# Patient Record
Sex: Male | Born: 1935 | Race: White | Hispanic: No | Smoking: Never smoker
Health system: Southern US, Community
[De-identification: ages and names within clinical notes are randomized; demographics above are authoritative.]

## PROBLEM LIST (undated history)

## (undated) DIAGNOSIS — J329 Chronic sinusitis, unspecified: Secondary | ICD-10-CM

## (undated) DIAGNOSIS — Z95810 Presence of automatic (implantable) cardiac defibrillator: Secondary | ICD-10-CM

## (undated) DIAGNOSIS — R05 Cough: Secondary | ICD-10-CM

## (undated) DIAGNOSIS — Z7901 Long term (current) use of anticoagulants: Secondary | ICD-10-CM

## (undated) DIAGNOSIS — I4891 Unspecified atrial fibrillation: Secondary | ICD-10-CM

## (undated) DIAGNOSIS — I951 Orthostatic hypotension: Secondary | ICD-10-CM

## (undated) DIAGNOSIS — G4733 Obstructive sleep apnea (adult) (pediatric): Secondary | ICD-10-CM

## (undated) DIAGNOSIS — N183 Chronic kidney disease, stage 3 unspecified: Secondary | ICD-10-CM

## (undated) DIAGNOSIS — M1990 Unspecified osteoarthritis, unspecified site: Secondary | ICD-10-CM

## (undated) DIAGNOSIS — I428 Other cardiomyopathies: Secondary | ICD-10-CM

## (undated) DIAGNOSIS — I10 Essential (primary) hypertension: Secondary | ICD-10-CM

## (undated) HISTORY — DX: Other cardiomyopathies: I42.8

## (undated) HISTORY — DX: Unspecified atrial fibrillation: I48.91

## (undated) HISTORY — DX: Presence of automatic (implantable) cardiac defibrillator: Z95.810

## (undated) HISTORY — DX: Long term (current) use of anticoagulants: Z79.01

## (undated) HISTORY — DX: Orthostatic hypotension: I95.1

## (undated) HISTORY — DX: Chronic sinusitis, unspecified: J32.9

## (undated) HISTORY — PX: CHOLECYSTECTOMY: SHX55

## (undated) HISTORY — DX: Cough: R05

## (undated) HISTORY — DX: Unspecified osteoarthritis, unspecified site: M19.90

## (undated) HISTORY — DX: Obstructive sleep apnea (adult) (pediatric): G47.33

## (undated) HISTORY — PX: TONSILLECTOMY: SUR1361

---

## 1983-05-11 HISTORY — PX: POSTERIOR LAMINECTOMY / DECOMPRESSION LUMBAR SPINE: SUR740

## 2000-05-10 DIAGNOSIS — G4733 Obstructive sleep apnea (adult) (pediatric): Secondary | ICD-10-CM

## 2000-05-10 HISTORY — DX: Obstructive sleep apnea (adult) (pediatric): G47.33

## 2000-06-21 ENCOUNTER — Encounter: Payer: Self-pay | Admitting: Internal Medicine

## 2001-06-26 ENCOUNTER — Ambulatory Visit (HOSPITAL_COMMUNITY): Admission: RE | Admit: 2001-06-26 | Discharge: 2001-06-26 | Payer: Self-pay | Admitting: Cardiology

## 2002-04-12 ENCOUNTER — Other Ambulatory Visit: Admission: RE | Admit: 2002-04-12 | Discharge: 2002-04-12 | Payer: Self-pay | Admitting: Dermatology

## 2003-08-16 ENCOUNTER — Ambulatory Visit (HOSPITAL_COMMUNITY): Admission: RE | Admit: 2003-08-16 | Discharge: 2003-08-16 | Payer: Self-pay | Admitting: Family Medicine

## 2003-09-10 ENCOUNTER — Ambulatory Visit (HOSPITAL_COMMUNITY): Admission: RE | Admit: 2003-09-10 | Discharge: 2003-09-10 | Payer: Self-pay | Admitting: Internal Medicine

## 2005-01-26 ENCOUNTER — Ambulatory Visit: Payer: Self-pay | Admitting: *Deleted

## 2005-01-28 ENCOUNTER — Ambulatory Visit (HOSPITAL_COMMUNITY): Admission: RE | Admit: 2005-01-28 | Discharge: 2005-01-28 | Payer: Self-pay | Admitting: *Deleted

## 2005-01-28 ENCOUNTER — Ambulatory Visit: Payer: Self-pay | Admitting: Cardiology

## 2005-02-11 ENCOUNTER — Ambulatory Visit: Payer: Self-pay | Admitting: *Deleted

## 2005-12-03 ENCOUNTER — Ambulatory Visit (HOSPITAL_COMMUNITY): Admission: RE | Admit: 2005-12-03 | Discharge: 2005-12-03 | Payer: Self-pay | Admitting: Family Medicine

## 2006-02-09 ENCOUNTER — Ambulatory Visit: Payer: Self-pay | Admitting: Cardiology

## 2006-04-13 ENCOUNTER — Ambulatory Visit: Payer: Self-pay | Admitting: Cardiology

## 2007-02-27 ENCOUNTER — Ambulatory Visit (HOSPITAL_COMMUNITY): Admission: RE | Admit: 2007-02-27 | Discharge: 2007-02-27 | Payer: Self-pay | Admitting: Family Medicine

## 2007-04-24 ENCOUNTER — Ambulatory Visit (HOSPITAL_COMMUNITY): Admission: RE | Admit: 2007-04-24 | Discharge: 2007-04-24 | Payer: Self-pay | Admitting: Family Medicine

## 2007-04-28 ENCOUNTER — Ambulatory Visit: Payer: Self-pay | Admitting: Cardiology

## 2007-05-01 ENCOUNTER — Ambulatory Visit: Payer: Self-pay | Admitting: Internal Medicine

## 2007-05-01 ENCOUNTER — Ambulatory Visit (HOSPITAL_COMMUNITY): Admission: RE | Admit: 2007-05-01 | Discharge: 2007-05-01 | Payer: Self-pay | Admitting: Cardiology

## 2007-05-12 ENCOUNTER — Ambulatory Visit (HOSPITAL_COMMUNITY): Admission: RE | Admit: 2007-05-12 | Discharge: 2007-05-12 | Payer: Self-pay | Admitting: Cardiology

## 2007-05-15 ENCOUNTER — Ambulatory Visit: Payer: Self-pay | Admitting: Cardiology

## 2007-06-11 DIAGNOSIS — Z95810 Presence of automatic (implantable) cardiac defibrillator: Secondary | ICD-10-CM

## 2007-06-11 HISTORY — DX: Presence of automatic (implantable) cardiac defibrillator: Z95.810

## 2007-06-11 HISTORY — PX: CARDIAC DEFIBRILLATOR PLACEMENT: SHX171

## 2007-06-15 ENCOUNTER — Ambulatory Visit: Payer: Self-pay | Admitting: Cardiology

## 2007-06-20 ENCOUNTER — Ambulatory Visit: Payer: Self-pay | Admitting: Internal Medicine

## 2007-07-07 ENCOUNTER — Observation Stay (HOSPITAL_COMMUNITY): Admission: RE | Admit: 2007-07-07 | Discharge: 2007-07-08 | Payer: Self-pay | Admitting: Internal Medicine

## 2007-07-07 ENCOUNTER — Ambulatory Visit: Payer: Self-pay | Admitting: Internal Medicine

## 2007-07-18 ENCOUNTER — Ambulatory Visit: Payer: Self-pay | Admitting: Cardiology

## 2007-07-24 ENCOUNTER — Ambulatory Visit: Payer: Self-pay | Admitting: Internal Medicine

## 2007-08-08 ENCOUNTER — Ambulatory Visit: Payer: Self-pay | Admitting: Cardiology

## 2007-09-13 ENCOUNTER — Ambulatory Visit: Payer: Self-pay | Admitting: Cardiology

## 2007-10-10 ENCOUNTER — Ambulatory Visit: Payer: Self-pay | Admitting: Internal Medicine

## 2007-10-18 ENCOUNTER — Ambulatory Visit: Payer: Self-pay | Admitting: Cardiology

## 2007-12-28 ENCOUNTER — Ambulatory Visit: Payer: Self-pay | Admitting: Cardiology

## 2008-01-11 ENCOUNTER — Ambulatory Visit: Payer: Self-pay | Admitting: Cardiology

## 2008-01-30 ENCOUNTER — Ambulatory Visit: Payer: Self-pay | Admitting: Cardiology

## 2008-02-06 ENCOUNTER — Encounter: Payer: Self-pay | Admitting: Orthopedic Surgery

## 2008-02-07 ENCOUNTER — Ambulatory Visit: Payer: Self-pay | Admitting: Internal Medicine

## 2008-02-07 ENCOUNTER — Ambulatory Visit: Payer: Self-pay | Admitting: Orthopedic Surgery

## 2008-02-14 ENCOUNTER — Emergency Department (HOSPITAL_COMMUNITY): Admission: EM | Admit: 2008-02-14 | Discharge: 2008-02-14 | Payer: Self-pay | Admitting: Emergency Medicine

## 2008-02-27 ENCOUNTER — Ambulatory Visit: Payer: Self-pay | Admitting: Cardiology

## 2008-03-15 ENCOUNTER — Encounter (INDEPENDENT_AMBULATORY_CARE_PROVIDER_SITE_OTHER): Payer: Self-pay | Admitting: General Surgery

## 2008-03-15 ENCOUNTER — Inpatient Hospital Stay (HOSPITAL_COMMUNITY): Admission: RE | Admit: 2008-03-15 | Discharge: 2008-03-16 | Payer: Self-pay | Admitting: General Surgery

## 2008-05-06 ENCOUNTER — Ambulatory Visit: Payer: Self-pay | Admitting: Internal Medicine

## 2008-06-11 ENCOUNTER — Ambulatory Visit: Payer: Self-pay | Admitting: Cardiology

## 2008-07-05 ENCOUNTER — Encounter: Payer: Self-pay | Admitting: Internal Medicine

## 2008-07-10 ENCOUNTER — Ambulatory Visit: Payer: Self-pay | Admitting: Internal Medicine

## 2008-08-23 ENCOUNTER — Ambulatory Visit (HOSPITAL_COMMUNITY): Admission: RE | Admit: 2008-08-23 | Discharge: 2008-08-23 | Payer: Self-pay | Admitting: Family Medicine

## 2008-10-07 ENCOUNTER — Ambulatory Visit: Payer: Self-pay | Admitting: Internal Medicine

## 2008-10-17 ENCOUNTER — Encounter: Payer: Self-pay | Admitting: Internal Medicine

## 2008-11-14 ENCOUNTER — Encounter (INDEPENDENT_AMBULATORY_CARE_PROVIDER_SITE_OTHER): Payer: Self-pay | Admitting: *Deleted

## 2008-12-09 ENCOUNTER — Encounter: Payer: Self-pay | Admitting: Cardiology

## 2008-12-11 ENCOUNTER — Ambulatory Visit (HOSPITAL_COMMUNITY): Admission: RE | Admit: 2008-12-11 | Discharge: 2008-12-11 | Payer: Self-pay | Admitting: Cardiology

## 2008-12-11 ENCOUNTER — Ambulatory Visit: Payer: Self-pay | Admitting: Cardiology

## 2008-12-11 ENCOUNTER — Encounter: Payer: Self-pay | Admitting: Cardiology

## 2008-12-11 DIAGNOSIS — I428 Other cardiomyopathies: Secondary | ICD-10-CM | POA: Insufficient documentation

## 2008-12-11 DIAGNOSIS — I10 Essential (primary) hypertension: Secondary | ICD-10-CM | POA: Insufficient documentation

## 2008-12-11 DIAGNOSIS — M1990 Unspecified osteoarthritis, unspecified site: Secondary | ICD-10-CM | POA: Insufficient documentation

## 2008-12-12 ENCOUNTER — Encounter: Payer: Self-pay | Admitting: Cardiology

## 2008-12-16 ENCOUNTER — Encounter (INDEPENDENT_AMBULATORY_CARE_PROVIDER_SITE_OTHER): Payer: Self-pay

## 2008-12-18 ENCOUNTER — Ambulatory Visit: Payer: Self-pay | Admitting: Cardiology

## 2009-01-26 ENCOUNTER — Encounter (INDEPENDENT_AMBULATORY_CARE_PROVIDER_SITE_OTHER): Payer: Self-pay | Admitting: Pediatrics

## 2009-01-27 ENCOUNTER — Ambulatory Visit: Payer: Self-pay | Admitting: Internal Medicine

## 2009-01-31 ENCOUNTER — Ambulatory Visit: Payer: Self-pay | Admitting: Cardiology

## 2009-01-31 ENCOUNTER — Encounter (INDEPENDENT_AMBULATORY_CARE_PROVIDER_SITE_OTHER): Payer: Self-pay | Admitting: *Deleted

## 2009-01-31 ENCOUNTER — Encounter: Payer: Self-pay | Admitting: Internal Medicine

## 2009-01-31 LAB — CONVERTED CEMR LAB
ALT: 12 units/L (ref 0–53)
AST: 19 units/L (ref 0–37)
Albumin: 4.4 g/dL (ref 3.5–5.2)
Alkaline Phosphatase: 68 units/L
BUN: 34 mg/dL
CO2: 24 meq/L
Calcium: 9.6 mg/dL
Creatinine, Ser: 1.46 mg/dL
Creatinine, Ser: 1.46 mg/dL (ref 0.40–1.50)
Glucose, Bld: 90 mg/dL (ref 70–99)
Potassium: 4.9 meq/L
Total Bilirubin: 0.6 mg/dL (ref 0.3–1.2)

## 2009-02-04 ENCOUNTER — Ambulatory Visit: Payer: Self-pay | Admitting: Cardiology

## 2009-02-04 ENCOUNTER — Encounter: Payer: Self-pay | Admitting: Cardiology

## 2009-02-04 ENCOUNTER — Ambulatory Visit (HOSPITAL_COMMUNITY): Admission: RE | Admit: 2009-02-04 | Discharge: 2009-02-04 | Payer: Self-pay | Admitting: Cardiology

## 2009-02-07 ENCOUNTER — Encounter (INDEPENDENT_AMBULATORY_CARE_PROVIDER_SITE_OTHER): Payer: Self-pay | Admitting: *Deleted

## 2009-02-10 ENCOUNTER — Encounter (INDEPENDENT_AMBULATORY_CARE_PROVIDER_SITE_OTHER): Payer: Self-pay | Admitting: *Deleted

## 2009-04-09 DIAGNOSIS — I4891 Unspecified atrial fibrillation: Secondary | ICD-10-CM

## 2009-04-09 HISTORY — DX: Unspecified atrial fibrillation: I48.91

## 2009-04-27 ENCOUNTER — Encounter: Payer: Self-pay | Admitting: Internal Medicine

## 2009-04-28 ENCOUNTER — Ambulatory Visit: Payer: Self-pay | Admitting: Internal Medicine

## 2009-05-05 ENCOUNTER — Encounter: Payer: Self-pay | Admitting: Internal Medicine

## 2009-05-06 ENCOUNTER — Telehealth: Payer: Self-pay | Admitting: Internal Medicine

## 2009-07-16 ENCOUNTER — Ambulatory Visit: Payer: Self-pay | Admitting: Internal Medicine

## 2009-07-24 ENCOUNTER — Encounter (INDEPENDENT_AMBULATORY_CARE_PROVIDER_SITE_OTHER): Payer: Self-pay | Admitting: *Deleted

## 2009-07-29 ENCOUNTER — Ambulatory Visit: Payer: Self-pay | Admitting: Cardiology

## 2009-07-29 ENCOUNTER — Encounter (INDEPENDENT_AMBULATORY_CARE_PROVIDER_SITE_OTHER): Payer: Self-pay | Admitting: *Deleted

## 2009-08-01 ENCOUNTER — Encounter (INDEPENDENT_AMBULATORY_CARE_PROVIDER_SITE_OTHER): Payer: Self-pay | Admitting: *Deleted

## 2009-08-01 ENCOUNTER — Encounter: Payer: Self-pay | Admitting: Cardiology

## 2009-08-01 LAB — CONVERTED CEMR LAB
AST: 21 units/L
Alkaline Phosphatase: 66 units/L
Basophils Absolute: 0 10*3/uL
Calcium: 9.5 mg/dL
Cholesterol: 146 mg/dL
Creatinine, Ser: 1.32 mg/dL
Eosinophils Absolute: 0.2 10*3/uL
Glucose, Bld: 102 mg/dL
HCT: 40.7 %
Lymphocytes Relative: 35 %
Lymphs Abs: 2.5 10*3/uL
Monocytes Relative: 10 %
RDW: 13.1 %
Total Protein: 7.2 g/dL
Triglycerides: 109 mg/dL
WBC: 7.1 10*3/uL

## 2009-08-06 ENCOUNTER — Encounter: Payer: Self-pay | Admitting: Cardiology

## 2009-08-06 LAB — CONVERTED CEMR LAB
ALT: 14 units/L (ref 0–53)
AST: 21 units/L (ref 0–37)
Albumin: 4.5 g/dL (ref 3.5–5.2)
Alkaline Phosphatase: 66 units/L (ref 39–117)
Basophils Relative: 0 % (ref 0–1)
Calcium: 9.5 mg/dL (ref 8.4–10.5)
Chloride: 106 meq/L (ref 96–112)
Eosinophils Absolute: 0.2 10*3/uL (ref 0.0–0.7)
Eosinophils Relative: 2 % (ref 0–5)
HDL: 27 mg/dL — ABNORMAL LOW (ref >39)
Hemoglobin: 13.1 g/dL (ref 13.0–17.0)
LDL Cholesterol: 97 mg/dL (ref 0–99)
MCHC: 32.2 g/dL (ref 30.0–36.0)
MCV: 104.6 fL — ABNORMAL HIGH (ref 78.0–100.0)
Neutro Abs: 3.7 10*3/uL (ref 1.7–7.7)
Neutrophils Relative %: 52 % (ref 43–77)
Platelets: 218 10*3/uL (ref 150–400)
RBC: 3.89 M/uL — ABNORMAL LOW (ref 4.22–5.81)
RDW: 13.1 % (ref 11.5–15.5)
Total Bilirubin: 0.5 mg/dL (ref 0.3–1.2)
Triglycerides: 109 mg/dL (ref <150)
VLDL: 22 mg/dL (ref 0–40)

## 2009-08-11 ENCOUNTER — Encounter: Payer: Self-pay | Admitting: Cardiology

## 2009-08-11 ENCOUNTER — Encounter (INDEPENDENT_AMBULATORY_CARE_PROVIDER_SITE_OTHER): Payer: Self-pay

## 2009-08-12 ENCOUNTER — Telehealth: Payer: Self-pay | Admitting: Cardiology

## 2009-09-10 LAB — CONVERTED CEMR LAB
Calcium: 9.2 mg/dL (ref 8.4–10.5)
Chloride: 107 meq/L (ref 96–112)
Creatinine, Ser: 1.31 mg/dL (ref 0.40–1.50)
Potassium: 4.8 meq/L (ref 3.5–5.3)

## 2009-09-15 ENCOUNTER — Encounter (INDEPENDENT_AMBULATORY_CARE_PROVIDER_SITE_OTHER): Payer: Self-pay | Admitting: *Deleted

## 2009-09-17 ENCOUNTER — Encounter (INDEPENDENT_AMBULATORY_CARE_PROVIDER_SITE_OTHER): Payer: Self-pay | Admitting: *Deleted

## 2009-09-17 LAB — CONVERTED CEMR LAB
ALT: 13 units/L
AST: 20 units/L
AST: 20 units/L
Albumin: 4.4 g/dL
Alkaline Phosphatase: 71 units/L
Alkaline Phosphatase: 71 units/L
BUN: 26 mg/dL
CO2: 27 meq/L
Chloride: 108 meq/L
Cholesterol: 159 mg/dL
Cholesterol: 159 mg/dL
Creatinine, Ser: 1.19 mg/dL
Creatinine, Ser: 1.19 mg/dL
Glucose, Bld: 105 mg/dL
HCT: 40.8 %
HDL: 34 mg/dL
Hemoglobin: 12.6 g/dL
Hemoglobin: 12.6 g/dL
LDL Cholesterol: 101 mg/dL
MCV: 104.1 fL
MCV: 104.1 fL
Platelets: 214 10*3/uL
Platelets: 214 10*3/uL
Sodium: 144 meq/L
Total Protein: 7.2 g/dL
Triglycerides: 120 mg/dL
WBC: 6.3 10*3/uL

## 2009-10-11 IMAGING — CR DG CHEST 2V
2 series · 2 of 2 positions shown · non-contrast
Comparison: 05/12/07.

CLINICAL DATA: Left ventricular dysfunction status post pacer placement. 
 CHEST - 2 VIEW:

[w chest pa]
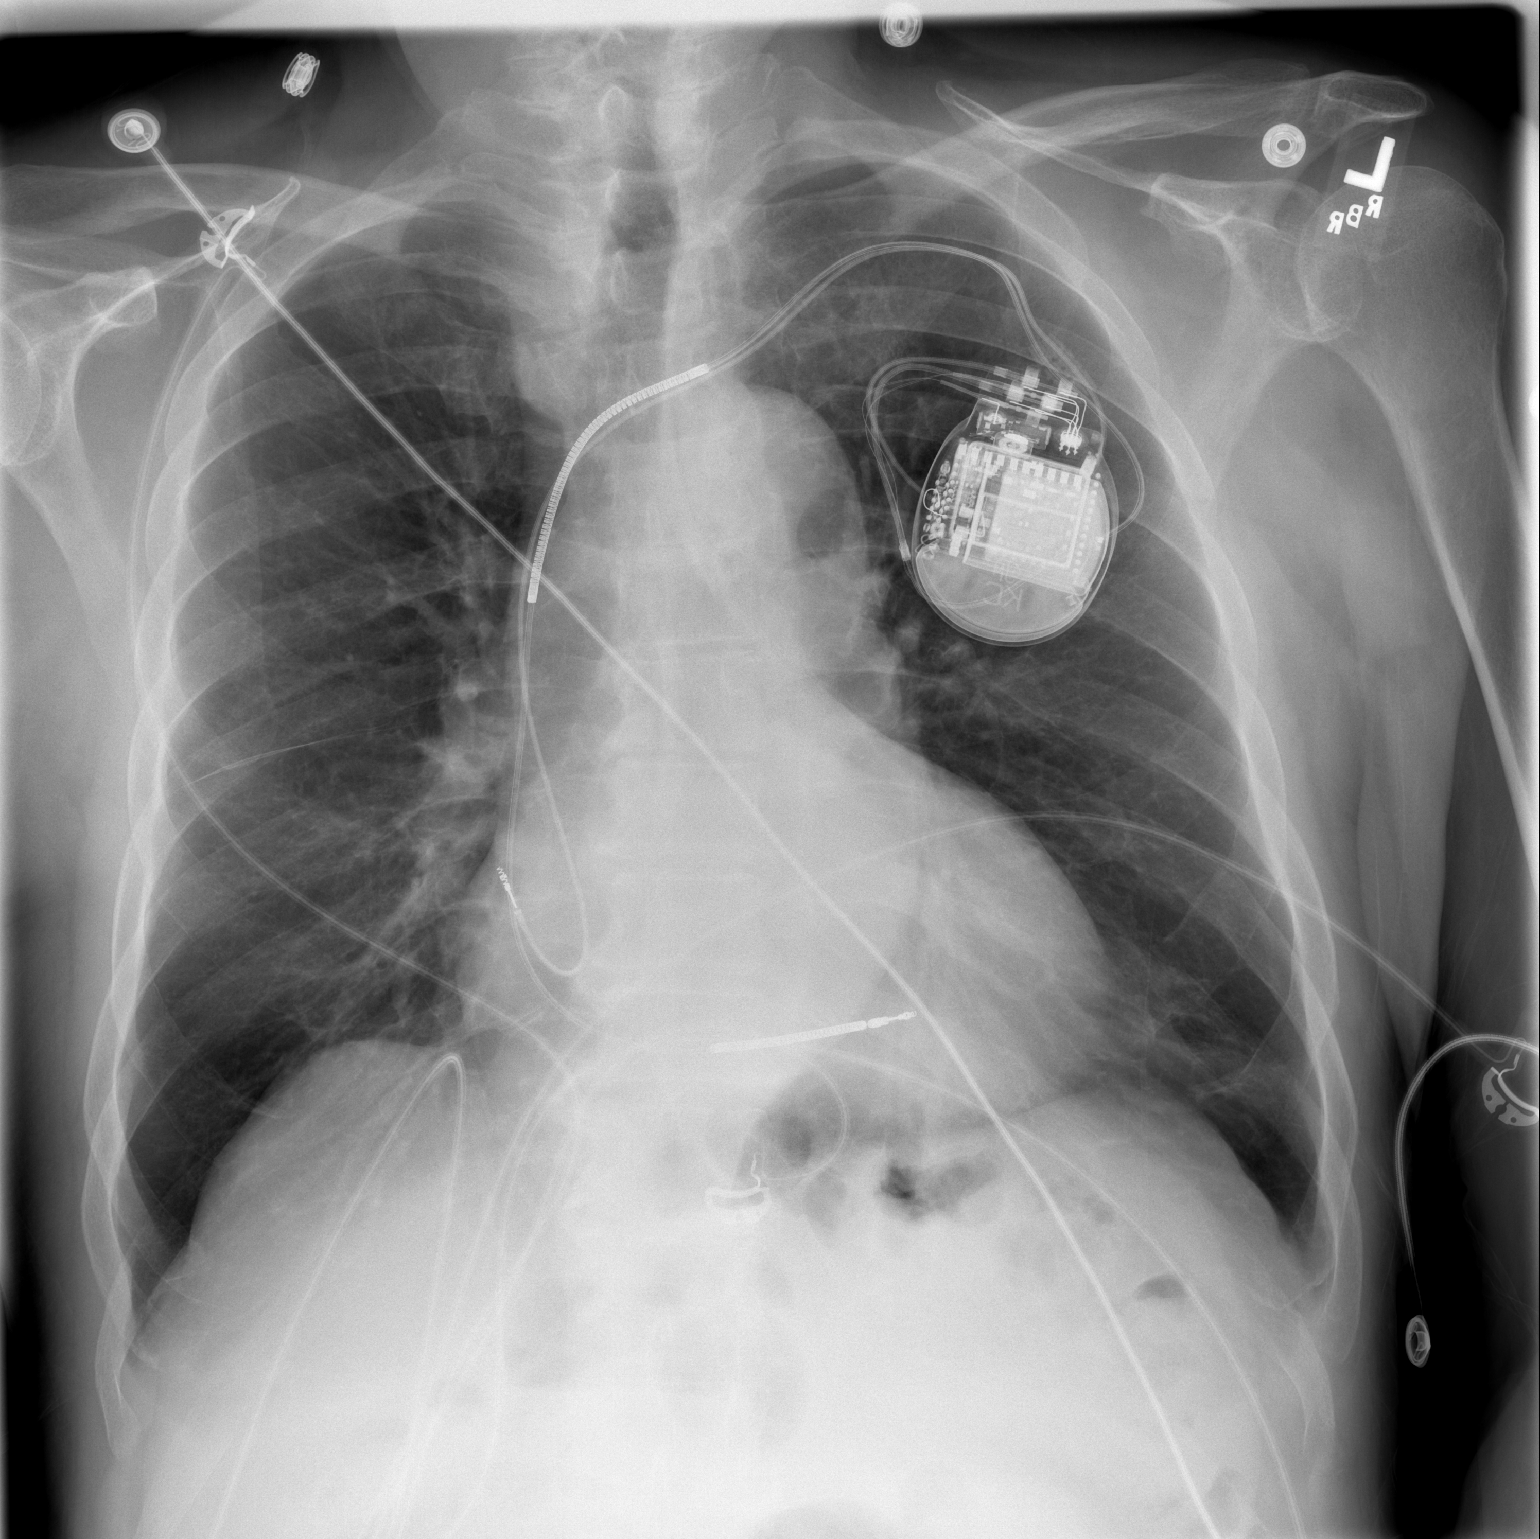

[w chest lat]
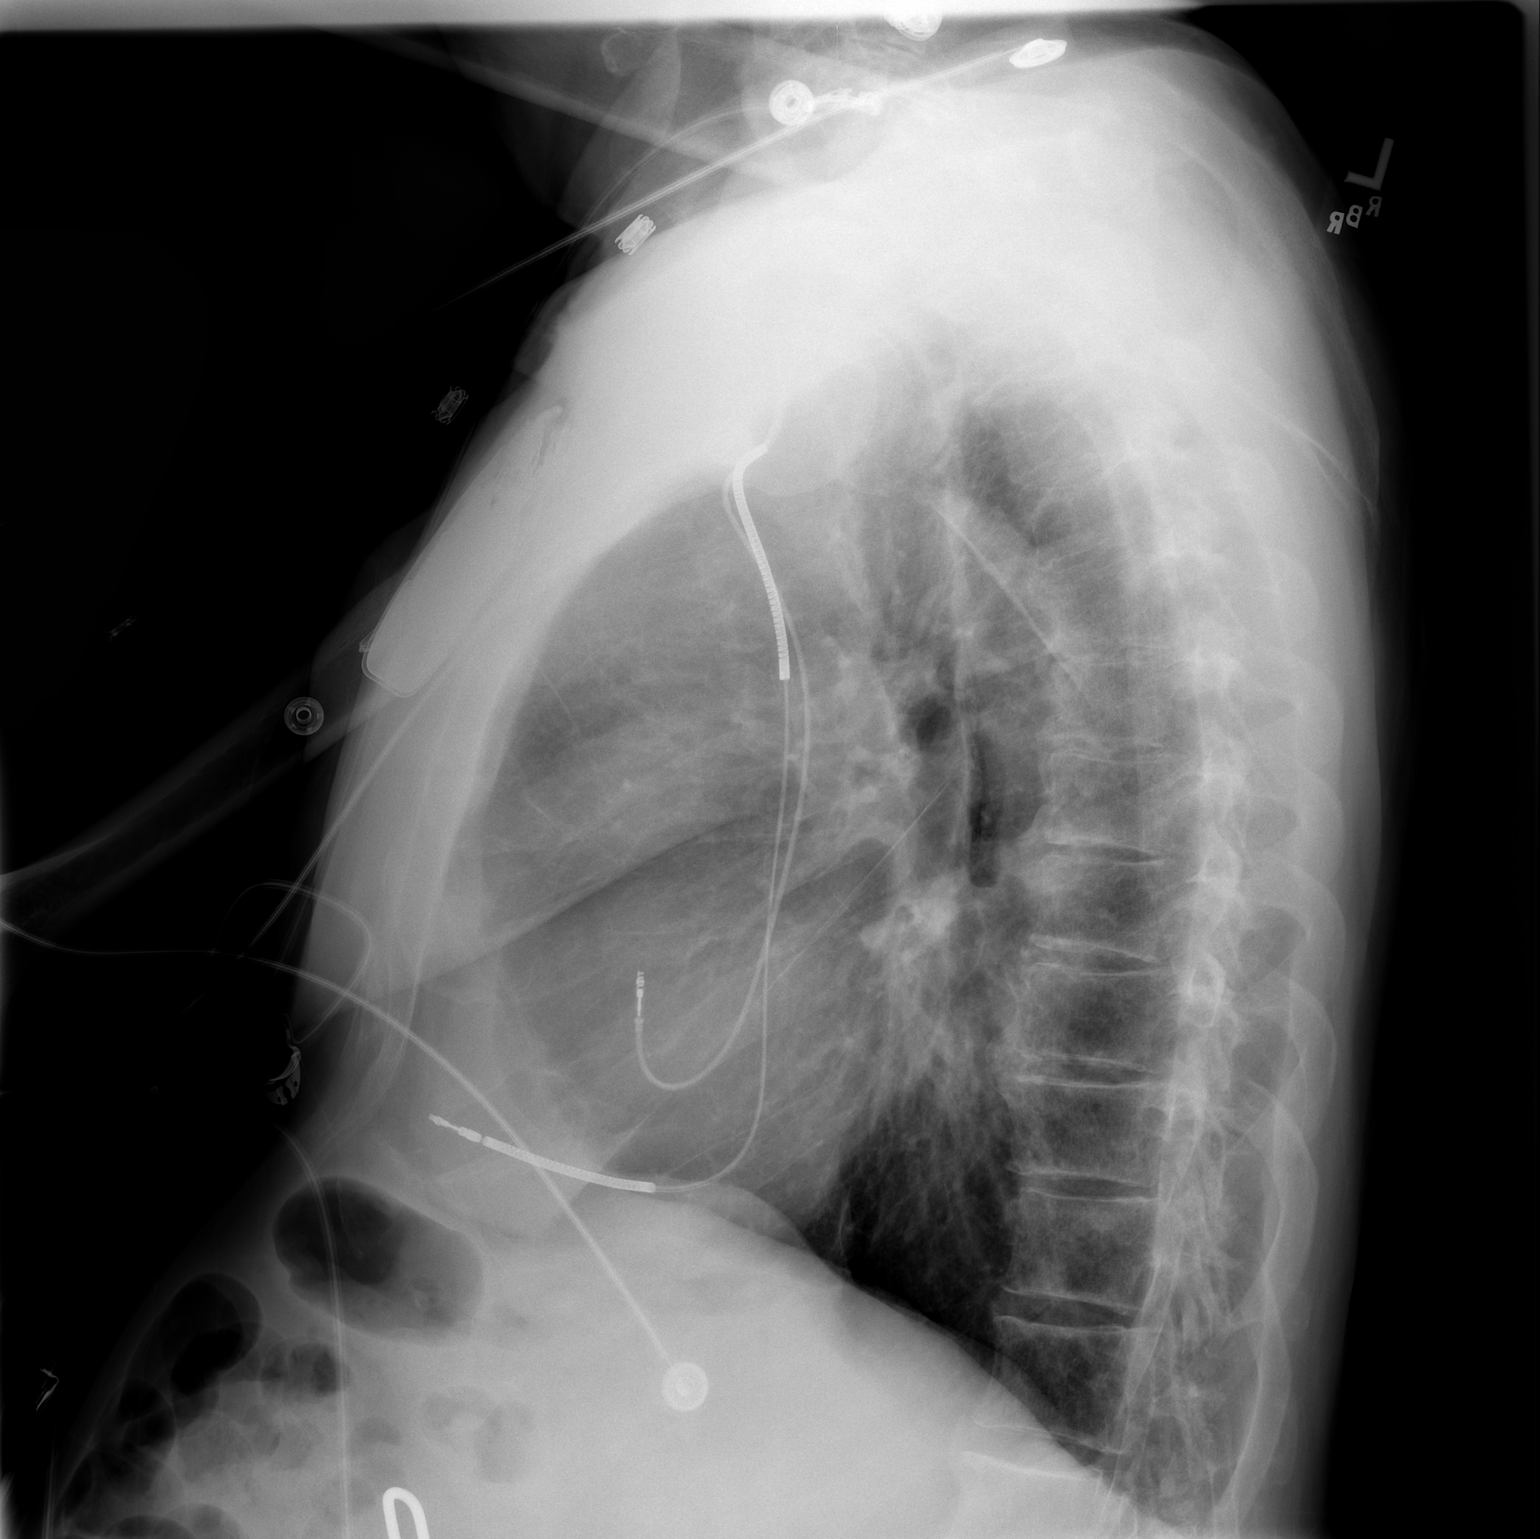

[2 of 2 positions shown; findings below may reference images not displayed]

FINDINGS: Left chest wall pacer device is noted with lead in the right atrial appendage and right ventricle.  No complication after pacer placement is noted. 
 Cardiac enlargement is noted.  There is no pleural fluid or pulmonary edema.  There is interstitial change consistent with COPD/emphysema.
IMPRESSION: 1.  No complication after pacer placement. 
 2.  Cardiac enlargement without heart failure.  
 3.  COPD.

## 2009-10-12 ENCOUNTER — Encounter: Payer: Self-pay | Admitting: Internal Medicine

## 2009-10-13 ENCOUNTER — Ambulatory Visit: Payer: Self-pay | Admitting: Internal Medicine

## 2009-10-28 ENCOUNTER — Encounter: Payer: Self-pay | Admitting: Internal Medicine

## 2009-12-24 ENCOUNTER — Encounter (INDEPENDENT_AMBULATORY_CARE_PROVIDER_SITE_OTHER): Payer: Self-pay | Admitting: *Deleted

## 2009-12-24 LAB — CONVERTED CEMR LAB
BUN: 42 mg/dL
Calcium: 9.3 mg/dL
Glucose, Bld: 85 mg/dL
Potassium: 4.7 meq/L
Sodium: 144 meq/L

## 2010-01-21 ENCOUNTER — Ambulatory Visit: Payer: Self-pay | Admitting: Internal Medicine

## 2010-01-23 ENCOUNTER — Telehealth (INDEPENDENT_AMBULATORY_CARE_PROVIDER_SITE_OTHER): Payer: Self-pay | Admitting: *Deleted

## 2010-03-18 ENCOUNTER — Encounter (INDEPENDENT_AMBULATORY_CARE_PROVIDER_SITE_OTHER): Payer: Self-pay | Admitting: *Deleted

## 2010-03-25 ENCOUNTER — Ambulatory Visit: Payer: Self-pay | Admitting: Cardiology

## 2010-03-26 ENCOUNTER — Encounter: Payer: Self-pay | Admitting: Cardiology

## 2010-03-26 LAB — CONVERTED CEMR LAB
ALT: 11 units/L (ref 0–53)
Albumin: 4.3 g/dL (ref 3.5–5.2)
Alkaline Phosphatase: 67 units/L (ref 39–117)
Calcium: 9.3 mg/dL (ref 8.4–10.5)
Sodium: 143 meq/L (ref 135–145)
Total Bilirubin: 0.8 mg/dL (ref 0.3–1.2)

## 2010-03-30 ENCOUNTER — Encounter (INDEPENDENT_AMBULATORY_CARE_PROVIDER_SITE_OTHER): Payer: Self-pay

## 2010-03-30 LAB — CONVERTED CEMR LAB
BUN: 40 mg/dL
CO2: 26 meq/L
Chloride: 108 meq/L
Creatinine, Ser: 1.37 mg/dL
Potassium: 4.6 meq/L
Sodium: 142 meq/L

## 2010-04-23 ENCOUNTER — Ambulatory Visit: Payer: Self-pay | Admitting: Internal Medicine

## 2010-05-06 ENCOUNTER — Telehealth (INDEPENDENT_AMBULATORY_CARE_PROVIDER_SITE_OTHER): Payer: Self-pay

## 2010-05-07 ENCOUNTER — Ambulatory Visit: Payer: Self-pay | Admitting: Internal Medicine

## 2010-05-07 DIAGNOSIS — G4733 Obstructive sleep apnea (adult) (pediatric): Secondary | ICD-10-CM

## 2010-05-07 DIAGNOSIS — I5022 Chronic systolic (congestive) heart failure: Secondary | ICD-10-CM

## 2010-05-10 ENCOUNTER — Telehealth: Payer: Self-pay | Admitting: Internal Medicine

## 2010-05-13 ENCOUNTER — Ambulatory Visit
Admission: RE | Admit: 2010-05-13 | Discharge: 2010-05-13 | Payer: Self-pay | Source: Home / Self Care | Attending: Internal Medicine | Admitting: Internal Medicine

## 2010-05-13 ENCOUNTER — Encounter: Payer: Self-pay | Admitting: Internal Medicine

## 2010-05-25 ENCOUNTER — Encounter (INDEPENDENT_AMBULATORY_CARE_PROVIDER_SITE_OTHER): Payer: Self-pay | Admitting: *Deleted

## 2010-06-05 ENCOUNTER — Ambulatory Visit
Admission: RE | Admit: 2010-06-05 | Discharge: 2010-06-05 | Payer: Self-pay | Source: Home / Self Care | Attending: Internal Medicine | Admitting: Internal Medicine

## 2010-06-05 DIAGNOSIS — J310 Chronic rhinitis: Secondary | ICD-10-CM | POA: Insufficient documentation

## 2010-06-11 NOTE — Cardiovascular Report (Signed)
Summary: Office Visit Remote   Office Visit Remote   Imported By: Roderic Ovens 05/29/2010 10:35:28  _____________________________________________________________________  External Attachment:    Type:   Image     Comment:   External Document

## 2010-06-11 NOTE — Miscellaneous (Signed)
Summary: cbc,lipids,cmp  Clinical Lists Changes  Observations: Added new observation of CALCIUM: 9.2 mg/dL (54/01/8118 14:78) Added new observation of ALBUMIN: 4.4 g/dL (29/56/2130 86:57) Added new observation of PROTEIN, TOT: 7.2 g/dL (84/69/6295 28:41) Added new observation of SGPT (ALT): 13 units/L (09/17/2009 13:21) Added new observation of SGOT (AST): 20 units/L (09/17/2009 13:21) Added new observation of ALK PHOS: 71 units/L (09/17/2009 13:21) Added new observation of BILI DIRECT: total bili  0.6 mg/dL (32/44/0102 72:53) Added new observation of CREATININE: 1.19 mg/dL (66/44/0347 42:59) Added new observation of BUN: 26 mg/dL (56/38/7564 33:29) Added new observation of BG RANDOM: 105 mg/dL (51/88/4166 06:30) Added new observation of CO2 PLSM/SER: 27 meq/L (09/17/2009 13:21) Added new observation of CL SERUM: 108 meq/L (09/17/2009 13:21) Added new observation of K SERUM: 5.0 meq/L (09/17/2009 13:21) Added new observation of NA: 144 meq/L (09/17/2009 13:21) Added new observation of LDL: 101 mg/dL (16/05/930 35:57) Added new observation of HDL: 34 mg/dL (32/20/2542 70:62) Added new observation of TRIGLYC TOT: 120 mg/dL (37/62/8315 17:61) Added new observation of CHOLESTEROL: 159 mg/dL (60/73/7106 26:94) Added new observation of PLATELETK/UL: 214 K/uL (09/17/2009 13:21) Added new observation of MCV: 104.1 fL (09/17/2009 13:21) Added new observation of HCT: 40.8 % (09/17/2009 13:21) Added new observation of HGB: 12.6 g/dL (85/46/2703 50:09) Added new observation of WBC COUNT: 6.3 10*3/microliter (09/17/2009 13:21)

## 2010-06-11 NOTE — Miscellaneous (Signed)
Summary: BMP  Clinical Lists Changes  Observations: Added new observation of CALCIUM: 9.4 mg/dL (16/02/9603 54:09) Added new observation of CREATININE: 1.37 mg/dL (81/19/1478 29:56) Added new observation of BUN: 40 mg/dL (21/30/8657 84:69) Added new observation of BG RANDOM: 101 mg/dL (62/95/2841 32:44) Added new observation of CO2 PLSM/SER: 26 meq/L (03/30/2010 13:19) Added new observation of CL SERUM: 108 meq/L (03/30/2010 13:19) Added new observation of K SERUM: 4.6 meq/L (03/30/2010 13:19) Added new observation of NA: 142 meq/L (03/30/2010 13:19)

## 2010-06-11 NOTE — Progress Notes (Signed)
Summary: CPAP has been 11 cwp  Phone Note Other Incoming   Summary of Call: CPAP 11 cwp, [facility] Initial call taken by: Waymon Budge MD,  May 10, 2010 7:24 PM    New/Updated Medications: * CPAP 11  [facility]

## 2010-06-11 NOTE — Assessment & Plan Note (Signed)
Summary: sleep apnea///JJ   Primary Provider/Referring Provider:  Dr. Carylon Perches  CC:  Sleep Consult-sleep apnea Dr. Carylon Perches.Marland Kitchen  History of Present Illness: May 07, 2010- 81yoM followed by Dr Ouida Sills and referred now for sleep apnea. Wife is with him. Never smoker, followed by Dr Dietrich Pates for cardiomyopathy caused dyspnea on exertion, now improved with treatment. . Separate beds because of his snoring. Fights sleepiness driving.  NPSG at [facility] 05/21/00 with RDI 51/hr. Oxygenation was normal range. Titrated to CPAP 7. He has used CPAP every night since then, but not reassessed. He says he is comfortable with it, but rolls a lot in sleep and tube gets wrapped around and the air flow noise is bothersome.  Bedtime 10-11PM, latency 5 minutes, waking 1-2 times before finally up 530AM. Weight has varied.  Denies wheeze, cough, chest pain. Has AICD. Denies seasonal rhinitis or edema. Denies airway problems associated with Lisinopril. Legs give out before dyspnea limits him walking.   Preventive Screening-Counseling & Management  Alcohol-Tobacco     Smoking Status: never  Current Medications (verified): 1)  Furosemide 40 Mg Tabs (Furosemide) .... Take One and One Half  Tablets By Mouth Daily. 2)  Aldactone 25 Mg Tabs (Spironolactone) .Marland Kitchen.. 1 Tab Once Daily 3)  Carvedilol 25 Mg Tabs (Carvedilol) .... Take One Half Tablet Two Times A Day 4)  Lisinopril 20 Mg Tabs (Lisinopril) .... Take One Half Tablet Daily 5)  Daily Multiple Vitamins  Tabs (Multiple Vitamin) .... Prn 6)  Aspir-Trin 325 Mg Tbec (Aspirin) .... Take 1 Tab Daily 7)  Cpap  [facility]  Allergies (verified): No Known Drug Allergies  Past History:  Past Surgical History: Last updated: 08-09-2009 Tonsillectomy Pacemaker implantation-06/2007 Lumbosacral discectomy and fusion- Dr. 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  The patient denies productive cough, non-productive cough, coughing up blood, chest pain, acid heartburn, indigestion, loss of appetite, weight change, abdominal pain, difficulty swallowing, sore throat, tooth/dental problems, headaches, nasal congestion/difficulty breathing through nose, sneezing, itching, ear ache, anxiety, depression, joint stiffness or pain, rash, change in color of mucus, and fever.    Vital Signs:  Patient profile:   77 year old male Height:      68 inches Weight:      185.13 pounds BMI:     28.25 O2 Sat:      96 % on Room air Pulse rate:   58 / minute BP sitting:   114 / 66  (right arm) Cuff size:   regular  Vitals Entered By: Florentina Addison  Welchel CMA (May 07, 2010 10:45 AM)  O2 Flow:  Room air CC: Sleep Consult-sleep apnea Dr. Carylon Perches.   Physical Exam  Additional Exam:  General: A/Ox3; pleasant and  cooperative, NAD, mild overweight SKIN: no rash, lesions NODES: no lymphadenopathy HEENT: [place]/AT, EOM- WNL, Conjuctivae- clear, PERRLA, TM-WNL, Nose- clear, Throat- clear and wnl, Mallampati  III NECK: Supple w/ fair ROM, JVD- none, normal carotid impulses w/o bruits Thyroid- normal to palpation CHEST: Clear to P&A, diminsihed, no rales  HEART: RRR- almost regular, no m/g/r heard ABDOMEN: Soft and nl; nml bowel sounds; no organomegaly or masses noted ZOX:WRUE, nl pulses, no edema  NEURO: Grossly intact to observation      Impression & Recommendations:  Problem # 1:  DYSPNEA ON EXERTION (ICD-786.09)  He never smoked and has no awareness of separate lung disease. He tells me this has improved under Dr Marvel Plan care, suggesting it is due to his cardiomyopathy. We will get PFT.  His updated medication list for this problem includes:    Furosemide 40 Mg Tabs (Furosemide) .Marland Kitchen... Take one and one half  tablets by mouth daily.    Aldactone 25 Mg Tabs (Spironolactone) .Marland Kitchen... 1 tab once daily    Carvedilol 25 Mg Tabs (Carvedilol) .Marland Kitchen... Take one half tablet two times a day    Lisinopril 20 Mg Tabs (Lisinopril) .Marland Kitchen... Take one half tablet daily  Problem # 2:  Hx of HYPERSOMNIA WITH SLEEP APNEA UNSPECIFIED (ICD-780.53)  We are trying to find his original diagnostic sleep study. meanwhile we will ask [facility] to do autotitration for pressure evaluation and assess mask fit.   Medications Added to Medication List This Visit: 1)  Cpap [facility]   Other Orders: Consultation Level IV [phone]) Misc. Referral (Misc. Ref) DME Referral (DME)  Patient Instructions: 1)  Please schedule a follow-up appointment in 1 month. 2)  See [facility] to set up CPAP autotitration and PFT 3)  cc Dr Ouida Sills, Dr Dietrich Pates

## 2010-06-11 NOTE — Assessment & Plan Note (Signed)
Summary: 1 month follow up//jwr   Primary Provider/Referring Provider:  Dr. Carylon Perches  CC:  1 mth rov - f/u on pft and cpap titration.  History of Present Illness: History of Present Illness: May 07, 2010- 77yoM followed by Dr Ouida Sills and referred now for sleep apnea. Wife is with him. Never smoker, followed by Dr Dietrich Pates for cardiomyopathy caused dyspnea on exertion, now improved with treatment. . Separate beds because of his snoring. Fights sleepiness driving.  NPSG at [facility] 05/21/00 with RDI 51/hr. Oxygenation was normal range. Titrated to CPAP 7. He has used CPAP every night since then, but not reassessed. He says he is comfortable with it, but rolls a lot in sleep and tube gets wrapped around and the air flow noise is bothersome.  Bedtime 10-11PM, latency 5 minutes, waking 1-2 times before finally up 530AM. Weight has varied.  Denies wheeze, cough, chest pain. Has AICD. Denies seasonal rhinitis or edema. Denies airway problems associated with Lisinopril. Legs give out before dyspnea limits him walking.   June 05, 2010- OSA, AICD/ CM..................Marland Kitchenwife here Nurse-CC: 1 mth rov - f/u on pft and cpap titration PFT-  05/13/10- WNL 05/13/10- 99, 100, 100, 333 meters, no stops CPAP compliance good with AHI 7.1 on titrated pressure of 11 which is what he is using.  Bothersome rhinorhea when he starts eating. Dr Ouida Sills suggested claritin.      Preventive Screening-Counseling & Management  Alcohol-Tobacco     Smoking Status: never  Current Medications (verified): 1)  Furosemide 40 Mg Tabs (Furosemide) .... Take One and One Half  Tablets By Mouth Daily. 2)  Aldactone 25 Mg Tabs (Spironolactone) .Marland Kitchen.. 1 Tab Once Daily 3)  Carvedilol 25 Mg Tabs (Carvedilol) .... Take One Half Tablet Two Times A Day 4)  Lisinopril 20 Mg Tabs (Lisinopril) .... Take One Half Tablet Daily 5)  Daily Multiple Vitamins  Tabs (Multiple Vitamin) .... Prn 6)  Aspir-Trin 325 Mg Tbec (Aspirin) .... Take  1 Tab Daily 7)  Cpap 11  [facility]  Allergies (verified): No Known Drug Allergies  Comments:  Nurse/Medical Assistant: The patient's medications and allergies were reviewed with the patient and were updated in the Medication and Allergy Lists.  Past History:  Past Medical History: Last updated: 05/07/2010 Nonischemic cardiomyopathy; EF-45% in '03, 25% in '08, 25-30% in '11; AICD/dual-chamber pacemaker  (St. Jude)- 2/09;  CHF Atrial fibrillation-11 hour episode detected in 12/10 during AICD interrogation ASCVD, nonobstructive, in 1997 with a normal ejection fraction Hypertension Orthostatic hypotension Obstructive sleep apnea- 06/21/00- RDI 51/hr, CPAP DJD  Past Surgical History: Last updated: Aug 24, 2009 Tonsillectomy Pacemaker implantation-06/2007 Lumbosacral discectomy and fusion- Dr. 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  The patient denies shortness of breath at rest, productive cough, non-productive cough, coughing up blood, chest pain, irregular heartbeats, acid heartburn, indigestion, loss of appetite, weight change, abdominal pain, difficulty swallowing, sore throat, tooth/dental problems, and headaches.    Vital Signs:  Patient profile:  77 year old male Weight:      186.38 pounds O2 Sat:      99 % on Room air Pulse rate:   50 / minute BP  sitting:   100 / 60  (left arm) Cuff size:   regular  Vitals Entered By: Reynaldo Minium CMA (June 05, 2010 9:35 AM)  O2 Flow:  Room air  Physical Exam  Additional Exam:  General: A/Ox3; pleasant and cooperative, NAD, mild overweight SKIN: no rash, lesions NODES: no lymphadenopathy HEENT: [place]/AT, EOM- WNL, Conjuctivae- clear, PERRLA, TM-WNL, Nose- mucus bridging, Throat- clear and wnl, Mallampati  III NECK: Supple w/ fair ROM, JVD- none, normal carotid impulses w/o bruits Thyroid- normal to palpation CHEST: Clear to P&A, diminsihed, no rales  HEART: RRR-slow,  almost regular, no m/g/r heard ABDOMEN: overweight ZOX:WRUE, nl pulses, no edema  NEURO: Grossly intact to observation      Impression & Recommendations:  Problem # 1:  Hx of HYPERSOMNIA WITH SLEEP APNEA UNSPECIFIED (ICD-780.53)  God compliance and adequate control on CPAP. He is comfortable with pressure of 11 so we are going to leave him with that for now and watch over time. Weight loss and good sleep hygiene advised.   Problem # 2:  DYSPNEA ON EXERTION (ICD-786.09)  PFT and 6 MWT look very good, wit no evidence from these of any lung limitation to exertion. I think it is fair to attribute dyspnea to his cardiopmyopathy if other factors like anemia can be excluded. Some regular walking exercise is good.  His updated medication list for this problem includes:    Furosemide 40 Mg Tabs (Furosemide) .Marland Kitchen... Take one and one half  tablets by mouth daily.    Aldactone 25 Mg Tabs (Spironolactone) .Marland Kitchen... 1 tab once daily    Carvedilol 25 Mg Tabs (Carvedilol) .Marland Kitchen... Take one half tablet two times a day    Lisinopril 20 Mg Tabs (Lisinopril) .Marland Kitchen... Take one half tablet daily  Problem # 3:  RHINITIS (ICD-472.0)  Watery rhinorhea specifically associated with meals, now bothering him for at least 2-3 months. Claritin is easy if sufficient and I encouraged him to give it a try. Most likely this is a vasomotor reflex and will respond to  ipratropium used as needed. Discussion and script. he denies glaucoma and prostatism.   Medications Added to Medication List This Visit: 1)  Ipratropium Bromide 0.06 % Soln (Ipratropium bromide) .Marland Kitchen.. 1-2 puffs each nostril, up to 3 times daily before meals as needed  Other Orders: Est. Patient Level IV (45409)  Patient Instructions: 1)  Please schedule a follow-up appointment in 6 months. 2)  Continue  CPAP at 11 cwp. Please call sooner if needed.  3)  You can try Claritin/ loratadine as an antihistamine for your runny nose. 4)  If that doesn't work well enough, try script for ipratropium nasal spray. 5)  cc Dr Ouida Sills Prescriptions: IPRATROPIUM BROMIDE 0.06 % SOLN (IPRATROPIUM BROMIDE) 1-2 puffs each nostril, up to 3 times daily before meals as needed  #1 x prn   Entered and Authorized by:   Waymon Budge MD   Signed by:   Waymon Budge MD on 06/05/2010   Method used:   Print then Give to Patient   RxID:   [id number]    Immunization History:  Influenza Immunization History:    Influenza:  historical (01/21/2010)  Pneumovax Immunization History:    Pneumovax:  historical (02/25/2004)

## 2010-06-11 NOTE — Miscellaneous (Signed)
Summary: labs cmp 01/31/09   Clinical Lists Changes  Observations: Added new observation of CALCIUM: 9.6 mg/dL (16/02/9603 5:40) Added new observation of ALBUMIN: 4.4 g/dL (98/03/9146 8:29) Added new observation of PROTEIN, TOT: 6.9 g/dL (56/21/3086 5:78) Added new observation of SGPT (ALT): 12 units/L (01/31/2009 9:21) Added new observation of SGOT (AST): 19 units/L (01/31/2009 9:21) Added new observation of ALK PHOS: 68 units/L (01/31/2009 9:21) Added new observation of CREATININE: 1.46 mg/dL (46/96/2952 8:41) Added new observation of BUN: 34 mg/dL (32/44/0102 7:25) Added new observation of BG RANDOM: 90 mg/dL (36/64/4034 7:42) Added new observation of CO2 PLSM/SER: 24 meq/L (01/31/2009 9:21) Added new observation of CL SERUM: 108 meq/L (01/31/2009 9:21) Added new observation of K SERUM: 4.9 meq/L (01/31/2009 9:21) Added new observation of NA: 145 meq/L (01/31/2009 9:21)

## 2010-06-11 NOTE — Letter (Signed)
Summary: Handout Printed  Printed Handout:  - Diet - Potassium Content of Foods 

## 2010-06-11 NOTE — Cardiovascular Report (Signed)
Summary: Office Visit Remote   Office Visit Remote   Imported By: Roderic Ovens 11/01/2009 12:00:21  _____________________________________________________________________  External Attachment:    Type:   Image     Comment:   External Document

## 2010-06-11 NOTE — Assessment & Plan Note (Signed)
Summary: 8 mth f/u per checkout in 3/11/tg      Allergies Added: NKDA  Primary Provider:  Dr. Carylon Perches   History of Present Illness: [name] returns to the office as scheduled for continued assessment and treatment of a nonischemic cardiomyopathy.  Since his last visit, he has done extremely well.  He is relatively inactive, but develops no dyspnea nor chest discomfort with light work performed in the home.  He walks regularly, but has developed some knee discomfort and now exercises mostly on a stationary bike.  He has no orthopnea, PND nor peripheral edema.  He has not required urgent medical care nor has he been hospitalized.  His AICD is followed regularly by our electrophysiology service.  Occasional episodes of atrial fibrillation have been identified, one lasting approximately 20 hours.  Full anticoagulation was considered by Dr. Ladona Ridgel for these spells, but the patient remains on aspirin.  EKG  Procedure date:  03/25/2010  Findings:      Rhythm Strip  Sinus bradycardia at a rate of 58 bpm Frequent PVCs in a trigeminal pattern Frequent PACs   Current Medications (verified): 1)  Furosemide 40 Mg Tabs (Furosemide) .... Take One and One Half  Tablets By Mouth Daily. 2)  Aldactone 25 Mg Tabs (Spironolactone) .Marland Kitchen.. 1 Tab Once Daily 3)  Carvedilol 25 Mg Tabs (Carvedilol) .... Take One Half Tablet Two Times A Day 4)  Lisinopril 20 Mg Tabs (Lisinopril) .... Take One Half Tablet Daily 5)  Daily Multiple Vitamins  Tabs (Multiple Vitamin) .... Prn 6)  Aspir-Trin 325 Mg Tbec (Aspirin) .... Take 1 Tab Daily  Allergies (verified): No Known Drug Allergies  Past History:  PMH, FH, and Social History reviewed and updated.  Review of Systems       See history of present illness  Vital Signs:  Patient profile:   77 year old male Weight:      183 pounds Pulse rate:   60 / minute BP sitting:   100 / 58  (left arm)  Vitals Entered ByLarita Fife Via LPN (March 25, 2010 11:24  AM)  Physical Exam  General:  Mildly overweight; well developed; no acute distress:   Neck-No JVD; no carotid bruits Lungs-No tachypnea, no rales; no rhonchi; no wheezes: Well healed ICD incision. Cardiovascular-rhythm irregularity; normal PMI; normal S1 and S2: modest systolic murmur; no third heart sound; intermittent S4 Abdomen-BS normal; soft and non-tender without masses or organomegaly:  Musculoskeletal-No deformities, no cyanosis or clubbing: Neurologic-Normal cranial nerves; symmetric strength and tone:  Skin-Warm, no significant lesions: Extremities-Nl distal pulses; trace edema     ICD Specifications Following MD:  [name], MD     ICD Vendor:  St Jude     ICD Model Number:  [phone]     ICD Serial Number:  [id number] ICD DOI:  07/07/2007      Lead 1:    Location: RA     DOI: 07/07/2007     Model #: [id number]     Serial #: [id number]     Status: active Lead 2:    Location: RV     DOI: 07/07/2007     Model #: [id number]     Serial #: [id number]     Status: active  Indications::  NICM  Explantation Comments: Merlin  ICD Follow Up ICD Dependent:  No      Episodes Coumadin:  No  Brady Parameters Mode DDD     Lower Rate Limit:  60  Upper Rate Limit 110 PAV 250     Sensed AV Delay:  250  Tachy Zones VF:  240     VT:  200     VT1:  181     Impression & Recommendations:  Problem # 1:  CHRONIC SYSTOLIC HEART FAILURE (ICD-428.22) CHF is well compensated at the present time; current medications will be continued.  Laboratory studies will be repeated including a chemistry profile.  Patient encouraged to remain as active as possible while respecting the limitations imposed by his cardiac disease.  Problem # 2:  ATRIAL FIBRILLATION (ICD-427.31) Episodes have been very infrequent, although one was fairly prolonged.  With this pattern, his likelihood of suffering a thromboembolic event is quite low.  I agree that the risk-benefit ratio favors therapy with aspirin rather than full  anticoagulation.  Problem # 3:  CHRONIC KIDNEY DISEASE-STAGE II-III (ICD-585.9) Creatinine has increased over the past year, most recently to a level of nearly 1.8.  A repeat chemistry profile will be obtained.  Other Orders: T-Comprehensive Metabolic Panel [phone])  Patient Instructions: 1)  Your physician recommends that you schedule a follow-up appointment in: 10 months 2)  Your physician recommends that you return for lab work in: Today  Prevention & Chronic Care Immunizations   Influenza vaccine: Fluvax MCR  (01/13/2009)    Tetanus booster: Not documented    Pneumococcal vaccine: Not documented    H. zoster vaccine: Not documented  Colorectal Screening   Hemoccult: Not documented    Colonoscopy: Not documented  Other Screening   PSA: Not documented   Smoking status: never  (02/07/2008)  Lipids   Total Cholesterol: 159  (09/17/2009)   LDL: 101  (09/17/2009)   LDL Direct: Not documented   HDL: 34  (09/17/2009)   Triglycerides: 120  (09/17/2009)  Hypertension   Last Blood Pressure: 100 / 58  (03/25/2010)   Serum creatinine: 1.67  (12/24/2009)   Serum potassium 4.7  (12/24/2009) CMP ordered   Self-Management Support :    Hypertension self-management support: Not documented

## 2010-06-11 NOTE — Progress Notes (Signed)
**Note De-Identified Skarlette Lattner Obfuscation** Summary: refill   Phone Note Call from Patient   Caller: Patient Reason for Call: Refill Medication Summary of Call: states he needs refills on Aldactone and Coreg called to [facility] / tg Initial call taken by: Raechel Ache [facility],  May 06, 2010 4:05 PM    Prescriptions: CARVEDILOL 25 MG TABS (CARVEDILOL) take one half tablet two times a day  #30 x 9   Entered by:   Larita Fife Adileny Delon LPN   Authorized by:   Kathlen Brunswick, MD, [facility]   Signed by:   Larita Fife Tylor Courtwright LPN on 16/02/9603   Method used:   Electronically to        [facility] (retail)       [address]       [place], [place]  [address]       Ph: [phone]       Fax: [phone]   RxID:   [id number] ALDACTONE 25 MG TABS (SPIRONOLACTONE) 1 tab once daily  #30 x 9   Entered by:   Larita Fife Shemaiah Round LPN   Authorized by:   Kathlen Brunswick, MD, [facility]   Signed by:   Larita Fife Ingra Rother LPN on 03/06/2535   Method used:   Electronically to        [facility] (retail)       [address]       [place], [place]  [address]       Ph: [phone]       Fax: [phone]   RxID:   [id number]

## 2010-06-11 NOTE — Letter (Signed)
Summary: [place] [facility] Work Engineer, agricultural at [employer]  [address], [place] [address]   Phone: [phone]  Fax: [phone]     July 29, 2009 MRN: [id number]   Mario Cline [address] [place], [place]  [address]      YOUR LAB WORK IS DUE   ___________Tomorrow______________________________  Please go to [facility], located across the street from [facility] on the second floor.  Hours are Monday - Friday 7am until 7:30pm         Saturday 8am until 12noon    _x_  DO NOT EAT OR DRINK AFTER MIDNIGHT EVENING PRIOR TO LABWORK  __ YOUR LABWORK IS NOT FASTING --YOU MAY EAT PRIOR TO LABWORK

## 2010-06-11 NOTE — Miscellaneous (Signed)
Summary: Orders Update  Clinical Lists Changes  Orders: Added new Test order of T-Basic Metabolic Panel ([id number]) - Signed  

## 2010-06-11 NOTE — Miscellaneous (Signed)
Summary: Orders Update-PFT charges  Clinical Lists Changes  Orders: Added new Service order of Spirometry (Pre & Post) (94060) - Signed Added new Service order of Lung Volumes (94240) - Signed Added new Service order of Carbon Monoxide diffusing w/capacity (94720) - Signed 

## 2010-06-11 NOTE — Miscellaneous (Signed)
Summary: CBC,CMP,LIPIDS per Dr. Ouida Sills  Clinical Lists Changes  Observations: Added new observation of CALCIUM: 9.2 mg/dL (54/01/8118 14:78) Added new observation of ALBUMIN: 4.4 g/dL (29/56/2130 86:57) Added new observation of PROTEIN, TOT: 7.2 g/dL (84/69/6295 28:41) Added new observation of SGPT (ALT): 13 units/L (09/17/2009 12:15) Added new observation of SGOT (AST): 20 units/L (09/17/2009 12:15) Added new observation of ALK PHOS: 71 units/L (09/17/2009 12:15) Added new observation of BILI DIRECT: total bili   0.6 mg/dL (32/44/0102 72:53) Added new observation of CREATININE: 1.19 mg/dL (66/44/0347 42:59) Added new observation of BUN: 26 mg/dL (56/38/7564 33:29) Added new observation of BG RANDOM: 105 mg/dL (51/88/4166 06:30) Added new observation of CO2 PLSM/SER: 27 meq/L (09/17/2009 12:15) Added new observation of CL SERUM: 108 meq/L (09/17/2009 12:15) Added new observation of K SERUM: 5.0 meq/L (09/17/2009 12:15) Added new observation of NA: 144 meq/L (09/17/2009 12:15) Added new observation of LDL: 101 mg/dL (16/05/930 35:57) Added new observation of HDL: 34 mg/dL (32/20/2542 70:62) Added new observation of TRIGLYC TOT: 120 mg/dL (37/62/8315 17:61) Added new observation of CHOLESTEROL: 159 mg/dL (60/73/7106 26:94) Added new observation of PLATELETK/UL: 214 K/uL (09/17/2009 12:15) Added new observation of MCV: 104.1 fL (09/17/2009 12:15) Added new observation of HCT: 40.8 % (09/17/2009 12:15) Added new observation of HGB: 12.6 g/dL (85/46/2703 50:09) Added new observation of WBC COUNT: 6.3 10*3/microliter (09/17/2009 12:15)

## 2010-06-11 NOTE — Progress Notes (Signed)
Summary: Medication    Phone Note Call from Patient   Caller: Spouse Reason for Call: Talk to Nurse Summary of Call: pt's wife stated that she was to call Tammy regarding medication per Dr.Taylor/tg Initial call taken by: Raechel Ache [facility],  January 23, 2010 1:52 PM  Follow-up for Phone Call        Dr. Ladona Ridgel, pt perfers to wait and not take pradaxa , he will  continue his asa.   Follow-up by: Teressa Lower RN,  January 23, 2010 2:01 PM

## 2010-06-11 NOTE — Cardiovascular Report (Signed)
Summary: Office Visit Remote   Office Visit Remote   Imported By: Roderic Ovens 05/21/2009 14:46:02  _____________________________________________________________________  External Attachment:    Type:   Image     Comment:   External Document

## 2010-06-11 NOTE — Miscellaneous (Signed)
Summary: bmp per Dr. Ouida Sills  Clinical Lists Changes  Observations: Added new observation of CALCIUM: 9.3 mg/dL (16/02/9603 54:09) Added new observation of CREATININE: 1.67 mg/dL (81/19/1478 29:56) Added new observation of BUN: 42 mg/dL (21/30/8657 84:69) Added new observation of BG RANDOM: 85 mg/dL (62/95/2841 32:44) Added new observation of CO2 PLSM/SER: 27 meq/L (12/24/2009 11:50) Added new observation of CL SERUM: 107 meq/L (12/24/2009 11:50) Added new observation of K SERUM: 4.7 meq/L (12/24/2009 11:50) Added new observation of NA: 144 meq/L (12/24/2009 11:50)

## 2010-06-11 NOTE — Miscellaneous (Signed)
Summary: labs cbcd,cmp,lipids,08/01/2009  Clinical Lists Changes  Observations: Added new observation of CALCIUM: 9.5 mg/dL (40/02/2724 3:66) Added new observation of ALBUMIN: 4.5 g/dL (44/07/4740 5:95) Added new observation of PROTEIN, TOT: 7.2 g/dL (63/87/5643 3:29) Added new observation of SGPT (ALT): 14 units/L (08/01/2009 8:42) Added new observation of SGOT (AST): 21 units/L (08/01/2009 8:42) Added new observation of ALK PHOS: 66 units/L (08/01/2009 8:42) Added new observation of CREATININE: 1.32 mg/dL (51/88/4166 0:63) Added new observation of BUN: 40 mg/dL (01/60/1093 2:35) Added new observation of BG RANDOM: 102 mg/dL (57/32/2025 4:27) Added new observation of CO2 PLSM/SER: 26 meq/L (08/01/2009 8:42) Added new observation of CL SERUM: 106 meq/L (08/01/2009 8:42) Added new observation of K SERUM: 5.8 meq/L (08/01/2009 8:42) Added new observation of NA: 141 meq/L (08/01/2009 8:42) Added new observation of LDL: 97 mg/dL (10/31/7626 3:15) Added new observation of HDL: 27 mg/dL (17/61/6073 7:10) Added new observation of TRIGLYC TOT: 109 mg/dL (62/69/4854 6:27) Added new observation of CHOLESTEROL: 146 mg/dL (03/50/0938 1:82) Added new observation of ABSOLUTE BAS: 0.0 K/uL (08/01/2009 8:42) Added new observation of BASOPHIL %: 0 % (08/01/2009 8:42) Added new observation of EOS ABSLT: 0.2 K/uL (08/01/2009 8:42) Added new observation of % EOS AUTO: 2 % (08/01/2009 8:42) Added new observation of ABSOLUTE MON: 0.7 K/uL (08/01/2009 8:42) Added new observation of MONOCYTE %: 10 % (08/01/2009 8:42) Added new observation of ABS LYMPHOCY: 2.5 K/uL (08/01/2009 8:42) Added new observation of LYMPHS %: 35 % (08/01/2009 8:42) Added new observation of PLATELETK/UL: 218 K/uL (08/01/2009 8:42) Added new observation of RDW: 13.1 % (08/01/2009 8:42) Added new observation of MCHC RBC: 32.2 g/dL (99/37/1696 7:89) Added new observation of MCV: 104.6 fL (08/01/2009 8:42) Added new observation of HCT:  40.7 % (08/01/2009 8:42) Added new observation of HGB: 13.1 g/dL (38/02/1750 0:25) Added new observation of RBC M/UL: 3.89 M/uL (08/01/2009 8:42) Added new observation of WBC COUNT: 7.1 10*3/microliter (08/01/2009 8:42)

## 2010-06-11 NOTE — Letter (Signed)
Summary: [place] [facility] Work Engineer, agricultural at [employer]  [address] [place] [address]   Phone: [phone]  Fax: [phone]     August 11, 2009 MRN: [id number]   Mario Cline [address] [place], [place]  [address]      YOUR LAB WORK IS DUE  September 03, 2009 _________________________________________  Please go to [facility], located across the street from [facility] on the second floor.  Hours are Monday - Friday 7am until 7:30pm         Saturday 8am until 12noon    __  DO NOT EAT OR DRINK AFTER MIDNIGHT EVENING PRIOR TO LABWORK  _X_ YOUR LABWORK IS NOT FASTING --YOU MAY EAT PRIOR TO LABWORK

## 2010-06-11 NOTE — Assessment & Plan Note (Signed)
Summary: 6 mth f/u per checkout on 01/31/09/tg  Medications Added CARVEDILOL 25 MG TABS (CARVEDILOL) take one half tablet two times a day LISINOPRIL 20 MG TABS (LISINOPRIL) take one half tablet daily DAILY MULTIPLE VITAMINS  TABS (MULTIPLE VITAMIN) prn      Allergies Added: NKDA  Primary Provider:  Dr. Carylon Perches   History of Present Illness: Return visit for this very pleasant gentleman with nonischemic cardiomyopathy.  Mario Cline has done well over recent months with no symptoms but a basically sedentary lifestyle.  He does develop weakness in his legs with ambulation, but no true claudication.  He has difficulty arising from a kneeling position.  He denies orthopnea, PND, pedal edema and chest discomfort.  He has had no lightheadedness, no falls and no loss of consciousness.  He had a prolonged illness characterized by severe cough and sputum for which he was seen in urgent care and by Dr. Rayburn Ma.  Apparently, this was an episode of bronchitis, from which he has completely recovered.  Current Medications (verified): 1)  Furosemide 40 Mg Tabs (Furosemide) .... Take One and One Half  Tablets By Mouth Daily. 2)  Aldactone 25 Mg Tabs (Spironolactone) .Marland Kitchen.. 1 Tab Once Daily 3)  Carvedilol 25 Mg Tabs (Carvedilol) .... Take One Half Tablet Two Times A Day 4)  Lisinopril 20 Mg Tabs (Lisinopril) .... Take One Half Tablet Daily 5)  Daily Multiple Vitamins  Tabs (Multiple Vitamin) .... Prn 6)  Aspir-Trin 325 Mg Tbec (Aspirin) .... Take 1 Tab Daily  Allergies (verified): No Known Drug Allergies  Past History:  PMH, FH, and Social History reviewed and updated.  Past Medical History: Nonischemic cardiomyopathy; EF-45% in '03, 25% in '08, 25-30% in '11; AICD/dual-chamber pacemaker  (St. Jude)- 2/09; CHF Atrial fibrillation-11 hour episode detected in 12/10 during AICD interrogation ASCVD, nonobstructive, in 1997 with a normal ejection fraction Hypertension Orthostatic hypotension CHF Sleep  apnea DJD  Review of Systems       See history of present illness.  Vital Signs:  Patient profile:   77 year old male Weight:      188 pounds Pulse rate:   59 / minute BP sitting:   95 / 62  (right arm)  Vitals Entered By: Dreama Saa, CNA (July 29, 2009 11:18 AM)  Physical Exam  General:    Overweight; well developed; no acute distress:   Neck-No JVD; no carotid bruits Lungs-No tachypnea, no rales; no rhonchi; no wheezes: Cardiovascular-normal PMI; normal S1 and S2: modest systolic murmur; no third heart sound. Abdomen-BS normal; soft and non-tender without masses or organomegaly:  Musculoskeletal-No deformities, no cyanosis or clubbing: Neurologic-Normal cranial nerves; symmetric strength and tone:  Skin-Warm, no significant lesions: Extremities-Nl distal pulses; trace edema; minimal stasis changes over the right ankle      ICD Specifications Following MD:  Mario Bunting, MD     ICD Vendor:  St Jude     ICD Model Number:  [phone]     ICD Serial Number:  [id number] ICD DOI:  07/07/2007      Lead 1:    Location: RA     DOI: 07/07/2007     Model #: [id number]     Serial #: [id number]     Status: active Lead 2:    Location: RV     DOI: 07/07/2007     Model #: [id number]     Serial #: [id number]     Status: active  Indications::  NICM  Explantation Comments: Merlin  ICD Follow  Up ICD Dependent:  No      Episodes Coumadin:  No  Brady Parameters Mode DDD     Lower Rate Limit:  60     Upper Rate Limit 110 PAV 250     Sensed AV Delay:  250  Tachy Zones VF:  240     VT:  200     VT1:  181     Impression & Recommendations:  Problem # 1:  AUTOMATIC IMPLANTABLE CARDIAC DEFIBRILLATOR SITU (ICD-V45.02) Device was checked in March of this year in office, and home transmissions occur every 3 months.  A single 11 hour episode of atrial fibrillation was detected in December.  This is of some concern, but but does not clearly indicate a favorable risk benefit ratio for lifelong  anticoagulation.  Problem # 2:  CHRONIC SYSTOLIC HEART FAILURE (ICD-428.22) Patient appears to be class II with some limitation of activity.  An increase in regular exercise closer to his limit of tolerance is advised.  He would also benefit from weight loss, at least 10 pounds to return him to his weight of 2 years ago.  Problem # 3:  ORTHOSTATIC HYPOTENSION (ICD-458.0) No symptoms at present.  No intervention required.  Return visit-8 months.  Other Orders: Future Orders: T-Comprehensive Metabolic Panel ([id number]) ... 07/30/2009 T-Lipid Profile [phone]) ... 07/30/2009 T-CBC w/Diff ([id number]) ... 07/30/2009  Patient Instructions: 1)  Your physician recommends that you schedule a follow-up appointment in: 8 months 2)  Your physician recommends that you return for lab work XL:KGMW week 3)  Your physician discussed the importance of regular exercise and recommended that you start or continue a regular exercise program for good health. 4)  Your physician encouraged you to lose weight for better health.

## 2010-06-11 NOTE — Letter (Signed)
Summary: Remote Device Check  [facility], Main Office  [address]   [place], [place] [address]   Phone: [phone]  Fax: [phone]     October 28, 2009 MRN: [id number]   JADIAN KARMAN [address] [place], [place]  [address]   Dear Mr. SPITTLER,   Your remote transmission was recieved and reviewed by your physician.  All diagnostics were within normal limits for you.    __X____Your next office visit is scheduled for:   SEPTEMBER 2011 W/DR Ladona Ridgel.  Please call our office to schedule an appointment.    Sincerely,  Vella Kohler

## 2010-06-11 NOTE — Assessment & Plan Note (Signed)
Summary: icd check.[email or web address].amber  Medications Added CARVEDILOL 25 MG TABS (CARVEDILOL) take 1 tab two times a day LISINOPRIL 20 MG TABS (LISINOPRIL) take 1 tab daily ASPIR-TRIN 325 MG TBEC (ASPIRIN) take 1 tab daily      Allergies Added: NKDA  Visit Type:  Follow-up Primary Provider:  Dr. Lilyan Punt  CC:  no complaints today.  History of Present Illness: Mr. Bevill returns today for followup.  He is a very pleasant 77 year old male with a history of nonischemic cardiomyopathy, class I-II congestive heart failure who returns today for followup.  He admits to be in fairly inactive lately, but otherwise, no symptoms of chest pain or peripheral edema.  He has had no intercurrent ICD shocks. He was noted to have atrial fibrillation on Merlin monitoring back in December but has had none since.    Current Medications (verified): 1)  Furosemide 40 Mg Tabs (Furosemide) .... Take One and One Half  Tablets By Mouth Daily. 2)  Aldactone 25 Mg Tabs (Spironolactone) .Marland Kitchen.. 1 Tab Once Daily 3)  Carvedilol 25 Mg Tabs (Carvedilol) .... Take 1 Tab Two Times A Day 4)  Lisinopril 20 Mg Tabs (Lisinopril) .... Take 1 Tab Daily 5)  Daily Multiple Vitamins  Tabs (Multiple Vitamin) .Marland Kitchen.. 1 Tab Once Daily 6)  Aspir-Trin 325 Mg Tbec (Aspirin) .... Take 1 Tab Daily  Allergies (verified): No Known Drug Allergies  Past History:  Past Medical History: Last updated: 12/11/2008 Hypertension CHF sleep apnea DJD Nonischemic cardiomyopathy; ejection fraction of 25% in 12/08; AICD/dual-chamber pacemaker placed in 2/09.  Past Surgical History: Last updated: 12/11/2008 Tonsillectomy Pacemaker implantation-06/2007 Lumbosacral discectomy and fusion- Dr. Hope Pigeon lower back 25 yrs ago.  Review of Systems  The patient denies chest pain, syncope, dyspnea on exertion, and peripheral edema.    Vital Signs:  Patient profile:   77 year old male Weight:      186 pounds Pulse rate:   58 / minute BP sitting:   101 /  64  (right arm)  Vitals Entered By: Dreama Saa, CNA (July 16, 2009 2:54 PM)  Physical Exam  General:   General-Well developed; no acute distress:   Neck-No JVD; no carotid bruits; visible right neck pulsation-appears arterial Lungs-No tachypnea, no rales; no rhonchi; no wheezes: Cardiovascular-normal PMI; normal S1 and S2: modest systolic murmur; no third heart sound. Abdomen-BS normal; soft and non-tender without masses or organomegaly:  Musculoskeletal-No deformities, no cyanosis or clubbing: Neurologic-Normal cranial nerves; symmetric strength and tone:  Skin-Warm, no significant lesions: Extremities-Nl distal pulses; trace edema:      ICD Specifications Following MD:  [name], MD     ICD Vendor:  St Jude     ICD Model Number:  [phone]     ICD Serial Number:  [id number] ICD DOI:  07/07/2007      Lead 1:    Location: RA     DOI: 07/07/2007     Model #: [id number]     Serial #: [id number]     Status: active Lead 2:    Location: RV     DOI: 07/07/2007     Model #: [id number]     Serial #: [id number]     Status: active  Indications::  NICM  Explantation Comments: Merlin  ICD Follow Up Remote Check?  No Battery Voltage:  3.16 V     Charge Time:  11.0 seconds     Battery Est. Longevity:  5.2 years Underlying rhythm:  SR ICD Dependent:  No  ICD Device Measurements Atrium:  Amplitude: 2.8 mV, Impedance: 310 ohms, Threshold: 0.75 V at 0.5 msec Right Ventricle:  Amplitude: 7.2 mV, Impedance: 390 ohms, Threshold: 0.5 V at 0.5 msec Shock Impedance: 46 ohms   Episodes MS Episodes:  1     Percent Mode Switch:  <1%     Coumadin:  No Shock:  0     ATP:  0     Nonsustained:  0     Atrial Pacing:  38%     Ventricular Pacing:  <1%  Brady Parameters Mode DDD     Lower Rate Limit:  60     Upper Rate Limit 110 PAV 250     Sensed AV Delay:  250  Tachy Zones VF:  240     VT:  200     VT1:  181     Next Remote Date:  10/08/2009     Next Cardiology Appt Due:  07/09/2010 Tech Comments:  No  parameter changes.  11 hours A-fib noted on Merlin in December.  The patient was started on ASA 325mg .  Merlin transmissions every 3 months.  ROV 1 year with Dr. Ladona Ridgel in [place]. Altha Harm, LPN  July 16, 452 3:36 PM  MD Comments:  Agree with above.  Impression & Recommendations:  Problem # 1:  ATRIAL FIBRILLATION (ICD-427.31) I have discussed the treatment options with the patient and his wife.  ASA vs Coumadin have been discussed and the risks/benefits or each are outlined.  My bias is to recommend coumadin over ASA but he is not interested in followup and not interested in Pradaxa.  He will continue ASA. His updated medication list for this problem includes:    Carvedilol 25 Mg Tabs (Carvedilol) .Marland Kitchen... Take 1 tab two times a day    Aspir-trin 325 Mg Tbec (Aspirin) .Marland Kitchen... Take 1 tab daily  Problem # 2:  CHRONIC SYSTOLIC HEART FAILURE (ICD-428.22) He remains class 1-2 with regard to his CHF symptoms.  A low sodium diet is recommended. His updated medication list for this problem includes:    Furosemide 40 Mg Tabs (Furosemide) .Marland Kitchen... Take one and one half  tablets by mouth daily.    Aldactone 25 Mg Tabs (Spironolactone) .Marland Kitchen... 1 tab once daily    Carvedilol 25 Mg Tabs (Carvedilol) .Marland Kitchen... Take 1 tab two times a day    Lisinopril 20 Mg Tabs (Lisinopril) .Marland Kitchen... Take 1 tab daily    Aspir-trin 325 Mg Tbec (Aspirin) .Marland Kitchen... Take 1 tab daily  Problem # 3:  AUTOMATIC IMPLANTABLE CARDIAC DEFIBRILLATOR SITU (ICD-V45.02) His device is working normally.  He will be rechecked in several months.

## 2010-06-11 NOTE — Assessment & Plan Note (Signed)
Summary: DYSPNEA/6 MIN WALK/RJC   Nurse Visit   Vital Signs:  Patient profile:   77 year old male Pulse rate:   65 / minute BP sitting:   118 / 74  Allergies: No Known Drug Allergies  Orders Added: 1)  Pulmonary Stress (6 min walk) [[address]]   Six Minute Walk Test Medications taken before test(dose and time): Furosemide 40mg , Aldactone 25mg , Carvedilol 25mg , Lisinopril 20mg . Supplemental oxygen during the test: No  Lap counter(place a tick mark inside a square for each lap completed) lap 1 complete  lap 2 complete   lap 3 complete   lap 4 complete  lap 5 complete  lap 6 complete   Baseline  BP sitting: 118/ 74 Heart rate: 65 Dyspnea ( Borg scale) 3 Fatigue (Borg scale) 1 SPO2 99  End Of Test  BP sitting: 122/ 82 Heart rate: 51 Dyspnea ( Borg scale) 5 Fatigue (Borg scale) 3 SPO2 100  2 Minutes post  BP sitting: 120/ 80 Heart rate: 53 SPO2 100  Stopped or paused before six minutes? No  Interpretation: Number of laps  6 X 48 meters =   288 meters+ final partial lap: 45 meters =    333 meters   Total distance walked in six minutes: 333 meters  Tech ID: Reynaldo Minium CMA (May 13, 2010 10:24 AM) Tech Comments Pt completed test without any complaints or breaks.Reynaldo Minium CMA  May 13, 2010 10:24 AM

## 2010-06-11 NOTE — Progress Notes (Signed)
Summary: phone call   Phone Note Call from Patient   Caller: Spouse Reason for Call: Talk to Nurse Summary of Call: pt's wife wants return phone call regarding conversation last week/tg Initial call taken by: Raechel Ache [facility],  August 12, 2009 8:34 AM  Additional Follow-up for Phone Call Additional follow up Details #1::        Mailed potassium diet and lab orders to pt. yesterday, pt's wife will call us back on Fri. if they have not received them. Additional Follow-up by: Larita Fife Via LPN,  August 12, 2009 4:31 PM

## 2010-06-11 NOTE — Letter (Signed)
Summary: Remote Device Check  [facility], Main Office  [address]   [place], [place] [address]   Phone: [phone]  Fax: [phone]     May 25, 2010 MRN: [id number]   Mario Cline [address] [place], [place]  [address]   Dear Mario Cline,   Your remote transmission was recieved and reviewed by your physician.  All diagnostics were within normal limits for you.  __X___Your next transmission is scheduled for:  07-23-2010.  Please transmit at any time this day.  If you have a wireless device your transmission will be sent automatically.    Sincerely,  Vella Kohler

## 2010-06-11 NOTE — Letter (Signed)
Summary: [place] Results Engineer, agricultural at [facility]  [address], [place] [address]   Phone: [phone]  Fax: [phone]      Sep 15, 2009 MRN: [id number]   Mario Cline [address] [place], [place]  [address]   Dear Mr. GRAM,  Your test ordered by Selena Batten has been reviewed by your physician (or physician assistant) and was found to be normal or stable. Your physician (or physician assistant) felt no changes were needed at this time.  ____ Echocardiogram  ____ Cardiac Stress Test  __x__ Lab Work  ____ Peripheral vascular study of arms, legs or neck  ____ CT scan or X-ray  ____ Lung or Breathing test  ____ Other:  No change in medical treatment at this time, per Dr. Dietrich Pates.  Enclosed is a copy of your labwork for your records.  Thank you, Patric Vanpelt Allyne Gee RN    Emerado Bing, MD, Lenise Arena.C.Gaylord Shih, MD, F.A.C.C [name], MD, F.A.C.C Nona Dell, MD, F.A.C.C Charlton Haws, MD, Lenise Arena.C.C

## 2010-06-11 NOTE — Assessment & Plan Note (Signed)
Summary: Mario Cline  Medications Added PRADAXA 150 MG CAPS (DABIGATRAN ETEXILATE MESYLATE) Take 1 tablet by mouth two times a day      Allergies Added: NKDA  Visit Type:  Follow-up Primary Mario Cline:  Dr. Carylon Perches  CC:  no cardiology complaints.  History of Present Illness: Return visit for this very pleasant gentleman with nonischemic cardiomyopathy s/p prophylactic ICD implant over 2 years ago.  He denies any intercurrent ICD therapies. Mr. Spilde has done well over recent months with no symptoms but a basically sedentary lifestyle.  He does develop weakness in his legs with ambulation, but no true claudication.  He denies orthopnea, PND, pedal edema and chest discomfort.  He has had no lightheadedness, no falls and no loss of consciousness.    Current Medications (verified): 1)  Furosemide 40 Mg Tabs (Furosemide) .... Take One and One Half  Tablets By Mouth Daily. 2)  Aldactone 25 Mg Tabs (Spironolactone) .Marland Kitchen.. 1 Tab Once Daily 3)  Carvedilol 25 Mg Tabs (Carvedilol) .... Take One Half Tablet Two Times A Day 4)  Lisinopril 20 Mg Tabs (Lisinopril) .... Take One Half Tablet Daily 5)  Daily Multiple Vitamins  Tabs (Multiple Vitamin) .... Prn 6)  Aspir-Trin 325 Mg Tbec (Aspirin) .... Take 1 Tab Daily  Allergies (verified): No Known Drug Allergies  Past History:  Past Medical History: Last updated: 07/29/2009 Nonischemic cardiomyopathy; EF-45% in '03, 25% in '08, 25-30% in '11; AICD/dual-chamber pacemaker  (St. Jude)- 2/09; CHF Atrial fibrillation-11 hour episode detected in 12/10 during AICD interrogation ASCVD, nonobstructive, in 1997 with a normal ejection fraction Hypertension Orthostatic hypotension CHF Sleep apnea DJD  Past Surgical History: Last updated: 07/28/2009 Tonsillectomy Pacemaker implantation-06/2007 Lumbosacral discectomy and fusion- Dr. Bebe Shaggy Cholecystectomy-2000 and  Review of Systems  The patient denies chest pain, syncope, dyspnea on exertion, and  peripheral edema.    Vital Signs:  Patient profile:   77 year old male Weight:      184 pounds BMI:     28.08 Pulse rate:   58 / minute BP sitting:   102 / 59  (right arm)  Vitals Entered By: Dreama Saa, CNA (January 21, 2010 4:23 PM)  Physical Exam  General:    Overweight; well developed; no acute distress:   Neck-No JVD; no carotid bruits Lungs-No tachypnea, no rales; no rhonchi; no wheezes: Well healed ICD incision. Cardiovascular-normal PMI; normal S1 and S2: modest systolic murmur; no third heart sound. Abdomen-BS normal; soft and non-tender without masses or organomegaly:  Musculoskeletal-No deformities, no cyanosis or clubbing: Neurologic-Normal cranial nerves; symmetric strength and tone:  Skin-Warm, no significant lesions: Extremities-Nl distal pulses; trace edema; minimal stasis changes over the right ankle      ICD Specifications Following MD:  Mario Bunting, MD     ICD Vendor:  St Jude     ICD Model Number:  [id number]     ICD Serial Number:  [id number] ICD DOI:  07/07/2007      Lead 1:    Location: RA     DOI: 07/07/2007     Model #: [id number]     Serial #: [id number]     Status: active Lead 2:    Location: RV     DOI: 07/07/2007     Model #: [id number]     Serial #: [id number]     Status: active  Indications::  NICM  Explantation Comments: Merlin  ICD Follow Up Remote Check?  No Battery Voltage:  3.07 V     Charge Time:  10.9 seconds     Battery Est. Longevity:  4.7 years Underlying rhythm:  SR ICD Dependent:  No       ICD Device Measurements Atrium:  Amplitude: 2.6 mV, Impedance: 300 ohms, Threshold: 0.75 V at 0.5 msec Right Ventricle:  Amplitude: 5.4 mV, Impedance: 360 ohms, Threshold: 0.75 V at 0.5 msec Shock Impedance: 40 ohms   Episodes MS Episodes:  2     Percent Mode Switch:  <1%     Coumadin:  No Shock:  0     ATP:  0     Nonsustained:  0     Atrial Pacing:  48%     Ventricular Pacing:  <1%  Brady Parameters Mode DDD     Lower Rate Limit:  60     Upper Rate  Limit 110 PAV 250     Sensed AV Delay:  250  Tachy Zones VF:  240     VT:  200     VT1:  181     Next Remote Date:  04/23/2010     Next Cardiology Appt Due:  01/09/2011 Tech Comments:  mode switch episode > 20hours, - coumadin.  Device function normal.  Merlin transmissions every 3 months.  ROV 1 year with Dr. Ladona Ridgel in [place]. Altha Harm, LPN  January 21, 2010 4:44 PM  MD Comments:  Agree with above.  Impression & Recommendations:  Problem # 1:  AUTOMATIC IMPLANTABLE CARDIAC DEFIBRILLATOR SITU (ICD-V45.02) His device is working normally.  Will recheck in several months.  Problem # 2:  CHRONIC SYSTOLIC HEART FAILURE (ICD-428.22) His symptoms remain class 2.  Will continue meds as below and maintain a low sodium diet. His updated medication list for this problem includes:    Furosemide 40 Mg Tabs (Furosemide) .Marland Kitchen... Take one and one half  tablets by mouth daily.    Aldactone 25 Mg Tabs (Spironolactone) .Marland Kitchen... 1 tab once daily    Carvedilol 25 Mg Tabs (Carvedilol) .Marland Kitchen... Take one half tablet two times a day    Lisinopril 20 Mg Tabs (Lisinopril) .Marland Kitchen... Take one half tablet daily    Aspir-trin 325 Mg Tbec (Aspirin) .Marland Kitchen... Take 1 tab daily  Problem # 3:  ATRIAL FIBRILLATION (ICD-427.31) Since I last saw him he has had one episode of atrial fib.  I have recommended he start pradaxa or coumadin and the risks/benefits/goals of therapy have been discussed and he would like to think about starting.  I have given him a prescription for pradaxa and he is considering his options. His updated medication list for this problem includes:    Carvedilol 25 Mg Tabs (Carvedilol) .Marland Kitchen... Take one half tablet two times a day    Aspir-trin 325 Mg Tbec (Aspirin) .Marland Kitchen... Take 1 tab daily  Patient Instructions: 1)  Your physician recommends that you schedule a follow-up appointment in: 1 year 2)  Your physician has recommended you make the following change in your medication:  if you start pradaxa 150mg  two times a  day 3)  then stop aspirin Prescriptions: PRADAXA 150 MG CAPS (DABIGATRAN ETEXILATE MESYLATE) Take 1 tablet by mouth two times a day  #60 x 3   Entered by:   Teressa Lower RN   Authorized by:   Laren Boom, MD, [facility]   Signed by:   Teressa Lower RN on 01/21/2010   Method used:   Print then Give to Patient   RxID:   [id number]

## 2010-07-23 ENCOUNTER — Encounter: Payer: Self-pay | Admitting: Internal Medicine

## 2010-07-23 ENCOUNTER — Encounter (INDEPENDENT_AMBULATORY_CARE_PROVIDER_SITE_OTHER): Payer: MEDICARE

## 2010-07-23 DIAGNOSIS — I428 Other cardiomyopathies: Secondary | ICD-10-CM

## 2010-08-09 ENCOUNTER — Encounter: Payer: Self-pay | Admitting: *Deleted

## 2010-08-11 ENCOUNTER — Telehealth: Payer: Self-pay

## 2010-08-11 ENCOUNTER — Telehealth: Payer: Self-pay | Admitting: Internal Medicine

## 2010-08-11 NOTE — Telephone Encounter (Signed)
Transmission received, patient aware. 

## 2010-08-11 NOTE — Telephone Encounter (Signed)
CALLING FOR REMOTE CHECK RESULTS

## 2010-09-22 NOTE — Discharge Summary (Signed)
NAME:  Mario Cline, Mario Cline NO.:  [id number]   MEDICAL RECORD NO.:  [id number]          PATIENT TYPE:  INP   LOCATION:  6523                         FACILITY:  [facility]   PHYSICIAN:  Bevelyn Buckles. Bensimhon, MDDATE OF BIRTH:  Feb 13, 1929   DATE OF ADMISSION:  07/07/2007  DATE OF DISCHARGE:  07/08/2007                               DISCHARGE SUMMARY   PRIMARY CARDIOLOGIST:  Dr. Donnamarie Rossetti.   ELECTROPHYSIOLOGIST:  Dr. [name]   PRIMARY CARE PHYSICIAN:  [name], MD.   DISCHARGE SUMMARY:  Status post procedure with placement of a dual-  chamber defibrillator with a St. Jude device model Current DR serial  number [id number] implanted on July 07, 2007.   PAST MEDICAL HISTORY:  1. Hypertension due.  2. Nonischemic cardiomyopathy with an EF 25%.  3. Frequent ventricular ectopy.   HISTORY OF PRESENT ILLNESS:  Mario Cline is a 77 year old Caucasian  gentleman with past medical history as stated above.  History of  relative bradycardia with pulses in the 30s, frequent ventricular  ectopy.  He feels severely fatigued.  The patient was evaluated by Dr.  [name] for further consideration of pacemaker ICD, seen in  consultation on June 20, 2007.  The patient admitted for procedure  as stated above, tolerated procedure without complications.  Dr. Nicholes Mango in to examine and assess the patient on day of discharge.  The  patient afebrile, blood pressure 127/81, sat 94% on room air.  Chest x-  ray shows leads in RAA and RV, CM without heart failure, COPD, emphysema  noted.  No complications after pacer placement.  Patient being  discharged home to follow up with a wound check and then pacer clinic  office will arrange for a follow-up appointment and contact the patient  at home with dates and times.   DISCHARGE MEDICATIONS:  1. Carvedilol 3.125 mg b.i.d.  2. Furosemide 80 mg in a.m.  3. Lisinopril 10 mg b.i.d.  4. Aldactone 12.5 mg b.i.d.  5. For pain, the  patient has a prescription for Tylenol No. 3, 10      tablets without any refills.   He has been given the post pacemaker instructions along with the  discharge instructions for congestive heart failure.   DURATION OF DISCHARGE ENCOUNTER:  Less than 30 minutes.      Dorian Pod, ACNP      Bevelyn Buckles. Bensimhon, MD  Electronically Signed    MB/MEDQ  D:  07/08/2007  T:  07/09/2007  Job:  [id number]   cc:   Lorin Picket A. Gerda Diss, MD

## 2010-09-22 NOTE — Assessment & Plan Note (Signed)
[facility]                         ELECTROPHYSIOLOGY OFFICE NOTE   NAME:Mario Cline, Mario Cline                        MRN:          [id number]  DATE:06/20/2007                            DOB:          10-16-1928    Mr. Litke is referred today by Dr. Donnamarie Rossetti for consideration for  pacemaker/defibrillator.   CHIEF COMPLAINT:  Shortness of breath and fatigue.   HISTORY OF PRESENT ILLNESS:  He is a very pleasant 77 year old man who  has a history of nonischemic cardiomyopathy dating back to the 1990s.  IN past times, his LV function has changed between 20 and 45%.  Most  recent echo done back in December demonstrated an EF of 25%.  The  patient was has relative bradycardia and has been found to have pulse  rates in the 30 range.  He has frequent ventricular ectopy and often is  in sinus rhythm with frequent PVCs.  With this, he feels severely  fatigued.  He notes that when he is at rest, he feels okay but when he  gets up and tries to much of anything, he gets short of breath and  tired.  He has had no frank syncope.   ADDITIONAL PAST MEDICAL HISTORY:  Notable for  1. Hypertension.  2. He does not have coronary disease to speak of.  3. He has history of chest pain in the past.   FAMILY HISTORY:  Noncontributory.   SOCIAL HISTORY:  The patient is married.  He denies tobacco or ethanol  abuse.   REVIEW OF SYSTEMS:  Notable for exertional dyspnea which is class II to  III.  He has orthopnea and PND.  He has mild peripheral edema.  He has a  history of chronic cough.  The rest of his review of systems was  reviewed and negative except as noted above.   PHYSICAL EXAMINATION:  GENERAL APPEARANCE:  He is a pleasant, well-  appearing, elderly man in no distress.  VITAL SIGNS:  Blood pressure was 108/54, pulse was 40 and irregular, the  respirations were 18.  Weight was 180 pounds.  HEENT:  Normocephalic and atraumatic.  Pupils equal and round.  Oropharynx was moist.  Sclerae were anicteric.  NECK:  No jugular distention.  No thyromegaly.  Trachea was midline.  Carotid 2+ and symmetric.  LUNGS:  Clear bilaterally to auscultation.  No wheezes, rales or rhonchi  were present.  No increased work of breathing.  CARDIAC:  Exam revealed an irregular rate and rhythm with normal S1 and  S2.  There was a soft systolic murmur at the left lower sternal border.  The PMI was enlarged and laterally displaced.  ABDOMEN:  Soft, nontender, nondistended, there was no organomegaly.  Bowel sounds were present.  There was no rebound or guarding.  EXTREMITIES:  Trace peripheral edema.  There was no cyanosis or  clubbing.  NEUROLOGIC:  Alert and oriented x3.  Cranial nerves intact.  Strength  was 5/5 and symmetric.  SKIN:  Exam was normal.   EKG demonstrates sinus rhythm with PVCs in a bigeminal fashion.   IMPRESSION:  1. Nonischemic cardiomyopathy with severe left ventricular      dysfunction.  2. Congestive heart failure class II to III.  3. Dense ventricular ectopy with frequent PVCs.   DISCUSSION:  I have discussed a few times with the patient.  Dual  chamber ICD implantation would be strongly consider at this point.  Hopefully with this, we can reinitiate his beta blockers as he has been  intolerant of these in the last several months secondary to worsening  chronotropic incompetence.  Of note, on beta blockers the patient had  junctional rhythm and no sinus beats.  Will schedule ICD implantation at  the earliest possible convenient time.     Doylene Canning. Ladona Ridgel, MD  Electronically Signed    GWT/MedQ  DD: 06/20/2007  DT: 06/22/2007  Job #: [id number]   cc:   Lorin Picket A. Gerda Diss, MD

## 2010-09-22 NOTE — Letter (Signed)
Sep 13, 2007    Mario A. Gerda Diss, MD  [address], [address]  [place], [place] [address]   RE:  Mario Cline, Mario Cline  MRN:  [id number]  /  DOB:  1928/11/17   Dear Mario Cline:   Mario Cline returns to the office for continuous assessment and treatment  of a nonischemic cardiomyopathy.  Since his last visit, he has done  generally well.  He does light yard work, typically without symptoms  other than easy fatigability.  He sometimes notes dyspnea on exertion  that resolves quickly with rest.  He has been following a low salt and  low potassium diet.  Weight has been maintained.  He notes no pedal  edema.  He has no orthopnea or PND.   MEDICATIONS:  Unchanged from his last visit except for an increase in  his dose of carvedilol to 12.5 mg b.i.d.   His defibrillator has been checked and is functioning normally.  It has  not discharged.   PHYSICAL EXAMINATION:  GENERAL:  A pleasant gentleman in no acute  distress.  VITAL SIGNS:  The weight is 183, unchanged.  Blood pressure 115/70,  heart rate 74 with multiple prematures, respirations 16.  NECK:  No jugular venous distention; no carotid bruits.  LUNGS:  Clear.  CARDIAC:  Normal first heart sounds; increased pulmonic component of the  second heart sound.  ABDOMEN:  Soft and nontender; no organomegaly.  EXTREMITIES:  No edema.   Chemistry profile from 3 weeks ago was essentially normal.   IMPRESSION:  Mario Cline is doing quite well with medical therapy.  We  will further increase carvedilol to 25 mg b.i.d.  Spironolactone and  lisinopril doses will be consolidated into a single daily dose.  I will  reassess this nice gentleman in 4 months with a repeat chemistry profile  in 2 months.    Sincerely,      [name], MD, [facility]  Electronically Signed    RMR/MedQ  DD: 09/13/2007  DT: 09/13/2007  Job #: [id number]

## 2010-09-22 NOTE — Letter (Signed)
May 15, 2007    [name], MD  [address]  [place], [place] [address]   RE:  Mario Cline, Mario Cline  MRN:  [id number]  /  DOB:  1928-10-24   Dear Lorin Picket:   Mr. Waln returns the office for continued assessment and treatment of  nonischemic cardiomyopathy.  Since the last visit that, he was initially  improved, but subsequently developed a recurrent cough that is modestly  productive of yellow sputum.  He has had no fever nor rigors.  He has  had no orthopnea or PND.  Overall, his dyspnea appears improved although  he has lost no weight.  He brings in the chart that shows very stable  values at home.   CURRENT MEDICATIONS:  1. Lisinopril 20 mg daily.  2. Amlodipine 2.5 mg daily.  3. Furosemide 80 mg daily.  4. Toprol 25 mg daily.   EXAM:  Pleasant gentleman in no acute distress.  Frequent nonproductive  cough noted.  The weight is 189, 2 pounds more than in mid December.  Blood pressure  125/70, heart rate 64 with multiple prematures.  NECK:  Prominent pulsations that appear to be arterial rather than  venous; no carotid bruits.  LUNGS:  Clear.  CARDIAC:  Normal first and second heart sounds; third heart sound  present.  EXTREMITIES:  Trace edema on the right; 1/2+ edema on the left.   RHYTHM STRIP:  Sinus rhythm with frequent PVCs.   Echocardiogram shows mild left ventricular dilatation with severely  impaired systolic function.  Estimated ejection fraction was 0.45.  There was also some degree of right ventricular dilatation and  dysfunction.  Moderate mitral insufficiency was present without  structural valvular disease.  RV systolic pressures were not estimated.   Repeat chemistry profile is normal.  Repeat chest x-ray shows resolution  of previously noted pulmonary edema.   IMPRESSION:  Mr. Mancha current situation is difficult to explain.  He  has redeveloped left ventricular systolic dysfunction, which was  originally noted in 1997, but absent in 2006.   He is improved clinically  with respect to congestive heart failure although he has actually gained  weight.  In the setting of improvement in dyspnea, he has redeveloped a  significant cough.  For now, I do not believe anything terribly  worrisome is going on other than recurrence of his cardiomyopathy.  We  will substitute carvedilol 6.25 mg for Toprol.  Spironolactone 25 mg  will be added to his medical regime, which should help in terms of  cardiomyopathy and perhaps provide additional diuresis.  Amlodipine can  be discontinued at some point if necessary, but will be continued for  now.  I doubt that his cough is related to his ACE inhibitor, which he  has tolerated for many years.  I will continue to follow this gentleman  closely, obtaining chemistry profiles and 1 and 3 weeks and return  office visit thereafter.    Sincerely,      [name], MD, [facility]  Electronically Signed    RMR/MedQ  DD: 05/15/2007  DT: 05/15/2007  Job #: [id number]

## 2010-09-22 NOTE — Assessment & Plan Note (Signed)
[facility]                         ELECTROPHYSIOLOGY OFFICE NOTE   NAME:JONESAndrez, Cline                        MRN:          [id number]  DATE:07/24/2007                            DOB:          February 28, 1929    [name] returns today for followup.  He is a very pleasant male with  history of nonischemic cardiomyopathy and hypertension and very frequent  and dense ventricular ectopy who was hospitalized last month and  underwent implantation of a dual-chamber defibrillator.  This was not a  Bi-V device.  The patient has a narrow QRS baseline.  He returns today  for followup and feels otherwise well.  The patient does note that when  he takes his blood pressure his heart rate still varies between the high  30s at times and the low 60s at times.  He has had no significant  palpitations and he denies chest pain or shortness of breath at present.   MEDICATIONS:  1. Aldactone 12.5 twice a day.  2. Lisinopril 10 twice a day.  3. Lasix 80 a day.  4. Coreg 6.25 twice daily with plans to up titrated up to 12 twice      daily.   PHYSICAL EXAMINATION:  GENERAL:  A pleasant, elderly-appearing man in no  acute distress.  VITAL SIGNS:  Blood pressure 118/78, pulse 60 and regular, respirations  were 18.  Weight 183 pounds.  NECK:  No jugular venous distention.  LUNGS:  Clear bilaterally to auscultation.  No wheezes, rales or rhonchi  are present.  No increased work of breathing.  CARDIAC:  Regular rate and rhythm.  Normal S1, S2.  EXTREMITIES:  Demonstrated no edema.   Interrogation of the defibrillator demonstrates a St. Jude current dual-  chamber ICD with P waves and R waves 2 and 11.  Impedance is at 410 at  409 in the A and the V.  Threshold is 0.5 at 0.5 in the A and the RV.  Battery voltage was greater than 3.2 V.  There are no intercurrent IC  therapies.  He was 18% A-paced and 21% of the time he had ventricular  bigeminy.   IMPRESSION:  1.  Nonischemic cardiomyopathy.  2. Congestive heart failure, now Class II.  3. Very dense ventricular ectopy with 20% of the beats being premature      ventricular contractions.   DISCUSSION:  Today, I have asked Mario Cline to continue up-titration of  his carvedilol going up to 12.5 mg twice daily and hopefully, we can  advance this up to 25 mg twice a day.  I am also in  hopes that his ventricular ectopy will improve as do his symptoms of  congestive heart failure.  I will plan to see the patient back in  several months.     Mario Cline. Mario Ridgel, MD  Electronically Signed    GWT/MedQ  DD: 07/24/2007  DT: 07/25/2007  Job #: [id number]

## 2010-09-22 NOTE — Letter (Signed)
February 27, 2008    [name], MD  [address]  [place], [place] [address]   RE:  [name], Mario Cline  MRN:  [id number]  /  DOB:  [birth year]   Dear Lorin Picket:   Mario Cline was seen at his wife's request prior to planned  cholecystectomy.  As you know, he was seen at the hospital approximately  2 weeks ago for right upper quadrant pain and was found to have  nephrolithiasis.  The laparoscopic cholecystectomy was advised.  His  wife was concerned about the risk in light of his longstanding heart  disease and requested reevaluation.  I recently saw Mario Cline in  followup.  He has class II symptoms and has not had a significant  exacerbation of congestive heart failure for years.  He does have  severely impaired left ventricular systolic function, but good  performance status.  Cardiac catheterization in 1997 demonstrated no  coronary atherosclerosis.  His symptoms have not changed since his last  visit nor has his examination.   Mario Cline' situation was discussed at length with him and his wife.  I  believe that surgery is warranted and can be performed with acceptable  morbidity and a low risk of mortality, particularly if the method is  laparoscopic.  No special precautions are necessary other than the  continue his beta-blocker pre and postoperatively and that his device be  deactivated  intraoperatively if electrocautery is to be used.  These recommendations  were transmitted to Dr. Dwain Sarna via fax.  My partners in [facility] should be consulted if there are any concerns  perioperatively.    Sincerely,      [name], MD, [facility]  Electronically Signed    RMR/MedQ  DD: 02/27/2008  DT: 02/28/2008  Job #: [id number]   CC:    Juanetta Gosling, MD

## 2010-09-22 NOTE — Op Note (Signed)
NAME:  Mario Cline, Mario Cline NO.:  [id number]   MEDICAL RECORD NO.:  [id number]          PATIENT TYPE:  INP   LOCATION:  2550                         FACILITY:  [facility]   PHYSICIAN:  Mario Cline, MDDATE OF BIRTH:  1928/05/11   DATE OF PROCEDURE:  03/15/2008  DATE OF DISCHARGE:                               OPERATIVE REPORT   PREOPERATIVE DIAGNOSIS:  Symptomatic cholelithiasis.   POSTOPERATIVE DIAGNOSIS:  Chronic cholecystitis.   PROCEDURE:  Laparoscopic cholecystectomy.   SURGEON:  Mario Gosling, MD   ASSISTANT:  Louisa Second.   ANESTHESIA:  General.   FINDINGS:  Chronically inflamed gallbladder.   SPECIMEN:  Gallbladder and contents to Pathology.   ESTIMATED BLOOD LOSS:  Minimal.   DRAINS:  None.   COMPLICATIONS:  None.   DISPOSITION:  To PACU in stable condition.   HISTORY:  Mario Cline is a 77 year old male who since early October began  having right upper quadrant pain associated with some nausea and no  emesis.  He underwent a CT scan in [place] to find out gallstones  without evidence of acute cholecystitis.  His lab examination at that  time showed a white blood cell count of 10.6 with no left shift.  LFTs  were normal at that time.  He was referred for evaluation for a  cholecystectomy.   His past medical history includes hypertension, coronary artery disease,  obstructive sleep apnea, and congestive heart failure.   Past surgical history is for tonsillectomy, AICD, and back surgeries.   Medications include Lasix, Coreg, aldactone, and Zestril.   On his exam, he had some mild tenderness in his right upper quadrant.  I  counseled him for a laparoscopic possible open cholecystectomy.  He was  evaluated by his cardiologist, Dr. Dietrich Cline prior to the operation as  well.   PROCEDURE:  After informed consent was obtained, the patient was taken  to the operating room.  He was placed under general anesthesia without  complication.   His AICD had previously been turned off.  1 g of  cefoxitin was administered.  Sequential compression devices were placed  on his lower extremities.  His abdomen was then prepped and draped in  standard sterile surgical fashion, a surgical time-out was then  performed.   A 10-mm infraumbilical curvilinear incision was then made and dissection  carried out down to the level of this fascia, which was incised sharply.  A Vicryl purse-string suture was then placed around the fascia after his  abdomen was entered bluntly.  Hasson trocar was then inserted, and the  abdomen was then insufflated to 15 mmHg pressure without complication.  He tolerated this well.  After infiltration of 0.25% Marcaine under  direct vision, 3 further 5-mm ports were placed in the epigastrium and 2  in the right upper quadrant without complication.  Upon retracting his  gallbladder cephalad, his gallbladder was noted to be contracted, full  of stones with very thick wall.  This was retracted cephalad.  He had  some adhesions to the inferior portion of the gallbladder that were  taken down bluntly.  Then his triangle of Calot was dissected.  There  was a fair amount of scarring present indicative of his disease process.  The triangle of Calot was dissected.  The cystic artery and cystic duct  were clearly identified entering the gallbladder and the critical view  of safety was obtained.  Once this view was obtained, I then clipped the  duct 3 times and cut it leaving 2 clips in place.  I then proceeded to  do the same with the artery.  The gallbladder was then removed from the  liver bed with electrocautery fairly easily.  A 5 mm CAM was then placed  in the epigastrium.  The EndoCatch was placed in the umbilicus and the  gallbladder was then removed from the umbilicus as an umbilical incision  had to be enlarged couple of centimeters just to remove his gallbladder  due to the amount of stones and the thickness of his  gallbladder wall.  Following removal, the purse-string was also removed.  The Hasson trocar  was then reinserted.  The 10-mm camera was reinserted.  The gallbladder  bed was inspected.  Hemostasis was observed.  The clips were all in good  position.  Irrigation was performed.  This was clear.  Following this,  all of the trocars were then removed and the abdomen was desufflated.  A  0-Vicryl suture was then used to run the umbilical incision close  without complication.  4-0 Monocryls were used to close the skin in a  subcuticular fashion.  Dermabond were placed over the wounds.  He  tolerated this procedure well with no cardiac complications.  He was  extubated in the operating room and transferred to PACU in stable  condition where he will have his AICD turned back on.  He will remain  overnight given his comorbidities.      Mario Gosling, MD  Electronically Signed     MCW/MEDQ  D:  03/15/2008  T:  03/15/2008  Job:  [id number]   cc:   Mario Picket A. Gerda Diss, MD  Mario Friends. Mario Pates, MD, [facility]

## 2010-09-22 NOTE — Op Note (Signed)
NAME:  Mario Cline, ENGELBERT NO.:  [id number]   MEDICAL RECORD NO.:  [id number]          PATIENT TYPE:  INP   LOCATION:  6523                         FACILITY:  [facility]   PHYSICIAN:  Doylene Canning. Ladona Ridgel, MD    DATE OF BIRTH:  02-Jun-1928   DATE OF PROCEDURE:  07/07/2007  DATE OF DISCHARGE:                               OPERATIVE REPORT   PROCEDURE PERFORMED:  Implantation of a dual chamber ICD.   INDICATION:  Ischemic cardiomyopathy, congestive heart failure class II  to III with symptomatic bradycardia.  The patient is a 77 year old male  with a history of longstanding LV dysfunction with an EF of 20-25%,  sinus node dysfunction and class II to III congestive heart failure.  He  is now referred for ICD implantation.   PROCEDURE IN DETAIL:  After informed consent was obtained the patient  was taken to the diagnostic EP lab in a fasting state.  After the usual  preparation and draping intravenous fentanyl and midazolam was given for  sedation.  Thirty mL of lidocaine was infiltrated in the left  infraclavicular region.  A 7 cm incision was carried out over this  region.  Electrocautery was utilized to dissect down to the fascial  plane.  The left subclavian vein was then punctured x2 and the St. Jude  model 7121 65 cm active fixation defibrillation lead, serial number  [id number] was advanced into the right ventricle.  The St. Jude model 1699T  52 cm fixation pacing lead, serial number [id number] was advanced into the  right atrium.  Mapping was initially carried out in the right ventricle  with the final site on the RV septum.  The R-waves measured 13 mV and  the pacing impedance was 700 ohms, threshold 0.3 volts at 0.5  milliseconds.  Ten volt pacing did not stimulate the diaphragm.  With  the ventricular lead in satisfactory position attention was then turned  to placement of the atrial lead which was placed in the anterolateral  portion of the right atrium where P-waves were  2.2 mV and a pacing  impedance 573 ohms, threshold 0.7 volts at 0.5 milliseconds.  With both  the atrial and ventricular leads in satisfactory position it was secured  to the subpectoralis fascia with a figure-of-eight silk suture.  The  sewing sleeve was also secured with silk suture.  Electrocautery was  utilized to make a subcutaneous pocket and Kanamycin irrigation was  utilized to irrigate the pocket.  Electrocautery was utilized to assure  hemostasis.  The St. Jude current DRRF dual chamber ICD serial number  [id number] was connected to the atrial and defibrillation leads and placed  back in the subcutaneous pocket.  Generator was secured with silk  suture.  Additional kanamycin was utilized to irrigate the pocket.  The  incision was then closed with a layer of 2-0 Vicryl followed by 3-0  Vicryl.  Defibrillation threshold testing was carried out.  The patient  was deeply sedated with fentanyl and Versed, VF was induced with a T-  wave shock and a 15 joule shock was delivered which  terminated VF and  restored sinus rhythm.  At this point no additional defibrillation  threshold testing was carried out and the incision closed with a layer  of 2-0 Vicryl and the patient's skin was prepped with Betadine with  Benzoin and Steri-Strips and the patient was returned to his room in  satisfactory condition.   COMPLICATIONS:  There were no immediate procedure complications.  Results were successful implantation of a St. Jude dual chamber ICD in a  patient with ischemic cardiomyopathy, congestive heart failure class II  to III, EF of 25% and sinus bradycardia.      Doylene Canning. Ladona Ridgel, MD  Electronically Signed     GWT/MEDQ  D:  07/07/2007  T:  07/08/2007  Job:  [id number]   cc:   [name], MD, [facility]  [name], MD

## 2010-09-22 NOTE — Letter (Signed)
January 30, 2008    [name], MD  [address], [address]  [place], [place] [address]   RE:  Mario Cline, Mario Cline  MRN:  [id number]  /  DOB:  March 19, 1929   Dear Lorin Picket,   Mr. Lamping returns to the office for continued management of nonischemic  cardiomyopathy with a history of clinical congestive heart failure.  Since his last visit, he has done fairly well.  He continues to report  some malaise and episodes of dizziness lasting seconds to perhaps a  minute or so.  He has had no falls nor loss of consciousness.  His  weight has been stable.  He notes minimal ankle edema.  Overall, he  feels that he is doing well.  He has had some minor breast tenderness,  but does not feel that anything needs to be done about this.   Current medications include spironolactone 25 mg daily, furosemide 80 mg  daily, carvedilol 25 mg b.i.d., lisinopril 10 mg daily.   PHYSICAL EXAMINATION:  GENERAL:  On exam, pleasant, well-appearing  gentleman.  VITAL SIGNS:  The weight is 182, stable.  Blood pressure 95/70, heart  rate 88 with multiple prematures, respirations 16.  NECK:  No jugular venous distention; normal carotid upstrokes without  bruits.  LUNGS:  Clear.  CARDIAC:  Normal first and second heart sounds; minimal systolic murmur.  Abdomen:  Distended; soft and nontender; normal bowel sounds.  EXTREMITIES:  Trace edema; distal pulses intact.   EKG:  Normal sinus rhythm with frequent PVCs; delayed R-wave  progression; left axis; minor QT prolongation; ST-T-wave abnormality  consistent with LVH or lateral ischemia.   IMPRESSION:  Mr. Kozlov is doing well overall.  I do not think  spironolactone needs to be discontinued or substituted with eplerenone  based upon his minor breast discomfort.  We will decrease his dose of  carvedilol to 12.5 mg b.i.d. to determine if this gives him more energy  and less dizziness.  He wonders if he should participate in physical  fitness activities.  I suggested light  weights with multiple reps.  Laboratory obtained few weeks ago was excellent with a normal CBC and a  normal chemistry profile.  TSH was normal.  BNP level was 377.  I will  plan to see this nice gentleman again in 4 months.  We will arrange  continuing management of his automatic implantable cardioverter-  defibrillator/pacemaker with our Electrophysiology Service.    Sincerely,      [name], MD, [facility]  Electronically Signed    RMR/MedQ  DD: 01/30/2008  DT: 01/31/2008  Job #: [id number]

## 2010-09-22 NOTE — Letter (Signed)
July 18, 2007    [name], MD  [address]  [place],  [place] [address]   RE:  Mario Cline, Mario Cline  MRN:  [id number]  /  DOB:  Dec 23, 1928   Dear Mario Cline:   Mario Cline returns to the office for continued assessment and treatment  of nonischemic cardiomyopathy and conduction system disease.  Since his  last visit, he underwent implantation of a biventricular pacemaker/AICD.  This procedure was uncomplicated.  He notes decreased dyspnea and  decreased cough since the device was placed.   Current medications include spironolactone 12.5 mg b.i.d., lisinopril 10  mg b.i.d., furosemide 80 mg daily, carvedilol 3.125 mg b.i.d.   On exam, pleasant gentleman in no acute distress.  The weight is 181, 1  pound more than in February.  Blood pressure 100/70, heart rate 60 and  regularly irregular, respirations 14.  NECK:  No jugular venous distention; no carotid bruits.  LUNGS:  Clear.  CARDIAC:  Distant first and second heart sounds.  THORAX:  Well-healing pacemaker insertion site in the left  infraclavicular region.  ABDOMEN:  Soft and nontender; no organomegaly.  EXTREMITIES:  No edema.   EKG:  Probable atrial pacing with ventricular bigeminy; left anterior  fascicular block; delayed R-wave progression; possible LVH with  repolarization abnormality; mildly prolonged QT.   IMPRESSION:  Mario Cline is symptomatically improved, but ventricular  bigeminy persists.  He is scheduled to see Dr. Ladona Ridgel in the near future  at which time his pacemaker can be adjusted in  attempt to suppress his arrhythmia.  We will also increase his dose of  beta blocker, hopefully to achieve a similar goal.  He will be seen  again by the cardiology nurse in 4 weeks to facilitate dose escalation  of carvedilol.  I will see him again in 2 months.  A chemistry profile  will be repeated in 6 weeks.    Sincerely,      [name], MD, [facility]  Electronically Signed    RMR/MedQ  DD: 07/18/2007  DT:  07/19/2007  Job #: [id number]

## 2010-09-22 NOTE — Assessment & Plan Note (Signed)
[facility]                         ELECTROPHYSIOLOGY OFFICE NOTE   NAME:JONESAtzel, Mccambridge                        MRN:          [id number]  DATE:07/10/2008                            DOB:          December 25, 1928    Mr. Bedoy returns today for followup.  He is a very pleasant 77 year old  male with a history of nonischemic cardiomyopathy, class I-II congestive  heart failure who returns today for followup.  He admits to be in fairly  inactive lately, but otherwise, no symptoms of chest pain or peripheral  edema.  He has had no intercurrent ICD shocks.   CURRENT MEDICATIONS:  1. Aldactone 25 a day.  2. Lasix 80 a day.  3. Coreg 12.5 twice daily.  4. Lisinopril 10 a day.  5. Multivitamin.   PHYSICAL EXAMINATION:  GENERAL:  He is a pleasant, well-appearing  elderly man in no distress.  VITAL SIGNS:  Blood pressure 112/66, the pulse 64 and regular,  respirations were 18, and the weight was 183 pounds.  NECK:  No jugular venous distention.  LUNGS:  Clear to bilateral auscultation.  No wheezes, rales, or rhonchi  present.  There is no increased work of breathing.  CARDIOVASCULAR:  Regular rate and rhythm.  Normal S1 and S2.  ABDOMEN:  Soft and nontender.  It should note that the PMI was enlarged  and laterally displaced.  EXTREMITIES:  No edema.   Interrogation of his defibrillator was carried out today.  It  demonstrates a St. Jude current dual chamber device.  The P waves were  3, the R waves were 9, the impendence 330, and AF 14 in the RV.  The  threshold 0.5 at 0.5 in the AF and 0.75 at 0.5 in the RV.  Battery  voltage was greater than 3.2 volts.  There was no intercurrent ICD  therapies and no arrhythmia is noted.   IMPRESSION:  1. Nonischemic cardiomyopathy.  2. Congestive heart failure.  3. Status post dual-chamber implantable cardioverter-defibrillator      insertion.   I discussed in overall Mr. Melman is stable and his defibrillator is  working normally.  His heart rate is well controlled.  I have encouraged  him to continue to remain as active as possible, walking on a regular  basis.  I will see him back for ICD followup in 1 year.     Doylene Canning. Ladona Ridgel, MD  Electronically Signed    GWT/MedQ  DD: 07/10/2008  DT: 07/10/2008  Job #: [id number]

## 2010-09-22 NOTE — Discharge Summary (Signed)
NAME:  Cline, Mario NO.:  [id number]   MEDICAL RECORD NO.:  [id number]          PATIENT TYPE:  INP   LOCATION:  3731                         FACILITY:  [facility]   PHYSICIAN:  Juanetta Gosling, MDDATE OF BIRTH:  [birth year]   DATE OF ADMISSION:  03/15/2008  DATE OF DISCHARGE:  03/16/2008                               DISCHARGE SUMMARY   ADMITTING DIAGNOSES:  1. Chronic cholecystitis.  2. Hypertension.  3. Coronary artery disease.  4. Obstructive sleep apnea.  5. Congestive heart failure.   DISCHARGE DIAGNOSES:  1. Chronic cholecystitis.  2. Hypertension.  3. Coronary artery disease.  4. Obstructive sleep apnea.  5. Congestive heart failure.   PROCEDURES PERFORMED:  On March 15, 2008, laparoscopic  cholecystectomy.   DISCHARGE MEDICATIONS:  1. Lasix 80 mg daily.  2. Coreg 12.5 mg b.i.d.  3. Aldactone 25 mg daily.  4. Zestril 10 mg daily.  5. Percocet p.r.n.   FOLLOWUP:  In two weeks at [facility].   HISTORY:  [name] is a 77 year old male who had a history of right  upper quadrant pain, associated with some nausea.  He underwent a CT  scan at [facility] with the finding of gallstones without any evidence  of acute cholecystitis.  I evaluated him for a laparoscopic possible  open cholecystectomy.  He underwent cardiac evaluation prior to this  with plans to turn off his AICD during his surgery.  He underwent a  laparoscopic cholecystectomy for chronic cholecystitis on March 15, 2008, from which he did well.  He had no cardiac issues postoperatively.  He was tolerating a diet, was ready for discharge home the following day  with followup with me in one week.      Juanetta Gosling, MD  Electronically Signed    MCW/MEDQ  D:  06/26/2008  T:  06/26/2008  Job:  [id number]

## 2010-09-22 NOTE — Assessment & Plan Note (Signed)
[facility]                         ELECTROPHYSIOLOGY OFFICE NOTE   NAME:JONESGurshaan, Matsuoka                        MRN:          [id number]  DATE:02/07/2008                            DOB:          09-03-1928    Mr. Cavallero returns today for followup.  This is a very pleasant male with  a nonischemic cardiomyopathy and congestive heart failure, status post  ICD insertion who returns today for followup.  The patient's EF has been  approximately 25% in the past.  He also notes hypertension.  He is able  to walk, but his heart failure symptom has otherwise been class II.  He  returns today for followup.  He has had no intercurrent IC therapies.  He denies chest pain.  He is able to walk on a regular basis and today,  he walked a mile and a half without particular difficulty.   MEDICATIONS:  1. Aldactone 25 a day.  2. Lasix 80 a day.  3. Coreg 12.5 mg twice daily.  4. Lisinopril 10 mg daily.   PHYSICAL EXAMINATION:  GENERAL:  He is a pleasant middle-aged man, in no  distress.  VITAL SIGNS:  The blood pressure was 120/70, the pulse 64 and irregular,  and respirations were 18.  The weight was 186 pounds.  NECK:  Revealed no jugular venous distention.  LUNGS:  Clear bilaterally to auscultation.  No wheezes, rales, or  rhonchi are present.  CARDIOVASCULAR:  Revealed an irregular rhythm with normal S1 and S2.  EXTREMITIES:  Demonstrated no edema.   EKG demonstrates sinus rhythm with very frequent and consecutive PVCs in  couplets.  The morphology of his PVCs is a right bundle-branch block  pattern with positive concordance in the precordial leads.  Also, the  inferior leads are markedly positive, suggesting that his AVR and AVL  are both quite negative with L being more negative than R.   DISCUSSION:  Mr. Mcquiston is presently stable and he is asymptomatic with  regard to his very dense ventricular ectopy.  Because of this, I have  recommended a period of  watchful waiting.  I have cautioned the family  that if his heart failure symptoms worsen, then aggressive treatment of  his ectopy would be warranted as these could be contributing to his  cardiomyopathy and either antiarrhythmic drug therapy or consideration  for ablation would be warranted if he became symptomatic.     Doylene Canning. Ladona Ridgel, MD  Electronically Signed    GWT/MedQ  DD: 02/07/2008  DT: 02/08/2008  Job #: [id number]

## 2010-09-22 NOTE — Assessment & Plan Note (Signed)
[facility]                         ELECTROPHYSIOLOGY OFFICE NOTE   NAME:Mario Cline, Mario Cline                        MRN:          [id number]  DATE:10/10/2007                            DOB:          18-Dec-1928    Mr. Feighner returns today for followup.  He is a very pleasant, 77-year-  old male with a longstanding nonischemic cardiomyopathy, hypertension  and congestive heart failure, who is status post ICD insertion back in  February of this year.  He returns today for followup.  He admits to a  sedentary lifestyle, but he does have dyspnea with exertion.  He denies  chest pain or any intercurrent IC therapies.   CURRENT MEDICATIONS:  Include Aldactone 25 a day, lisinopril 20 daily,  Lasix 80 a day, Coreg 25 twice daily.   PHYSICAL EXAMINATION:  GENERAL:  He is a pleasant, well-appearing  elderly man in no distress.  VITAL SIGNS:  Blood pressure was 108/78, the pulse was 76 and regular,  respirations were 18.  Weight was 187 pounds.  NECK:  Revealed no jugular venousdistention.  No thyromegaly.  Trachea  is midline.  LUNGS:  Clear bilaterally to auscultation.  No wheezes, rales or rhonchi  are present.  CARDIOVASCULAR:  Regular rate and rhythm.  Normal S1 and S2.  There is a  soft S3 gallop present.  PMI was enlarged and laterally displaced.  EXTREMITIES:  Demonstrated no significant edema.   Interrogation of his defibrillator demonstrates a St. Jude current  device.  The P and R waves were 3 and 10 respectively.  The impedance  330 in the A, 540 in the V.  The threshold 0.5 at 0.5 in the A and 0.75  at 0.5 in the V.  The battery voltage was greater than 3.2 volts.  He  was 30% A-paced, 2% V-paced.  Today we turned his outputs in the atrium  down to 2 at 0.5 and the V down to 2.5 at 0.5.  He will follow up with  [place] in ICD clinic in February.  He will followup in our [employer].     Doylene Canning. Ladona Ridgel, MD  Electronically Signed    GWT/MedQ  DD:  10/10/2007  DT: 10/10/2007  Job #: [id number]

## 2010-09-22 NOTE — Assessment & Plan Note (Signed)
[facility]                       [place] CARDIOLOGY OFFICE NOTE   NAME:Mario Cline                        MRN:          [id number]  DATE:12/28/2007                            DOB:          16-Oct-1928    CARDIOLOGIST:  [name], MD, [facility].   ELECTROPHYSIOLOGIST:  Doylene Canning. Ladona Ridgel, MD.   PRIMARY CARE PHYSICIAN:  [name], MD.   REASON FOR VISIT:  Weakness.   HISTORY OF PRESENT ILLNESS:  Mario Cline is a 77 year old male patient  with a history of nonischemic cardiomyopathy with an EF of 25% who is  status post AICD implantation who underwent an upgrade to pacer/AICD in  February 2009 who presents to the office today for fatigue.  He last saw  Dr. Dietrich Pates in May.  At that time, his Coreg was increased to 25 mg  b.i.d. and his Aldactone and lisinopril were both consolidated into a  daily dose.  Over the last couple of months, he has noted fatigue.  He  also notes some lightheadedness, especially with standing.  He denies  syncope.  He denies any tachy palpitations.  He has had no ICD  therapies.  He denies chest pain.  He notes that since his Lasix was  increased several months ago, his breathing has gotten better.  He  denies significant dyspnea on exertion.  He describes NYHA class IIB  symptoms.  He denies orthopnea and PND.  He denies any significant pedal  edema.  His weights have been stable.   CURRENT MEDICATIONS:  Spirolactone 25 mg daily, Lisinopril 20 mg daily,  Lasix 80 mg daily, Coreg 25 mg b.i.d., Keflex 500 mg b.i.d. for 14 days.   ALLERGIES:  No known drug allergies.   REVIEW OF SYSTEMS:  Please see HPI.  He denies any fevers, chills,  cough, melena, hematochezia, hematuria, and dysuria.  He denies any skin  changes.  He denies any constipation and heat or cold intolerance.  Rest  of the review of systems are negative.   PHYSICAL EXAMINATION:  GENERAL:  He is a well-nourished and well-  developed male, in no  acute distress.  VITAL SIGNS:  Blood pressure is 100/70, pulse 76, and weight 184 pounds.  Orthostatic vital signs today revealed blood pressure of 106/79 with  pulse of 64 lying, 98/66 with pulse of 66 sitting, 90/62 with the pulse  of 66 standing.  HEENT:  Normal.  NECK:  Without JVD.  CARDIAC:  Normal S1 and S2.  Regular rate and rhythm with occasional  skipped beat.  LUNGS:  Clear to auscultation bilaterally.  ABDOMEN:  Nontender.  EXTREMITIES:  Without edema.  NEUROLOGIC:  He is alert and oriented x3.  Cranial II through XII  grossly intact and neck without thyromegaly.   Interrogation of his device today reveals it is functioning  appropriately.  He has had some atrial beats noted.  He has had no mode  switches.  It appears that this likely represents PACs and PVCs.  No ATP  therapies have been delivered.   ASSESSMENT AND PLAN:  1. Fatigue.  The patient is  experiencing fatigue and dizziness that      seems to be related to mild orthostatic hypotension.  His device is      functioning appropriately and no significant abnormalities have      been noted.  He probably has felt this way since his medications      were adjusted several months ago.  He would likely benefit from      staying on 25 mg of Coreg twice a day.  He has backup pacing      ability now with his pacemaker.  I have elected to decrease his      lisinopril at 10 mg a day.  I have also asked him to change his      Lasix to 80 mg daily alternating with 40 mg daily.  We will check      lab work, to include CMET, CBC, and TSH as well as a BNP to further      assess his fluid status, which seems to be optimal on the current      exam today.  He will be brought back in follow up with the nurse in      a couple of weeks to recheck his blood pressure.  2. Nonischemic cardiomyopathy with an ejection fraction of 25% and      chronic systolic congestive heart failure with [place] Heart      Association class IIB symptoms.   Overall, this seems to be stable.      As noted above, we will check a BNP to further assess his fluid      status.  The patient knows to monitor his weights and to go up on      his Lasix should his weight go up or if he is having increasing      shortness of breath or edema.   DISPOSITION:  The patient will follow up with Dr. Dietrich Pates in next 3 to  4 weeks for followup on the above.  Should he develop a change in his  symptoms or worsening symptoms before that visit, he knows to call us  and return sooner for followup.      Tereso Newcomer, PA-C  Electronically Signed      [name], MD, [facility]  Electronically Signed   SW/MedQ  DD: 12/28/2007  DT: 12/29/2007  Job #: [id number]   cc:   Lorin Picket A. Gerda Diss, MD

## 2010-09-22 NOTE — Letter (Signed)
June 11, 2008    Mario Cline Diss, MD  [address], [address]  [place], [place] [address]   RE:  Mario, Cline  MRN:  [id number]  /  DOB:  [birth year]   Dear Mario Cline:   Mario Cline returns to the office for continued assessment and treatment  of nonischemic cardiomyopathy.  Since his last visit, he underwent an  uncomplicated laparoscopic cholecystectomy.  He has had no further  episodes of right upper quadrant discomfort.  In fact, he has done quite  well in recent months with no significant health issues.  He has had  some mild diarrhea following surgery, which Dr. Dwain Sarna told him was  to be expected.  He has slacked off on his exercise program.   CURRENT MEDICATIONS:  1. Spironolactone 25 mg daily.  2. Furosemide 80 mg daily.  3. Carvedilol 12.5 mg b.i.d.  4. Lisinopril 10 mg daily.  5. Multivitamin.   PHYSICAL EXAMINATION:  GENERAL:  Pleasant gentleman, in no acute  distress.  VITAL SIGNS:  The weight is 187, unchanged.  Blood pressure 110/60,  heart rate 60 and regular, respirations 14 and unlabored.  NECK:  No  jugular venous distention; normal carotid upstrokes without bruits.  LUNGS:  Clear.  CARDIAC:  Normal first and second heart sounds.  Minimal basilar  systolic murmur.  THORAX:  AICD generator present in the left infraclavicular region.  ABDOMEN:  Soft and nontender; no masses.  EXTREMITIES:  Trace edema.   IMPRESSION:  Mario Cline continues to do astoundingly well.  He has not  been hospitalized for his heart disease other than for elective point  placement of a defibrillator, since onset in 1997.  He did have an  interval when his heart function returned to normal, but much of this  time, he has had an ejection fraction of less than or equal to 25%.  Since he is doing so well, we will continue his current medication as  is, check a chemistry profile, and plan to see this nice gentleman again  in 9 months.  He is advised to maintain his activity level, but to  try  and avoid exhaustion or significant dyspnea.    Sincerely,      [name], MD, [facility]  Electronically Signed    RMR/MedQ  DD: 06/11/2008  DT: 06/12/2008  Job #: [id number]

## 2010-09-22 NOTE — Letter (Signed)
June 15, 2007    Mario Cline, Mario Cline  [address]  [place], [place] [address]   RE:  Mario Cline, Mario Cline  MRN:  [id number]  /  DOB:  1929/03/10   Dear Mario Cline:   Mario Cline returns to the office for continued assessment and treatment  of nonischemic dilated cardiomyopathy.  You recall that this nice  gentleman had an unusual pattern of events with the development of  severe left ventricular systolic function in 1997, subsequent  echocardiograms in 2006 revealing normal left ventricular systolic  function and now with recurrent impairment and pulmonary edema.  He has  improved with medical therapy, but continues to be symptomatic.  He  describes atypical episodes of dyspnea that involve resting in certain  positions.  These are relieved with deep breathing.  He has been  relatively inactive without much in the way of exertional symptoms.  He  has had no edema and has had stable weight.  His wife reports that their  blood pressure cuff has been indicating a heart rate in the 30s of late.  The patient reports no dizziness and certainly no syncope.   CURRENT MEDICATIONS:  1. Lisinopril 20 mg daily.  2. Amlodipine 2.5 mg daily.  3. Furosemide 80 mg daily.  4. Carvedilol 6.25 mg b.i.d.  5. Spironolactone 25 mg daily.   On exam, a pleasant gentleman in no acute distress.  The weight is 181,  eight pounds less than 1 month ago.  Blood pressure 120/65, heart rate  72 by rhythm strip, which shows a junctional rhythm and ventricular  bigeminy.  Neck:  No jugular venous distention.  Lungs:  Clear.  Cardiac:  Normal first and second heart sounds; no third heart sound  present.  Extremities:  No edema.   Most recent chemistry profile shows a potassium of 5.2, BUN of 39 and  creatinine of 1.3.   IMPRESSION:  Mario Cline has diuresed significantly with the addition of  spironolactone.  This has caused borderline hyperkalemia.  We will  prescribe a low-potassium diet for now.  A BMET will be  rechecked in 3  weeks.  Amlodipine will be discontinued.  Carvedilol will be  discontinued due to the presence of a junctional rhythm.  He has shown a  progressive sensitivity to beta blockers, requiring his dose of Toprol  to be reduced from 100 mg to 50 mg and then to 25 mg.  Now he is  tolerating no carvedilol at all.  I will ask Dr. Ladona Ridgel to evaluate him  for possible pacemaker placement.  If this is to be done, a  biventricular device will probably be required, even though he does not  meet strict criteria for biventricular pacing.  Moreover, he is a  candidate for an AICD - this latter issue has not been discussed with  him.  His congestive heart failure appears to be fairly well  compensated.  I will plan to see this nice gentleman again in 1 month.    Sincerely,      Mario Cline, Mario Cline, Mario Cline  Electronically Signed    RMR/MedQ  DD: 06/15/2007  DT: 06/16/2007  Job #: [id number]

## 2010-09-22 NOTE — Procedures (Signed)
NAME:  Mario Cline, Mario Cline NO.:  [id number]   MEDICAL RECORD NO.:  [id number]          PATIENT TYPE:  OUT   LOCATION:  RAD                           [facility]AC[facility]:  [facility]   PHYSICIAN:  Pricilla Riffle, MD, FACCDATE OF BIRTH:  1929-04-07   DATE OF PROCEDURE:  05/01/2007  DATE OF DISCHARGE:                                ECHOCARDIOGRAM   REFERRING PHYSICIAN:  Dr. Gerda Diss   TEST INDICATION:  A 77 year old with CHF, test to evaluate.  Last  echocardiogram September 2006.   A 2-D echocardiogram with echo Doppler.  Left ventricle is mildly  dilated at 59 mm end-diastolic dimension.  The interventricular septum  and posterior wall are mildly thickened at 12 mm each.  Left atrium is  mildly dilated at 42 mm.  Right atrium is mildly dilated.  Right  ventricle appears to be mildly dilated.   Aortic root is minimally dilated at 40 mm.   The aortic valve is mildly thickened.  There is no stenosis.  There is  mild insufficiency (1/4).  Mitral valve is mildly thickened.  There is  at least moderate insufficiency (2/4 to 3/4).  Pulmonic valve is normal  with trace insufficiency.  Tricuspid valve is normal with mild  insufficiency.   Overall LV systolic function is depressed to severely depressed at  approximately 20-25% with diffuse hypokinesis, worse actually in the  inferior and posterior walls.   RV systolic function appears to be moderately depressed.   IVC is normal.  No pericardial effusion is seen.   From report of 2006 there is a significant change in RLV RV systolic  function, chamber sizes are now enlarged.  MR appears to be more  significant.      Pricilla Riffle, MD, [facility]  Electronically Signed     PVR/MEDQ  D:  05/01/2007  T:  05/02/2007  Job:  [id number]   cc:   [name], MD, [facility]  [address]  [place], [place] [address]   [name], MD  Fax: [phone]

## 2010-09-22 NOTE — Letter (Signed)
April 28, 2007    [name], MD.  [address], [address]  [place], [place], [address].   RE:  Mario Cline  MRN:  [id number]  /  DOB:  11/27/1928   Dear Mario Cline:   [name] returns to the office with increasing dyspnea.  As you know,  he has recently symptoms and other findings that have possibly reflected  a pulmonary infection and possibly congestive heart failure.  He reports  increasing exertional dyspnea, but also has had orthopnea and PND for a  matter of weeks if not months.  He had a fever one night.  He has had a  cough and wheezing, but no sputum production.  He was seen by you and  has had Furosemide increased from 40 mg daily to 60 mg daily.  His cough  has improved, but his dyspnea is no better.  He was also started on an  antibiotic four days ago.  His chest x-ray shows perihilar edema, which  was read as a possible infection, but more likely represents pulmonary  edema.  His BNP was moderately elevated 395.  Chemistry profile was  normal.  CBC was normal two months ago.   PHYSICAL EXAMINATION:  GENERAL:  A pleasant gentleman in no acute  distress.  VITAL SIGNS:  The weight is 187, one pound less than in December of last  year.  Blood pressure 115/75, heart rate is 60 and irregular,  respirations 18.  NECK:  Minimal jugulovenous distention; HJR is present.  Normal carotid  upstrokes without bruits.  LUNGS:  Clear.  CARDIAC:  Frequent ectopy; in this setting, it is difficult to determine  whether an S3 is present for sure.  ABDOMEN:  Substantial adipose tissue; otherwise benign.  EXTREMITIES:  0.5 to 1+ edema on the left; 1+ on the right.   EKG:  Junctional rhythm with frequent PVCs in a trigeminal pattern.   IMPRESSION:  Mr. Sortor appears to have recurrent congestive heart  failure.  This is unexpected in that left ventricular systolic function  had previously recovered to normal.  We will repeat his echocardiogram  to reassess.  He does have an arrhythmia,  but I doubt that that is  substantially contributing to congestive heart failure.  We will reduce  Toprol to 25 mg daily.  His dose of Lasix will be increased to 80 mg  daily.  We will recheck a chest x-ray and a metabolic profile in two  weeks at the time of his office reassessment.  He will follow weights at  home.    Sincerely,      [name], MD, [facility]  Electronically Signed    RMR/MedQ  DD: 04/28/2007  DT: 04/29/2007  Job #: [id number]

## 2010-09-25 NOTE — Procedures (Signed)
NAME:  Mario Cline, Mario Cline NO.:  [id number]   MEDICAL RECORD NO.:  [id number]          PATIENT TYPE:  OUT   LOCATION:  RAD                           FACILITY:  [facility]   PHYSICIAN:  Idylwood Bing, M.D. [facility] OF BIRTH:  04-12-1929   DATE OF PROCEDURE:  01/28/2005  DATE OF DISCHARGE:                                  ECHOCARDIOGRAM   REFERRING PHYSICIAN:  [name], M.D./Jeffrey Dorethea Clan, M.D.   CLINICAL DATA:  A 77 year old gentleman with hypertension.   M-MODE TRACINGS:  Aorta 3.8, left atrium 4.5, septum 1.6, posterior wall  1.3, LV diastole 3.9, LV systole 2.6.   RESULTS:  1.  Technically adequate echocardiographic study.  2.  Mild left atrial enlargement; normal right ventricular size and      function.  3.  Minimal aortic valvular sclerosis; aortic root at the upper limit of      normal in diameter; mild calcification of the proximal a ascending      aortic wall.  4.  Delicate mitral valve; mild prolapse; mild regurgitation.  5.  Normal pulmonic valve and proximal pulmonary artery.  6.  Normal tricuspid valve.  7.  Normal left ventricular size; mild hypertrophy; normal regional and      global function.  8.  IVC not well seen, probably normal.      [place] Bing, M.D. [facility]  Electronically Signed     RR/MEDQ  D:  01/28/2005  T:  01/29/2005  Job:  [id number]

## 2010-09-25 NOTE — Letter (Signed)
February 09, 2006     [name], MD  [address]  [place], [place] [address]   RE:  RAYMIR, FROMMELT  MRN:  [id number]  /  DOB:  16-Feb-1929   Dear Lorin Picket:   Mr. Garverick returns to the office after one year hiatus.  As you know, he was  seen by Dr. Dorethea Clan last year for follow-up of a history of nonischemic  cardiomyopathy with subsequent recovery of LV function.  Amlodipine and  furosemide were discontinued, but subsequently restarted, apparently for  recurrence of dyspnea.  Mr. Kimmons has done fairly well since then, but  claims continuing dyspnea.  This typically occurs at rest, especially when  he is retiring for the evening and in certain positions in bed.  There is no  clear orthopnea nor PND.  He has had mild intermittent pedal edema.   Of note, is a BNP level of 112 in March of this year but a subsequent BNP  level of 543 in July.  Chest x-ray at that time showed changes of COPD but  no evidence for congestive heart failure.  Mr. Eoff has no history of  cigarette smoking or lung disease.   CURRENT MEDICATIONS:  1. Lisinopril 20 mg daily.  2. Toprol 50 mg daily.  3. Amlodipine 2.5 mg daily.  4. Furosemide 20 mg daily.   PHYSICAL EXAMINATION:  GENERAL APPEARANCE:  A pleasant gentleman in no acute  distress.  VITAL SIGNS:  Weight is 184, 5 pounds less than last year.  Blood pressure  125/75, heart rate 65 and regular, respirations 16.  Oxygen saturation on  room air is 97%.  NECK:  No jugular venous distension.  LUNGS:  Clear.  CARDIOVASCULAR:  Normal first and second heart sounds; fourth heart sound  and modest systolic ejection murmur present.  ABDOMEN:  Soft and nontender; no masses; no organomegaly.  EXTREMITIES:  There was 0.5+ ankle edema.   IMPRESSION:  Mr. Kaminsky is doing generally well.  His history of dyspnea is  atypical, but his BNP level has increased substantially.  I have asked him  to increase his dose of furosemide to 40 mg daily.  We will follow  electrolytes and plan a return office visit in 2 months.    Sincerely,       [name], MD, [facility]     RMR/MedQ  DD:  02/09/2006  DT:  02/10/2006  Job #:  [id number]

## 2010-09-25 NOTE — Op Note (Signed)
NAME:  Mario Cline, Mario Cline NO.:  [id number]   MEDICAL RECORD NO.:  [id number]                   PATIENT TYPE:  AMB   LOCATION:  DAY                                  FACILITY:  [facility]   PHYSICIAN:  Lionel December, M.D.                 DATE OF BIRTH:  [birth year]   DATE OF PROCEDURE:  09/10/2003  DATE OF DISCHARGE:                                 OPERATIVE REPORT   PROCEDURE:  Total colonoscopy with terminal ileoscopy.   INDICATIONS FOR PROCEDURE:  [name] is a 77 year old Caucasian male with  recurrent right lower quadrant abdominal pain which is intermittent.  A few  weeks ago, he was treated for diverticulitis.  His sharp pain has resolved,  but this nagging pain keeps coming back.  He does not report any change in  his bowel habits.  He has sigmoid colon diverticulosis.  His last  colonoscopy was in March of 2000.  He is due for another one given family  history of colon carcinoma.  One of his brothers died of colon carcinoma in  his 47s.   The procedure and risks were reviewed with the patient, and informed consent  was obtained.   PREOPERATIVE MEDICATIONS:  Demerol 25 mg IV, Versed 3 mg IV in divided dose.   FINDINGS:  The procedure was performed in the endoscopy suite.  The  patient's vital signs and O2 saturations were monitored during the procedure  and remained stable.  The patient was placed in the left lateral recumbent  position and rectal examination performed.  No abnormality noted on external  or digital exam.  The Olympus videoscope was placed into the rectum and  advanced into the region of the sigmoid colon and beyond.  The preparation  was excellent.  The scope was passed into the cecum which was identified by  the appendiceal orifice and ileocecal valve.  A short segment of TI was also  examined and ws nor.  Pictures were taken for the record.  As the scope was  withdrawn, the colonic mucosa was carefully examined.  There were scattered  diverticula at the sigmoid colon.  None of these had any stigmata of  diverticulitis.  The rectal mucosa was normal.  The scope was retroflexed to  examine the anorectal junction which was unremarkable.  The endoscope was  straightened and withdrawn.  The patient tolerated the procedure well.   FINAL DIAGNOSES:  1. Sigmoid colon diverticulosis.  Otherwise normal colonoscopy.  Normal     terminal ileoscopy.  2. I suspect he may have an irritable bowel syndrome.   RECOMMENDATIONS:  1. High fiber diet.  2. Citrucel one tablespoon full daily.  3. Bentyl 10 mg before breakfast and 10 mg before evening meal.  4. If feels he is getting constipated, he can take Colace two tablets p.o.     q.h.s.  5. If his pain does not  respond to these measures, he will return for     reevaluation or office visit.      ___________________________________________                                            Lionel December, M.D.   NR/MEDQ  D:  09/10/2003  T:  09/10/2003  Job:  [id number]   cc:   Lorin Picket A. Gerda Diss, M.D.  [address]  [place]  [place] [address]  Fax: [phone]

## 2010-09-25 NOTE — Letter (Signed)
April 13, 2006    [name], MD  [address]  [place], [place] [address]   RE:  Mario Cline, Mario Cline  MRN:  [id number]  /  DOB:  1928/06/25   Dear Mario Cline:   Mario Cline returns to the office for continued assessment and treatment  of intermittent dyspnea with a history of mild nonischemic  cardiomyopathy. Since his last visit, he has done better. He notes  occasional mild and brief shortness of breath when he leans back, but  sleeps on only 1 pillow. Exercise tolerance is generally good. He  typically walks a mile without difficulty, but sometimes notes mild  shortness of breath. He has fatigue in his legs towards the end of his  walks.   CURRENT MEDICATIONS:  1. Lisinopril 20 mg daily.  2. Toprol 50 mg daily.  3. Amlodipine 2.5 mg daily.  4. Furosemide 40 mg daily.   PHYSICAL EXAMINATION:  GENERAL:  Pleasant gentleman in no acute  distress.  VITAL SIGNS:  The weight is 188, 4 pounds more than in October. Blood  pressure 125/85, heart rate 66 and regular, respirations 16.  NECK:  No jugular venous distention; no HJR. Normal carotid upstrokes  without bruits.  LUNGS:  Clear.  CARDIAC:  Normal first and second heart sounds; fourth heart sound  present; normal PMI.  ABDOMEN:  Soft and nontender; no organomegaly; no masses.  EXTREMITIES:  Trace edema; normal distal pulses.  NEUROMUSCULAR:  Normal strength and tone in the lower extremities;  normal reflexes; normal sensation.   IMPRESSION:  Mario Cline is doing generally well. Symptoms are adequately  controlled with his current medication. He does not have any significant  hypertension. Amlodipine will be discontinued. We will monitor repeat  chemistry profile, BNP level and TSH level in 3 months. The symptoms he  describes in his legs are nonspecific. He does not appear to have a  neuropathy or any vascular disorder. I doubt lumbosacral spine disease.  His symptoms could be caused by degenerative joint disease of the  hips  or knees. As long as there is no significant disability related to  these, further workup will probably not be productive. Vaccinations are  up-to-date. I will plan to see this nice gentleman again in 6 months.    Sincerely,      [name], MD, [facility]  Electronically Signed    RMR/MedQ  DD: 04/13/2006  DT: 04/13/2006  Job #: [id number]

## 2010-10-22 ENCOUNTER — Other Ambulatory Visit: Payer: Self-pay | Admitting: Internal Medicine

## 2010-10-22 ENCOUNTER — Ambulatory Visit (INDEPENDENT_AMBULATORY_CARE_PROVIDER_SITE_OTHER): Payer: Medicare Other | Admitting: *Deleted

## 2010-10-22 DIAGNOSIS — I4891 Unspecified atrial fibrillation: Secondary | ICD-10-CM

## 2010-10-22 DIAGNOSIS — I428 Other cardiomyopathies: Secondary | ICD-10-CM

## 2010-10-26 NOTE — Progress Notes (Signed)
icd remote check  

## 2010-12-04 ENCOUNTER — Encounter: Payer: Self-pay | Admitting: Internal Medicine

## 2010-12-08 ENCOUNTER — Encounter: Payer: Self-pay | Admitting: Internal Medicine

## 2010-12-08 ENCOUNTER — Ambulatory Visit (INDEPENDENT_AMBULATORY_CARE_PROVIDER_SITE_OTHER): Payer: Medicare Other | Admitting: Internal Medicine

## 2010-12-08 VITALS — BP 112/60 | HR 67 | Ht 68.0 in | Wt 188.6 lb

## 2010-12-08 DIAGNOSIS — R0609 Other forms of dyspnea: Secondary | ICD-10-CM

## 2010-12-08 DIAGNOSIS — G4710 Hypersomnia, unspecified: Secondary | ICD-10-CM

## 2010-12-08 DIAGNOSIS — R0989 Other specified symptoms and signs involving the circulatory and respiratory systems: Secondary | ICD-10-CM

## 2010-12-08 NOTE — Assessment & Plan Note (Signed)
Good compliance and control. No changes are indicated for now. He is encouraged to lose weight.

## 2010-12-08 NOTE — Assessment & Plan Note (Signed)
Related to cardiac status and deconditioning. PFT and 6 MWT were normal

## 2010-12-08 NOTE — Progress Notes (Signed)
Subjective:    Patient ID: Mario Cline, male    DOB: 04/24/1929, 77 y.o.   MRN: [id number]  HPI 12/08/10- 77 yoM followed for OSA, complicated by rhinitis, CM, AFib, chronic CHF.     Wife here Last here June 05, 2010- for review of normal PFT and CPAP 11 Advanced- used all night every night and wife confirms he doesn't snore through it.  Bothersome watery rhinorhea resolved on its own so he never tried ipratropium   Review of Systems Constitutional:   No-   weight loss, night sweats, fevers, chills, fatigue, lassitude. HEENT:   No-   headaches, difficulty swallowing, tooth/dental problems, sore throat,                  No-   sneezing, itching, ear ache, nasal congestion, post nasal drip,   CV:  No-   chest pain, orthopnea, PND, swelling in lower extremities, anasarca, dizziness, palpitations  GI:  No-   heartburn, indigestion, abdominal pain, nausea, vomiting, diarrhea,                 change in bowel habits, loss of appetite  Resp: No-   shortness of breath with exertion or at rest.  No-  excess mucus,             No-   productive cough,  No non-productive cough,  No-  coughing up of blood.              No-   change in color of mucus.  No- wheezing.    Skin: No-   rash or lesions.  GU: No-   dysuria, change in color of urine, no urgency or frequency.  No- flank pain.  MS:  No-   joint pain or swelling.  No- decreased range of motion.  No- back pain.  Psych:  No- change in mood or affect. No depression or anxiety.  No memory loss.      Objective:   Physical Exam  General- Alert, Oriented, Affect-appropriate, Distress- none acute   obese Skin- rash-none, lesions- none, excoriation- none Lymphadenopathy- none Head- atraumatic            Eyes- Gross vision intact, PERRLA, conjunctivae clear secretions            Ears- Hearing, canals            Nose- Clear, No-Septal dev, mucus, polyps, erosion, perforation             Throat- Mallampati IV , mucosa clear , drainage-  none, tonsils- atrophic Neck- flexible , trachea midline, no stridor , thyroid nl, carotid no bruit Chest - symmetrical excursion , unlabored           Heart/CV- RRR , no murmur , no gallop  , no rub, nl s1 s2                           - JVD- none , edema- none, stasis changes- none, varices- none           Lung- clear to P&A, wheeze- none, cough- none , dullness-none, rub- none           Chest wall-  Abd- tender-no, distended-no, bowel sounds-present, HSM- no Br/ Gen/ Rectal- Not done, not indicated Extrem- cyanosis- none, clubbing, none, atrophy- none, strength- nl Neuro- grossly intact to observation        Assessment & Plan:   No problem-specific assessment &  plan notes found for this encounter.

## 2010-12-08 NOTE — Patient Instructions (Signed)
Continue CPAP at 11.     Please call as needed

## 2010-12-14 ENCOUNTER — Ambulatory Visit: Payer: Self-pay | Admitting: Internal Medicine

## 2010-12-19 ENCOUNTER — Other Ambulatory Visit: Payer: Self-pay | Admitting: Cardiology

## 2011-01-18 ENCOUNTER — Encounter: Payer: Self-pay | Admitting: Cardiology

## 2011-01-20 ENCOUNTER — Ambulatory Visit: Payer: Medicare Other | Admitting: Cardiology

## 2011-01-20 ENCOUNTER — Other Ambulatory Visit: Payer: Self-pay | Admitting: Cardiology

## 2011-01-20 ENCOUNTER — Encounter: Payer: Self-pay | Admitting: Physician Assistant

## 2011-01-20 ENCOUNTER — Ambulatory Visit (INDEPENDENT_AMBULATORY_CARE_PROVIDER_SITE_OTHER): Payer: Medicare Other | Admitting: Physician Assistant

## 2011-01-20 DIAGNOSIS — R42 Dizziness and giddiness: Secondary | ICD-10-CM | POA: Insufficient documentation

## 2011-01-20 DIAGNOSIS — I4891 Unspecified atrial fibrillation: Secondary | ICD-10-CM

## 2011-01-20 DIAGNOSIS — I429 Cardiomyopathy, unspecified: Secondary | ICD-10-CM

## 2011-01-20 MED ORDER — FUROSEMIDE 40 MG PO TABS: 40.0000 mg | ORAL_TABLET | Freq: Every day | ORAL | Status: DC

## 2011-01-20 NOTE — Assessment & Plan Note (Signed)
Patient has a history of atrial fibrillation that happens approximately twice a year. Because of some infrequent he has not been anticoagulated with anything but aspirin. His last ICD check was in June and showed one mode switch that lasted 10-1/2 hours.  The patient's episode of dizziness and inability to communicate with his son could be related to hypotension but also could be a TIA. We are checking an MRI and he is scheduled to see Dr. Ladona Ridgel back October 3. He can decide whether or not to add long-term anticoagulation based on the results.

## 2011-01-20 NOTE — Assessment & Plan Note (Signed)
Patient has dizziness if he stoops over and stands up quickly. He has not had syncope. He is not orthostatic in the office. I will decrease his Lasix to 40 mg daily because his blood pressure is on the low side.  Patient also had an episode of dizziness and inability to respond his son that lasted 10-15 seconds. I discussed this with Dr. Dietrich Pates. He recommends an MRI of his brain to make sure he hasn't had a silent stroke.

## 2011-01-20 NOTE — Progress Notes (Signed)
Addended by: Demetrios Loll on: 01/20/2011 03:09 PM   Modules accepted: Orders

## 2011-01-20 NOTE — Patient Instructions (Signed)
**Note De-Identified Ayvah Caroll Obfuscation** Your physician has recommended you make the following change in your medication: decrease Lasix to 40 mg daily  Your physician has recommended you have a MRI of the head  Your physician recommends that you return for lab work in: today  Your physician recommends that you schedule a follow-up appointment in: 6 months

## 2011-01-20 NOTE — Assessment & Plan Note (Signed)
Patient has a nonischemic cardiomyopathy ejection fraction 30-35%. He is well compensated and has no evidence of fluid overload.

## 2011-01-20 NOTE — Progress Notes (Signed)
Addended by: Demetrios Loll on: 01/20/2011 03:38 PM   Modules accepted: Orders

## 2011-01-20 NOTE — Progress Notes (Signed)
HPI: This is an 77 year old male patient who has a history of nonischemic cardiomyopathy, ICD, congestive heart failure, paroxysmal atrial fibrillation, and stage II to 3 chronic kidney disease. Last 2-D echo in 2010 ejection fraction was 30-35%.  The patient had an episode approximately one week ago where he stood up from working on a lawnmower and was talking to his son and he was looking at him but could not respond. The whole episode lasted 10-15 seconds and then he cleared up. He says this is never happened before.He says he may of been a little bit dizzy. He could hear his son talking, but couldn't respond.  The patient also complains of dizziness if he stoops over. He denies any dizziness when he goes from a lying to a sitting position or from sitting to standing. He denies syncope.  The patient denies chest pain, palpitations, dyspnea, dyspnea on exertion. He is not as active as he used to be. He felt his knee started to go out when he tried to walk recently so he has stopped. He occasionally uses his stationary exercise bike.  No Known Allergies  Current Outpatient Prescriptions on File Prior to Visit  Medication Sig Dispense Refill  . aspirin 325 MG tablet Take 325 mg by mouth daily.        . carvedilol (COREG) 25 MG tablet Take 12.5 mg by mouth 2 (two) times daily with a meal.        . ipratropium (ATROVENT) 0.06 % nasal spray Place 2 sprays into the nose 4 (four) times daily.        Marland Kitchen LASIX 40 MG tablet TAKE 1 AND 1/2 TABLETS   DAILY.  45 each  3  . lisinopril (PRINIVIL,ZESTRIL) 20 MG tablet Take 10 mg by mouth daily.        . Multiple Vitamin (MULTIVITAMIN) tablet Take 1 tablet by mouth daily.        Marland Kitchen spironolactone (ALDACTONE) 25 MG tablet Take 25 mg by mouth daily.          Past Medical History  Diagnosis Date  . Nonischemic cardiomyopathy   . CHF (congestive heart failure)   . ASCVD (arteriosclerotic cardiovascular disease)   . Hypertension   . Orthostatic hypotension     . Obstructive sleep apnea   . DJD (degenerative joint disease)     Past Surgical History  Procedure Date  . Tonsillectomy   . Pacemaker insertion   . Cholecystectomy   . Lumbosacral discectomy and fusion 1985    Dr. Hope Pigeon    Family History  Problem Relation Age of Onset  . Heart failure Mother   . Heart attack Father     History   Social History  . Marital Status: Married    Spouse Name: N/A    Number of Children: N/A  . Years of Education: N/A   Occupational History  . Retired     Garment/textile technologist   Social History Main Topics  . Smoking status: Never Smoker   . Smokeless tobacco: Not on file  . Alcohol Use: Not on file  . Drug Use: Not on file  . Sexually Active: Not on file   Other Topics Concern  . Not on file   Social History Narrative  . No narrative on file    ROS: See HPI Eyes: glasses Ears:Negative for hearing loss, tinnitus Cardiovascular: Negative for chest pain, palpitations,irregular heartbeat, dyspnea, dyspnea on exertion, near-syncope, orthopnea, paroxysmal nocturnal dyspnia and syncope,edema, claudication, cyanosis,.  Respiratory:   Negative for cough, hemoptysis, shortness of breath, sleep disturbances due to breathing, sputum production and wheezing.   Endocrine: Negative for cold intolerance and heat intolerance.  Hematologic/Lymphatic: Negative for adenopathy and bleeding problem. Does not bruise/bleed easily.  Musculoskeletal: right knee problems.   Gastrointestinal: Negative for nausea, vomiting, reflux, abdominal pain, diarrhea, constipation.   Neurological: see above dictation  Allergic/Immunologic: Negative for environmental allergies.   PHYSICAL EXAM: Well-nournished, in no acute distress. Neck: No JVD, HJR, Bruit, or thyroid enlargement Lungs: No tachypnea, clear without wheezing, rales, or rhonchi Cardiovascular: RRR, PMI not displaced, 2/6 systolic murmur at the left sternal border, no gallops, bruit,  thrill, or heave. Abdomen: BS normal. Soft without organomegaly, masses, lesions or tenderness. Extremities: without cyanosis, clubbing or edema. Good distal pulses bilateral SKin: Warm, no lesions or rashes  Musculoskeletal: No deformities Neuro: no focal signs  BP 109/66  Pulse 58  Ht 5\' 8"  (1.727 m)  Wt 183 lb (83.008 kg)  BMI 27.82 kg/m2  SpO2 95% Echo Sept 28 2010: Study Conclusions    1. Left ventricle: The cavity size was mildly dilated. Wall      thickness was increased in a pattern of mild to moderate LVH.      Systolic function was moderately to severely reduced. The      estimated ejection fraction was in the range of 30% to 35%.      Hypokinesis of the anteroseptal and lateral myocardium. Virtual      akinesis of the inferior wall.   2. Aortic valve: Trivial regurgitation.   3. Mitral valve: Mild regurgitation.   4. Left atrium: The atrium was mildly dilated.  WUJ:WJXBJY sinus rhythm with first degree AV block, nonspecific ST-T wave changes

## 2011-01-21 ENCOUNTER — Encounter: Payer: Self-pay | Admitting: Physician Assistant

## 2011-01-22 LAB — BASIC METABOLIC PANEL
BUN: 39 mg/dL — ABNORMAL HIGH (ref 6–23)
Calcium: 9.4 mg/dL (ref 8.4–10.5)
Potassium: 4.7 mEq/L (ref 3.5–5.3)

## 2011-01-25 ENCOUNTER — Ambulatory Visit (HOSPITAL_COMMUNITY)
Admission: RE | Admit: 2011-01-25 | Discharge: 2011-01-25 | Disposition: A | Discharge status: Home / Self Care | Payer: Medicare Other | Source: Ambulatory Visit | Attending: Cardiology | Admitting: Cardiology

## 2011-01-25 ENCOUNTER — Inpatient Hospital Stay (HOSPITAL_COMMUNITY): Admission: RE | Admit: 2011-01-25 | Payer: Medicare Other | Source: Ambulatory Visit

## 2011-01-25 ENCOUNTER — Ambulatory Visit (HOSPITAL_COMMUNITY)
Admission: RE | Admit: 2011-01-25 | Discharge: 2011-01-25 | Disposition: A | Discharge status: Home / Self Care | Payer: Medicare Other | Source: Ambulatory Visit | Attending: Physician Assistant | Admitting: Physician Assistant

## 2011-01-25 DIAGNOSIS — R55 Syncope and collapse: Secondary | ICD-10-CM | POA: Insufficient documentation

## 2011-01-25 DIAGNOSIS — R42 Dizziness and giddiness: Secondary | ICD-10-CM

## 2011-01-25 DIAGNOSIS — I2510 Atherosclerotic heart disease of native coronary artery without angina pectoris: Secondary | ICD-10-CM | POA: Insufficient documentation

## 2011-01-25 DIAGNOSIS — I10 Essential (primary) hypertension: Secondary | ICD-10-CM | POA: Insufficient documentation

## 2011-01-25 DIAGNOSIS — R4789 Other speech disturbances: Secondary | ICD-10-CM | POA: Insufficient documentation

## 2011-01-27 ENCOUNTER — Other Ambulatory Visit: Payer: Self-pay

## 2011-01-27 ENCOUNTER — Encounter: Payer: Self-pay | Admitting: *Deleted

## 2011-01-27 DIAGNOSIS — I10 Essential (primary) hypertension: Secondary | ICD-10-CM

## 2011-01-29 LAB — BASIC METABOLIC PANEL
BUN: 19
Chloride: 106
GFR calc non Af Amer: >60
Glucose, Bld: 119 — ABNORMAL HIGH
Potassium: 4.7
Sodium: 142

## 2011-01-29 LAB — CBC
Hemoglobin: 14.8
RBC: 4.38
WBC: 7

## 2011-02-01 ENCOUNTER — Telehealth: Payer: Self-pay | Admitting: Cardiology

## 2011-02-01 LAB — BASIC METABOLIC PANEL
CO2: 27 mEq/L (ref 19–32)
Calcium: 9.2 mg/dL (ref 8.4–10.5)
Chloride: 107 mEq/L (ref 96–112)
Creat: 1.27 mg/dL (ref 0.50–1.35)
Sodium: 143 mEq/L (ref 135–145)

## 2011-02-01 NOTE — Telephone Encounter (Signed)
Results of CT Scan / tg

## 2011-02-09 LAB — COMPREHENSIVE METABOLIC PANEL
ALT: 16
ALT: 16
AST: 23
BUN: 22
CO2: 25
CO2: 28
Calcium: 9.4
Calcium: 9.7
Chloride: 107
Creatinine, Ser: 1.2
Creatinine, Ser: 1.13
GFR calc Af Amer: >60
GFR calc non Af Amer: 59 — ABNORMAL LOW
GFR calc non Af Amer: >60
Glucose, Bld: 156 — ABNORMAL HIGH
Glucose, Bld: 110 — ABNORMAL HIGH
Total Bilirubin: 0.7
Total Protein: 7.1

## 2011-02-09 LAB — CBC
HCT: 41.2
Hemoglobin: 14.5
Hemoglobin: 14.2
MCHC: 34.1
MCHC: 34.5
MCV: 102.3 — ABNORMAL HIGH
MCV: 101 — ABNORMAL HIGH
RBC: 4.15 — ABNORMAL LOW
RBC: 4.07 — ABNORMAL LOW
RDW: 12
WBC: 10.6 — ABNORMAL HIGH

## 2011-02-09 LAB — DIFFERENTIAL
Basophils Absolute: 0
Eosinophils Absolute: 0.1
Eosinophils Absolute: 0.1
Eosinophils Relative: 1
Lymphocytes Relative: 19
Lymphocytes Relative: 23
Lymphs Abs: 2
Lymphs Abs: 2.3
Neutro Abs: 6.9
Neutrophils Relative %: 73
Neutrophils Relative %: 69

## 2011-02-09 LAB — URINALYSIS, ROUTINE W REFLEX MICROSCOPIC
Bilirubin Urine: NEGATIVE
Glucose, UA: NEGATIVE
Ketones, ur: NEGATIVE
Protein, ur: NEGATIVE
pH: 5

## 2011-02-09 LAB — CK TOTAL AND CKMB (NOT AT ARMC)
Relative Index: INVALID
Total CK: 89

## 2011-02-10 ENCOUNTER — Encounter: Payer: Self-pay | Admitting: Internal Medicine

## 2011-02-10 ENCOUNTER — Ambulatory Visit (INDEPENDENT_AMBULATORY_CARE_PROVIDER_SITE_OTHER): Payer: Medicare Other | Admitting: Internal Medicine

## 2011-02-10 DIAGNOSIS — I4891 Unspecified atrial fibrillation: Secondary | ICD-10-CM

## 2011-02-10 DIAGNOSIS — I5022 Chronic systolic (congestive) heart failure: Secondary | ICD-10-CM

## 2011-02-10 DIAGNOSIS — I428 Other cardiomyopathies: Secondary | ICD-10-CM

## 2011-02-10 DIAGNOSIS — Z95810 Presence of automatic (implantable) cardiac defibrillator: Secondary | ICD-10-CM

## 2011-02-10 LAB — ICD DEVICE OBSERVATION
AL THRESHOLD: 0.75 V
ATRIAL PACING ICD: 50 pct
BATTERY VOLTAGE: 2.7373 V
DEVICE MODEL ICD: 499949
FVT: 0
HV IMPEDENCE: 46 Ohm
MODE SWITCH EPISODES: 4
PACEART VT: 0
RV LEAD AMPLITUDE: 6.9 mv
TOT-0009: 1
TOT-0010: 17
TZAT-0001FASTVT: 1
TZAT-0001SLOWVT: 1
TZAT-0012SLOWVT: 200 ms
TZAT-0019SLOWVT: 7.5 V
TZAT-0020FASTVT: 1 ms
TZAT-0020SLOWVT: 1 ms
TZON-0004SLOWVT: 35
TZON-0005FASTVT: 6
TZON-0005SLOWVT: 6
TZON-0010SLOWVT: 80 ms
TZST-0001FASTVT: 2
TZST-0001FASTVT: 5
TZST-0001SLOWVT: 4
TZST-0003FASTVT: 15 J
TZST-0003FASTVT: 36 J
TZST-0003SLOWVT: 15 J
TZST-0003SLOWVT: 25 J

## 2011-02-10 NOTE — Assessment & Plan Note (Signed)
The patient had one episode of atrial fibrillation several months ago for over 24 hour period. He had several additional short episodes. These are all asymptomatic. I discussed the treatment plans with the patient and his wife in detail. I have recommended systemic anticoagulation for thromboembolic prevention. After discussing the advantages and disadvantages of this approach as well as the alternatives, the patient would like not to take any systemic anticoagulation. He prefers to stay on aspirin alone. I have carefully explained to him that aspirin does not reduce his risk of stroke in any significant way compared to systemic anticoagulation. He understands and is willing to take this risk.

## 2011-02-10 NOTE — Patient Instructions (Signed)
Your physician recommends that you continue on your current medications as directed. Please refer to the Current Medication list given to you today.  Your physician recommends that you schedule a follow-up appointment in: 1 year  

## 2011-02-10 NOTE — Assessment & Plan Note (Signed)
His current symptoms are class II. He'll continue his current medical therapy and maintain a low-sodium diet.

## 2011-02-10 NOTE — Assessment & Plan Note (Signed)
His device is working normally. We'll plan a recheck in several months. 

## 2011-02-10 NOTE — Progress Notes (Signed)
HPI Mr. Appleyard returns today for followup. He is an 77 year old man with a long-standing nonischemic cardiomyopathy, chronic systolic heart failure, ejection fraction 20%, hypertension, and COPD. The patient denies chest pain, shortness of breath, or palpitations. He denies peripheral edema. His wife who is with him today states that his sodium intake has been reduced. He denies any recent ICD shocks. No Known Allergies   Current Outpatient Prescriptions  Medication Sig Dispense Refill  . aspirin 325 MG tablet Take 325 mg by mouth daily.        . carvedilol (COREG) 25 MG tablet Take 12.5 mg by mouth 2 (two) times daily with a meal.        . furosemide (LASIX) 40 MG tablet Take 1 tablet (40 mg total) by mouth daily. Dose decrease  30 tablet  3  . ipratropium (ATROVENT) 0.06 % nasal spray Place 2 sprays into the nose 4 (four) times daily.        Marland Kitchen lisinopril (PRINIVIL,ZESTRIL) 20 MG tablet Take 10 mg by mouth daily.        . Multiple Vitamin (MULTIVITAMIN) tablet Take 1 tablet by mouth daily.        Marland Kitchen spironolactone (ALDACTONE) 25 MG tablet Take 25 mg by mouth daily.           Past Medical History  Diagnosis Date  . Nonischemic cardiomyopathy   . CHF (congestive heart failure)   . ASCVD (arteriosclerotic cardiovascular disease)   . Hypertension   . Orthostatic hypotension   . Obstructive sleep apnea   . DJD (degenerative joint disease)     ROS:   All systems reviewed and negative except as noted in the HPI.   Past Surgical History  Procedure Date  . Tonsillectomy   . Pacemaker insertion   . Cholecystectomy   . Lumbosacral discectomy and fusion 1985    Dr. Hope Pigeon     Family History  Problem Relation Age of Onset  . Heart failure Mother   . Heart attack Father      History   Social History  . Marital Status: Married    Spouse Name: N/A    Number of Children: N/A  . Years of Education: N/A   Occupational History  . Retired     Corporate treasurer   Social History Main Topics  . Smoking status: Never Smoker   . Smokeless tobacco: Not on file  . Alcohol Use: Not on file  . Drug Use: Not on file  . Sexually Active: Not on file   Other Topics Concern  . Not on file   Social History Narrative  . No narrative on file     BP 104/58  Pulse 57  Ht 5\' 8"  (1.727 m)  Wt 188 lb (85.276 kg)  BMI 28.59 kg/m2  Physical Exam:  Well appearing NAD HEENT: Unremarkable Neck:  No JVD, no thyromegally Lymphatics:  No adenopathy Back:  No CVA tenderness Lungs:  Clear with no wheezes, rales, or rhonchi. Well-healed ICD incision. HEART:  Regular rate rhythm, no murmurs, no rubs, no clicks Abd:  soft, positive bowel sounds, no organomegally, no rebound, no guarding Ext:  2 plus pulses, no edema, no cyanosis, no clubbing Skin:  No rashes no nodules Neuro:  CN II through XII intact, motor grossly intact  DEVICE  Normal device function.  See PaceArt for details.   Assess/Plan:

## 2011-02-11 ENCOUNTER — Telehealth: Payer: Self-pay | Admitting: Internal Medicine

## 2011-02-11 NOTE — Telephone Encounter (Signed)
Patient was seen yesterday by Dr Ladona Ridgel and needs to know if it would be okay to have cataract surgery [facility] in [place]. [phone] office number.

## 2011-02-11 NOTE — Telephone Encounter (Signed)
Okay to have cataract surgery per Dr Ladona Ridgel

## 2011-02-11 NOTE — Telephone Encounter (Signed)
Sending to [place] so she can talk to Dr Ladona Ridgel about this matter

## 2011-02-16 ENCOUNTER — Encounter: Payer: Self-pay | Admitting: Internal Medicine

## 2011-02-17 ENCOUNTER — Telehealth: Payer: Self-pay | Admitting: Internal Medicine

## 2011-02-17 NOTE — Telephone Encounter (Signed)
Patient wife is calling to get an okay to stop asprin on 02/19/11 for cataracts surgery on 03/01/11 with Dr [facility] in [place]. Dr Dione Booze number is [phone].

## 2011-02-18 NOTE — Telephone Encounter (Signed)
Should be reasonable to temporally hold aspirin at relatively low risk from a cardiac perspective.  Please relay message to Dr. Laruth Bouchard office.

## 2011-03-08 ENCOUNTER — Other Ambulatory Visit: Payer: Self-pay | Admitting: Cardiology

## 2011-04-12 ENCOUNTER — Other Ambulatory Visit: Payer: Self-pay

## 2011-04-12 ENCOUNTER — Emergency Department (HOSPITAL_COMMUNITY)
Admission: EM | Admit: 2011-04-12 | Discharge: 2011-04-12 | Disposition: A | Discharge status: Home / Self Care | Payer: Medicare Other | Attending: Emergency Medicine | Admitting: Emergency Medicine

## 2011-04-12 ENCOUNTER — Encounter (HOSPITAL_COMMUNITY): Payer: Self-pay

## 2011-04-12 ENCOUNTER — Emergency Department (HOSPITAL_COMMUNITY): Payer: Medicare Other

## 2011-04-12 DIAGNOSIS — I4891 Unspecified atrial fibrillation: Secondary | ICD-10-CM | POA: Insufficient documentation

## 2011-04-12 DIAGNOSIS — Z7982 Long term (current) use of aspirin: Secondary | ICD-10-CM | POA: Insufficient documentation

## 2011-04-12 DIAGNOSIS — Z950 Presence of cardiac pacemaker: Secondary | ICD-10-CM | POA: Insufficient documentation

## 2011-04-12 DIAGNOSIS — M1990 Unspecified osteoarthritis, unspecified site: Secondary | ICD-10-CM | POA: Insufficient documentation

## 2011-04-12 DIAGNOSIS — G4733 Obstructive sleep apnea (adult) (pediatric): Secondary | ICD-10-CM | POA: Insufficient documentation

## 2011-04-12 DIAGNOSIS — I509 Heart failure, unspecified: Secondary | ICD-10-CM | POA: Insufficient documentation

## 2011-04-12 DIAGNOSIS — I2510 Atherosclerotic heart disease of native coronary artery without angina pectoris: Secondary | ICD-10-CM | POA: Insufficient documentation

## 2011-04-12 DIAGNOSIS — I428 Other cardiomyopathies: Secondary | ICD-10-CM | POA: Insufficient documentation

## 2011-04-12 DIAGNOSIS — I4949 Other premature depolarization: Secondary | ICD-10-CM | POA: Insufficient documentation

## 2011-04-12 LAB — DIFFERENTIAL
Lymphocytes Relative: 27 % (ref 12–46)
Lymphs Abs: 1.9 10*3/uL (ref 0.7–4.0)
Monocytes Relative: 10 % (ref 3–12)
Neutro Abs: 4.2 10*3/uL (ref 1.7–7.7)
Neutrophils Relative %: 61 % (ref 43–77)

## 2011-04-12 LAB — BASIC METABOLIC PANEL
BUN: 40 mg/dL — ABNORMAL HIGH (ref 6–23)
Chloride: 110 mEq/L (ref 96–112)
Glucose, Bld: 125 mg/dL — ABNORMAL HIGH (ref 70–99)
Potassium: 4.2 mEq/L (ref 3.5–5.1)
Sodium: 142 mEq/L (ref 135–145)

## 2011-04-12 LAB — CBC
Hemoglobin: 13.3 g/dL (ref 13.0–17.0)
RBC: 3.87 MIL/uL — ABNORMAL LOW (ref 4.22–5.81)
WBC: 6.9 10*3/uL (ref 4.0–10.5)

## 2011-04-12 LAB — POCT I-STAT TROPONIN I

## 2011-04-12 MED ORDER — DILTIAZEM HCL 25 MG/5ML IV SOLN
20.0000 mg | Freq: Once | INTRAVENOUS | Status: AC
  Administered 2011-04-12: 20 mg via INTRAVENOUS
  Filled 2011-04-12: qty 5

## 2011-04-12 MED ORDER — SODIUM CHLORIDE 0.9 % IV BOLUS (SEPSIS): 500.0000 mL | Freq: Once | INTRAVENOUS | Status: DC

## 2011-04-12 MED ORDER — SODIUM CHLORIDE 0.9 % IV SOLN: Freq: Once | INTRAVENOUS | Status: DC

## 2011-04-12 NOTE — ED Notes (Signed)
edp back in to reeval. Pt denies pain or sob. Repeat ekg completed

## 2011-04-12 NOTE — ED Provider Notes (Signed)
History    This chart was scribed for [employer]. Colon Branch, MD, MD by Smitty Pluck. The patient was seen in room APA18 and the patient's care was started at 7:12AM.   CSN: [id number] Arrival date & time: 04/12/2011  6:31 AM   First MD Initiated Contact with Patient 04/12/11 [phone]      Chief Complaint  Patient presents with  . Palpitations    (Consider location/radiation/quality/duration/timing/severity/associated sxs/prior treatment) Patient is a 77 y.o. male presenting with palpitations. The history is provided by the patient and the spouse.  Palpitations    Mario Cline is a 77 y.o. male who presents to the Emergency Department complaining of palpitations onset today. Pt's wife states he has been feeling tired lately. Pt states he takes his blood pressure and heart rate every night and this morning he noticed that his heartrate was 110 but normally 60. Pt denies fever, chills, cough, cold, SOB and chest pain. Pt has had episodes of atrial fibulation's before once in December and July of last year. Pt has pacemaker for 3 years. Pt denies being shocked since placement of fibulator.  PCP is Dr. Ouida Sills  Past Medical History  Diagnosis Date  . Nonischemic cardiomyopathy   . CHF (congestive heart failure)   . ASCVD (arteriosclerotic cardiovascular disease)   . Hypertension   . Orthostatic hypotension   . Obstructive sleep apnea   . DJD (degenerative joint disease)     Past Surgical History  Procedure Date  . Tonsillectomy   . Pacemaker insertion   . Cholecystectomy   . Lumbosacral discectomy and fusion 1985    Dr. Hope Pigeon    Family History  Problem Relation Age of Onset  . Heart failure Mother   . Heart attack Father     History  Substance Use Topics  . Smoking status: Never Smoker   . Smokeless tobacco: Not on file  . Alcohol Use: No      Review of Systems  Cardiovascular: Positive for palpitations.  All other systems reviewed and are negative.   10 Systems  reviewed and are negative for acute change except as noted in the HPI.  Allergies  Review of patient's allergies indicates no known allergies.  Home Medications   Current Outpatient Rx  Name Route Sig Dispense Refill  . ASPIRIN 325 MG PO TABS Oral Take 325 mg by mouth daily.      Marland Kitchen COREG 25 MG PO TABS  TAKE (1/2) TABLET BY MOUTH TWICE DAILY. 30 each 5  . FUROSEMIDE 40 MG PO TABS Oral Take 1 tablet (40 mg total) by mouth daily. Dose decrease 30 tablet 3  . LISINOPRIL 20 MG PO TABS Oral Take 10 mg by mouth daily.      Marland Kitchen ONE-DAILY MULTI VITAMINS PO TABS Oral Take 1 tablet by mouth daily.      Marland Kitchen SPIRONOLACTONE 25 MG PO TABS Oral Take 25 mg by mouth daily.        BP 129/77  Pulse 82  Temp(Src) 97.5 F (36.4 C) (Oral)  Resp 20  Ht 5\' 10"  (1.778 m)  Wt 185 lb (83.915 kg)  BMI 26.54 kg/m2  SpO2 96%  Physical Exam  Nursing note and vitals reviewed. Constitutional: He is oriented to person, place, and time. He appears well-developed and well-nourished. No distress.  HENT:  Head: Normocephalic and atraumatic.  Right Ear: External ear normal.  Left Ear: External ear normal.  Mouth/Throat: Oropharynx is clear and moist.  Eyes: Conjunctivae and EOM are normal.  Pupils are equal, round, and reactive to light.  Neck: Normal range of motion. Neck supple. No tracheal deviation present.  Cardiovascular: An irregularly irregular rhythm present.  No murmur heard.      No bruits   Pulmonary/Chest: Effort normal and breath sounds normal. No respiratory distress. He has no wheezes.       Perfectly clear  Abdominal: Soft. Bowel sounds are normal. He exhibits no distension. There is no tenderness.  Musculoskeletal: He exhibits no edema.  Neurological: He is alert and oriented to person, place, and time.  Skin: Skin is warm and dry.  Psychiatric: He has a normal mood and affect. His behavior is normal.    ED Course  Procedures (including critical care time)  DIAGNOSTIC STUDIES: Oxygen  Saturation is 96% on room air, normal by my interpretation.    COORDINATION OF CARE:  10:15 Consult: EDP speaks with Dr. Diona Browner concerning patient. Dr. Diona Browner recommends no change in mediation and will have office call patient for follow up visit  10 :20 Recheck: Pt is back in intrinisic rhythm pace in 50s. Pt has no atrial fibulation.    10:29 Appointment set up with Dr. Ladona Ridgel for Monday, 04/19/11 at 11:15 AM.   Labs Reviewed  CBC - Abnormal; Notable for the following:    RBC 3.87 (*)    MCV 101.3 (*)    MCH 34.4 (*)    All other components within normal limits  BASIC METABOLIC PANEL - Abnormal; Notable for the following:    Glucose, Bld 125 (*)    BUN 40 (*)    GFR calc non Af Amer 52 (*)    GFR calc Af Amer 60 (*)    All other components within normal limits  DIFFERENTIAL  POCT I-STAT TROPONIN I  I-STAT TROPONIN I   Dg Chest Portable 1 View  04/12/2011  *RADIOLOGY REPORT*  Clinical Data: Atrial fibrillation.  CHF.  Hypertension.  PORTABLE CHEST - 1 VIEW  Comparison: 12/11/2008  Findings: Pacer / AICD device unchanged in position with leads right atrium right ventricle.  No lead discontinuity.  Patient rotated minimally right. Midline trachea.  Moderate cardiomegaly. No pleural effusion or pneumothorax.  No congestive failure.  Right base scarring.  IMPRESSION: Cardiomegaly and low lung volumes. No acute findings.  Original Report Authenticated By: Consuello Bossier, M.D.    Date: 04/12/2011  0454  Rate:89  Rhythm: atrial fibrillation and premature ventricular contractions (PVC)  QRS Axis: left  Intervals: atrial fibrillation  ST/T Wave abnormalities: nonspecific ST/T changes  Conduction Disutrbances:none  Narrative Interpretation: intrinsic pacemaker, atrial fibrillation  Old EKG Reviewed: changes noted   EKG #2 after cardizem  Date: 04/12/2011 1016  Rate: 102   Rhythm: paced rhythm with pvcs  QRS Axis: left  Intervals: pvcs  ST/T Wave abnormalities: nonspecific ST/T  changes  Conduction Disutrbances:none  Narrative Interpretation:   Old EKG Reviewed: atrial fibrillation no longer present.   No diagnosis found.    MDM  Patient with AICD, nonischemic cardiomyopathy with EF ,30% here with atrial fibrillation. Given 20 mg cardizem with conversion to intrinsic rhythm, paced rhythm with PVCs. Labs unremarkable, chest xray normal. Spoke with Dr. Diona Browner re any further intervention in the ER. Pt stable in ED with no significant deterioration in condition.The patient appears reasonably screened and/or stabilized for discharge and I doubt any other medical condition or other [facility] requiring further screening, evaluation, or treatment in the ED at this time prior to discharge.  I personally performed the services  described in this documentation, which was scribed in my presence. The recorded information has been reviewed and considered.  MDM Reviewed: previous chart, nursing note and vitals Reviewed previous: labs, ECG and x-ray Interpretation: labs, ECG and x-ray Total time providing critical care: 40 minutes. Consults: cardiology         Nicoletta Dress. Colon Branch, MD 04/12/11 1035

## 2011-04-12 NOTE — ED Notes (Signed)
Called Dr. Gracy Racer at this time through [place]. Dx: A Fib

## 2011-04-12 NOTE — ED Notes (Signed)
Pt waiting to be reval and disposition

## 2011-04-12 NOTE — ED Notes (Signed)
appt made for Monday 04/19/11 @11 :15 with DR. Ladona Ridgel . DR. Strand aware. Daielle Melcher

## 2011-04-12 NOTE — ED Notes (Signed)
Reports that heartrate is normally 60-- is 110 now

## 2011-04-12 NOTE — ED Notes (Signed)
Pt states he has been checking his pulse frequently and since last night it has been "running high"   Pt denies cp, sob, or other complaints, just concerned about it running high.   Pt states he was told a couple of times in the past year that he had Afib but is not currently on meds for same although his cardiologist is aware of this.

## 2011-04-19 ENCOUNTER — Ambulatory Visit (INDEPENDENT_AMBULATORY_CARE_PROVIDER_SITE_OTHER): Payer: Medicare Other | Admitting: Internal Medicine

## 2011-04-19 ENCOUNTER — Encounter: Payer: Self-pay | Admitting: Internal Medicine

## 2011-04-19 DIAGNOSIS — Z95810 Presence of automatic (implantable) cardiac defibrillator: Secondary | ICD-10-CM

## 2011-04-19 DIAGNOSIS — I4891 Unspecified atrial fibrillation: Secondary | ICD-10-CM

## 2011-04-19 DIAGNOSIS — I5022 Chronic systolic (congestive) heart failure: Secondary | ICD-10-CM

## 2011-04-19 DIAGNOSIS — I428 Other cardiomyopathies: Secondary | ICD-10-CM

## 2011-04-19 LAB — ICD DEVICE OBSERVATION
AL AMPLITUDE: 0.5 mv
AL IMPEDENCE ICD: 300 Ohm
BATTERY VOLTAGE: 2.6771 V
MODE SWITCH EPISODES: 1
TOT-0007: 1
TOT-0008: 0
TOT-0010: 19
TZAT-0013SLOWVT: 3
TZAT-0019SLOWVT: 7.5 V
TZAT-0020SLOWVT: 1 ms
TZON-0004SLOWVT: 35
TZON-0005SLOWVT: 6
TZON-0010FASTVT: 80 ms
TZST-0001FASTVT: 2
TZST-0001FASTVT: 5
TZST-0001SLOWVT: 2
TZST-0001SLOWVT: 4
TZST-0003FASTVT: 36 J
TZST-0003SLOWVT: 25 J
VF: 0

## 2011-04-19 MED ORDER — DABIGATRAN ETEXILATE MESYLATE 75 MG PO CAPS: 75.0000 mg | ORAL_CAPSULE | Freq: Two times a day (BID) | ORAL | Status: DC

## 2011-04-19 NOTE — Assessment & Plan Note (Signed)
His ventricular rate is well controlled. I have recommended he start Pradaxa 75 mg twice daily as creatinine clearance just over 40 and at times lower. He will continue his other meds.

## 2011-04-19 NOTE — Assessment & Plan Note (Signed)
His symptoms remain class 2 and have not worsened with his atrial fib.

## 2011-04-19 NOTE — Patient Instructions (Signed)
Your physician has recommended you make the following change in your medication: Start taking Pradaxa 75 mg twice daily  Your physician recommends that you schedule a follow-up appointment in: 6 months

## 2011-04-19 NOTE — Progress Notes (Signed)
HPI [name] returns today for followup. He is a pleasant 77 yo man with a h/o PAF and HTN. When I last saw him I recommended starting anti-coagulation but he refused. He has gone into atrial fib several weeks ago. He feels well. He noted that his heart rate was increased some. No chest pain, sob, or syncope. He is now willing to consider starting his anti-coagulation.  No Known Allergies   Current Outpatient Prescriptions  Medication Sig Dispense Refill  . acetaminophen (TYLENOL) 500 MG tablet Take 500 mg by mouth every 6 (six) hours as needed. For headache       . aspirin 325 MG tablet Take 325 mg by mouth daily.        Marland Kitchen COREG 25 MG tablet TAKE (1/2) TABLET BY MOUTH TWICE DAILY.  30 each  5  . furosemide (LASIX) 40 MG tablet Take 1 tablet (40 mg total) by mouth daily. Dose decrease  30 tablet  3  . lisinopril (PRINIVIL,ZESTRIL) 20 MG tablet Take 10 mg by mouth daily.       . Multiple Vitamins-Minerals (MULTIVITAMINS THER. W/MINERALS) TABS Take 1 tablet by mouth daily.        Marland Kitchen spironolactone (ALDACTONE) 25 MG tablet Take 25 mg by mouth daily.        . dabigatran (PRADAXA) 75 MG CAPS Take 1 capsule (75 mg total) by mouth every 12 (twelve) hours.  60 capsule  6     Past Medical History  Diagnosis Date  . Nonischemic cardiomyopathy   . CHF (congestive heart failure)   . ASCVD (arteriosclerotic cardiovascular disease)   . Hypertension   . Orthostatic hypotension   . Obstructive sleep apnea   . DJD (degenerative joint disease)     ROS:   All systems reviewed and negative except as noted in the HPI.   Past Surgical History  Procedure Date  . Tonsillectomy   . Pacemaker insertion   . Cholecystectomy   . Lumbosacral discectomy and fusion 1985    Dr. Hope Pigeon     Family History  Problem Relation Age of Onset  . Heart failure Mother   . Heart attack Father      History   Social History  . Marital Status: Married    Spouse Name: N/A    Number of Children: N/A  .  Years of Education: N/A   Occupational History  . Retired     Garment/textile technologist   Social History Main Topics  . Smoking status: Never Smoker   . Smokeless tobacco: Not on file  . Alcohol Use: No  . Drug Use: No  . Sexually Active: No   Other Topics Concern  . Not on file   Social History Narrative  . No narrative on file     BP 104/68  Pulse 65  Resp 16  Ht 5\' 10"  (1.778 m)  Wt 84.823 kg (187 lb)  BMI 26.83 kg/m2  Physical Exam:  Well appearing elderly man, NAD HEENT: Unremarkable Neck:  No JVD, no thyromegally Lymphatics:  No adenopathy Back:  No CVA tenderness Lungs:  Clear with no wheezes, rales, or rhonchi HEART:  Regular rate rhythm, no murmurs, no rubs, no clicks Abd:  soft, positive bowel sounds, no organomegally, no rebound, no guarding Ext:  2 plus pulses, no edema, no cyanosis, no clubbing Skin:  No rashes no nodules Neuro:  CN II through XII intact, motor grossly intact  DEVICE  Normal device function.  See PaceArt for  details.   Assess/Plan:

## 2011-04-19 NOTE — Assessment & Plan Note (Signed)
Normal device function. Will follow.

## 2011-05-01 ENCOUNTER — Other Ambulatory Visit: Payer: Self-pay | Admitting: Cardiology

## 2011-05-03 ENCOUNTER — Other Ambulatory Visit: Payer: Self-pay | Admitting: Cardiology

## 2011-05-10 ENCOUNTER — Telehealth: Payer: Self-pay | Admitting: Physician Assistant

## 2011-05-10 ENCOUNTER — Telehealth: Payer: Self-pay | Admitting: Cardiology

## 2011-05-10 NOTE — Telephone Encounter (Signed)
Returning outpatient phone call from patient's wife. He started Pradaxa after 04/19/11 and has had intermittent indigestion over the last few days. No hematemesis, melena, BRBPR or any other changes. I discussed this with Dr. Jens Som who is on call, who recommended for the patient to continue the medicine so long as he continues to feel with without problem otherwise. This was conveyed to his wife who expressed understanding and gratitude.  They have an appt with Dr. Dietrich Pates in 2 days and will discuss it with him then.  Dayna Dunn PA-C

## 2011-05-10 NOTE — Telephone Encounter (Signed)
PT THINKS THAT PRADAXA IS CAUSING HIM TO HAVE HEART BURN. HE WANT TO KNOW IF HE SHOULD CONTINUE TAKING IT.

## 2011-05-12 ENCOUNTER — Ambulatory Visit (INDEPENDENT_AMBULATORY_CARE_PROVIDER_SITE_OTHER): Payer: BLUE CROSS/BLUE SHIELD | Admitting: Cardiology

## 2011-05-12 ENCOUNTER — Encounter: Payer: Self-pay | Admitting: Cardiology

## 2011-05-12 DIAGNOSIS — I4891 Unspecified atrial fibrillation: Secondary | ICD-10-CM

## 2011-05-12 DIAGNOSIS — I951 Orthostatic hypotension: Secondary | ICD-10-CM | POA: Insufficient documentation

## 2011-05-12 DIAGNOSIS — I428 Other cardiomyopathies: Secondary | ICD-10-CM

## 2011-05-12 NOTE — Assessment & Plan Note (Addendum)
Patient started on Pradaxa a  month ago and complains of indigestion with every meal. I've ask him to take it after eating something. We offered him Prilosec 20 mg once daily. He wants to hold off on this and see if it improves with food.

## 2011-05-12 NOTE — Patient Instructions (Signed)
Follow-up with Dr. Dietrich Pates in 6 months

## 2011-05-12 NOTE — Progress Notes (Signed)
HPI:  This is a pleasant [name] who is here today for 6 month followup. He recently saw Dr. Sharrell Ku on 04/19/11 and was started on Pradaxa for atrial fibrillation. He has been reluctant to take anything in the past but has agreed to start this. He now complains of significant indigestion with every meal. He has never had reflux in the past. He takes his back the with a little bit of food b.i.d.  Patient has a history of a nonischemic cardiomyopathy Campath induced atrial fibrillation and status post ICD implant.   No Known Allergies  Current Outpatient Prescriptions on File Prior to Visit  Medication Sig Dispense Refill  . acetaminophen (TYLENOL) 500 MG tablet Take 500 mg by mouth every 6 (six) hours as needed. For headache       . ALDACTONE 25 MG tablet TAKE ONE TABLET DAILY.  90 each  0  . COREG 25 MG tablet TAKE (1/2) TABLET BY MOUTH TWICE DAILY.  30 each  5  . dabigatran (PRADAXA) 75 MG CAPS Take 1 capsule (75 mg total) by mouth every 12 (twelve) hours.  60 capsule  6  . furosemide (LASIX) 40 MG tablet Take 1 tablet (40 mg total) by mouth daily. Dose decrease  30 tablet  3  . lisinopril (PRINIVIL,ZESTRIL) 20 MG tablet Take 10 mg by mouth daily.       . Multiple Vitamins-Minerals (MULTIVITAMINS THER. W/MINERALS) TABS Take 1 tablet by mouth daily.          Past Medical History  Diagnosis Date  . Nonischemic cardiomyopathy     Cath in 1997-nonobstructive disease with normal EF; h/o CHF; EF-45% in '03, 25% in '08, 25-30% in '11;   . Hypertension   . Orthostatic hypotension   . Obstructive sleep apnea 2002    CPAP prescribed  . DJD (degenerative joint disease)   . AICD (automatic cardioverter/defibrillator) present 06/2007    AICD/dual-chamber pacemaker - (St. Jude)- 2/09;   . 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  Tonsillectomy   . Pacemaker insertion   . Cholecystectomy   . Posterior laminectomy / decompression lumbar spine 1985    +fusion; Dr. Hope Pigeon  . Cardiac defibrillator placement 06/2007    Family History  Problem Relation Age of Onset  . Heart failure Mother   . Heart attack Father   . Cancer Brother     x2    History   Social History  . Marital Status: Married    Spouse Name: N/A    Number of Children: 2  . Years of Education: N/A   Occupational History  . Retired     Garment/textile technologist   Social History Main Topics  . Smoking status: Never Smoker   . Smokeless tobacco: Never Used  . Alcohol Use: 0.5 oz/week    1 drink(s) per week  . Drug Use: No  . Sexually Active: No   Other Topics Concern  . Not on file   Social History Narrative  . No narrative on file    ROS: See HPI Eyes: glasses Ears:Negative for hearing loss, tinnitus Cardiovascular: Negative for chest pain, palpitations,irregular heartbeat,  near-syncope, orthopnea, paroxysmal nocturnal dyspnia and syncope,edema, claudication, cyanosis,.  Respiratory:   Negative for cough, hemoptysis, sleep disturbances due to breathing, sputum production and wheezing.   Endocrine: Negative  for cold intolerance and heat intolerance.  Hematologic/Lymphatic: Negative for adenopathy and bleeding problem. Does not bruise/bleed easily.  Musculoskeletal: Negative.   Gastrointestinal: complains of indigestion and reflux,Negative for nausea, vomiting, abdominal pain, diarrhea, constipation.   Neurological: Negative.  Allergic/Immunologic: Negative for environmental allergies.   PHYSICAL EXAM: Well-nournished, in no acute distress. Neck: No JVD, HJR, Bruit, or thyroid enlargement Lungs: No tachypnea, clear without wheezing, rales, or rhonchi Cardiovascular: irregular, PMI not displaced, heart sounds normal, no murmurs, gallops, bruit, thrill, or heave. Abdomen: BS normal. Soft without organomegaly, masses,  lesions or tenderness. Extremities: without cyanosis, clubbing or edema. Good distal pulses bilateral SKin: Warm, no lesions or rashes  Musculoskeletal: No deformities Neuro: no focal signs  BP 115/59  Pulse 77  Ht 5\' 9"  (1.753 m)  Wt 189 lb 1.9 oz (85.784 kg)  BMI 27.93 kg/m2

## 2011-05-12 NOTE — Assessment & Plan Note (Signed)
Patient is well compensated today with no evidence of heart failure.

## 2011-05-12 NOTE — Telephone Encounter (Signed)
Will address at f/u appt today

## 2011-05-13 ENCOUNTER — Encounter: Payer: Medicare Other | Admitting: *Deleted

## 2011-05-13 NOTE — Telephone Encounter (Signed)
GI discomfort is a known side effect of dabigatran. Medication with breakfast and dinner. Add Prilosec 20 mg per day if symptoms persist.  If they continue after adding Prilosec, increase the dose to twice a day.

## 2011-05-22 ENCOUNTER — Other Ambulatory Visit: Payer: Self-pay | Admitting: Cardiology

## 2011-06-09 ENCOUNTER — Telehealth: Payer: Self-pay | Admitting: *Deleted

## 2011-06-09 NOTE — Telephone Encounter (Signed)
Patient notified that we received samples of Pradaxa 75 mg #52 and are ready for pick up.

## 2011-06-11 ENCOUNTER — Encounter: Payer: Self-pay | Admitting: Cardiology

## 2011-06-11 ENCOUNTER — Telehealth: Payer: Self-pay | Admitting: *Deleted

## 2011-06-11 ENCOUNTER — Ambulatory Visit (HOSPITAL_COMMUNITY)
Admission: RE | Admit: 2011-06-11 | Discharge: 2011-06-11 | Disposition: A | Discharge status: Home / Self Care | Payer: BC Managed Care – PPO | Source: Ambulatory Visit | Attending: Cardiology | Admitting: Cardiology

## 2011-06-11 ENCOUNTER — Ambulatory Visit (INDEPENDENT_AMBULATORY_CARE_PROVIDER_SITE_OTHER): Payer: BC Managed Care – PPO | Admitting: Cardiology

## 2011-06-11 VITALS — BP 126/76 | HR 76 | Resp 16 | Ht 70.0 in | Wt 185.0 lb

## 2011-06-11 DIAGNOSIS — I4891 Unspecified atrial fibrillation: Secondary | ICD-10-CM

## 2011-06-11 DIAGNOSIS — I428 Other cardiomyopathies: Secondary | ICD-10-CM

## 2011-06-11 DIAGNOSIS — M1990 Unspecified osteoarthritis, unspecified site: Secondary | ICD-10-CM

## 2011-06-11 DIAGNOSIS — Z7901 Long term (current) use of anticoagulants: Secondary | ICD-10-CM

## 2011-06-11 DIAGNOSIS — I10 Essential (primary) hypertension: Secondary | ICD-10-CM

## 2011-06-11 DIAGNOSIS — I509 Heart failure, unspecified: Secondary | ICD-10-CM | POA: Insufficient documentation

## 2011-06-11 MED ORDER — DILTIAZEM HCL ER 180 MG PO CP24: 180.0000 mg | ORAL_CAPSULE | Freq: Every day | ORAL | Status: DC

## 2011-06-11 MED ORDER — WARFARIN SODIUM 5 MG PO TABS: ORAL_TABLET | ORAL | Status: DC

## 2011-06-11 MED ORDER — LOSARTAN POTASSIUM 100 MG PO TABS: 100.0000 mg | ORAL_TABLET | Freq: Every day | ORAL | Status: DC

## 2011-06-11 NOTE — Patient Instructions (Addendum)
Your physician recommends that you schedule a follow-up appointment in:  1 - 2 months with Dr Dietrich Pates 2 - Pacemaker appointment within the next 2 weeks 3 - Have INR done at [facility] (Accross the street from our office) as early as possible and Jule will call you with further instructions.  Your physician has recommended you make the following change in your medication:  1 - START Warfarin 5 mg M,W,F,Sat and 2.5 mg (1/2 tablet) Tue,Thurs,Sun +++++ Start 3 days before Pradaxa is stopped 2 - STOP Pradaxa after you have been on coumadin for 3 days 3 - STOP Lisinopril 4 - START Losartin (Cozaar) 100mg  daily 5 - START Diltiazem 180 mg daily  Your physician recommends that you return for lab work in: TODAY (CMET, CBC, BNP)  Stool Cards x 3 and return to office as soon as possible  A chest x-ray takes a picture of the organs and structures inside the chest, including the heart, lungs, and blood vessels. This test can show several things, including, whether the heart is enlarges; whether fluid is building up in the lungs; and whether pacemaker / defibrillator leads are still in place.

## 2011-06-11 NOTE — Assessment & Plan Note (Addendum)
Patient has atrial fibrillation with a rapid ventricular response at this visit, but is appparently asymptomatic.  Diltiazem 180 mg per day will be added to his medical regime with reassessment of atrial fibrillation by pacemaker interrogation in 2 weeks.

## 2011-06-11 NOTE — Progress Notes (Signed)
Patient ID: Mario Cline, male   DOB: [birth year], 78 y.o.   MRN: [id number] HPI: Scheduled return visit for this very pleasant octogenarian with cardiomyopathy and sick sinus syndrome.  At his most recent visit with Dr. Ladona Ridgel increased atrial fibrillation was detected by interrogation of patient's AICD, and dabigatran was added to his medical regime.  Unfortunately, the cost of that drug is proving prohibitive for him.  He also reports a recent annoying cough without any preceding upper respiratory infection.  He has chronic class III dyspnea on exertion, but is generally asymptomatic with his usual daily activities.  He describes symptoms that could represent orthopnea, but are not classic for this diagnosis.  Prior to Admission medications   Medication Sig Start Date End Date Taking? Authorizing Provider  acetaminophen (TYLENOL) 500 MG tablet Take 500 mg by mouth every 6 (six) hours as needed. For headache    Yes Historical Provider, MD  ALDACTONE 25 MG tablet TAKE ONE TABLET DAILY. 05/03/11  Yes Gerrit Friends. Flannery Cavallero, MD  COREG 25 MG tablet TAKE (1/2) TABLET BY MOUTH TWICE DAILY. 03/08/11  Yes Jacolyn Reedy, PA  LASIX 40 MG tablet TAKE ONE TABLET DAILY. 05/22/11  Yes [name], MD  Multiple Vitamins-Minerals (MULTIVITAMINS THER. W/MINERALS) TABS Take 1 tablet by mouth daily.     Yes Historical Provider, MD  diltiazem (DILACOR XR) 180 MG 24 hr capsule Take 1 capsule (180 mg total) by mouth daily. 06/11/11 06/10/12  Gerrit Friends. Ramia Sidney, MD  losartan (COZAAR) 100 MG tablet Take 1 tablet (100 mg total) by mouth daily. 06/11/11 06/10/12  Gerrit Friends. Phat Dalton, MD  warfarin (COUMADIN) 5 MG tablet Take as directed 06/11/11   [name], MD  No Known Allergies    Past medical history, social history, and family history reviewed and updated.  ROS: See history of present illness  PHYSICAL EXAM: BP 126/76  Pulse 76  Resp 16  Ht 5\' 10"  (1.778 m)  Wt 83.915 kg (185 lb)  BMI 26.54 kg/m2; weight decreased  4 pounds since his last visit. General-Well developed; no acute distress Body habitus-proportionate weight and height Neck-minor JVD; no carotid bruits Lungs-clear lung fields; resonant to percussion Cardiovascular-normal PMI; normal S1 and S2; irregular rapid rhythm Abdomen-normal bowel sounds; soft and non-tender without masses or organomegaly Musculoskeletal-No deformities, no cyanosis or clubbing Neurologic-Normal cranial nerves; symmetric strength and tone Skin-Warm, no significant lesions Extremities-distal pulses intact; no edema  ASSESSMENT AND PLAN:  [place] Bing, MD 06/11/2011 6:48 PM

## 2011-06-11 NOTE — Telephone Encounter (Signed)
Patients wife advised to stay on lasix 80 mg bid as ordered until return appt on 2/25 and script was sent in as requested on 1/22 to account for change in dose.

## 2011-06-11 NOTE — Assessment & Plan Note (Signed)
Patient appears fairly well compensated both by exam and history, but does have exertional and some resting dyspnea.  A BNP level and chest x-ray will be obtained to further evaluate CHF.  Cough may be ACE-induced.  Losartan will be substituted for lisinopril.

## 2011-06-11 NOTE — Assessment & Plan Note (Signed)
CBC and stool for Hemoccult testing will continue to be monitored to exclude occult GI blood loss

## 2011-06-12 LAB — COMPREHENSIVE METABOLIC PANEL
ALT: 11 U/L (ref 0–53)
AST: 20 U/L (ref 0–37)
Albumin: 4.8 g/dL (ref 3.5–5.2)
Alkaline Phosphatase: 72 U/L (ref 39–117)
BUN: 33 mg/dL — ABNORMAL HIGH (ref 6–23)
Chloride: 106 mEq/L (ref 96–112)
Potassium: 4.7 mEq/L (ref 3.5–5.3)
Sodium: 142 mEq/L (ref 135–145)
Total Protein: 7.1 g/dL (ref 6.0–8.3)

## 2011-06-12 LAB — CBC
MCHC: 34.1 g/dL (ref 30.0–36.0)
Platelets: 204 10*3/uL (ref 150–400)
RDW: 12.9 % (ref 11.5–15.5)
WBC: 8.5 10*3/uL (ref 4.0–10.5)

## 2011-06-14 ENCOUNTER — Encounter: Payer: Self-pay | Admitting: *Deleted

## 2011-06-16 ENCOUNTER — Other Ambulatory Visit: Payer: Self-pay

## 2011-06-16 ENCOUNTER — Encounter (INDEPENDENT_AMBULATORY_CARE_PROVIDER_SITE_OTHER): Payer: BC Managed Care – PPO | Admitting: *Deleted

## 2011-06-16 DIAGNOSIS — Z7901 Long term (current) use of anticoagulants: Secondary | ICD-10-CM

## 2011-06-25 ENCOUNTER — Encounter: Payer: Self-pay | Admitting: Cardiology

## 2011-06-25 ENCOUNTER — Encounter: Payer: BC Managed Care – PPO | Admitting: *Deleted

## 2011-07-22 ENCOUNTER — Ambulatory Visit (INDEPENDENT_AMBULATORY_CARE_PROVIDER_SITE_OTHER): Payer: Medicare Other | Admitting: *Deleted

## 2011-07-22 ENCOUNTER — Encounter: Payer: Self-pay | Admitting: Internal Medicine

## 2011-07-22 DIAGNOSIS — I428 Other cardiomyopathies: Secondary | ICD-10-CM

## 2011-07-22 DIAGNOSIS — I4891 Unspecified atrial fibrillation: Secondary | ICD-10-CM

## 2011-07-22 LAB — REMOTE ICD DEVICE
AL IMPEDENCE ICD: 330 Ohm
BRDY-0002RV: 60 {beats}/min
BRDY-0003RV: 100 {beats}/min
BRDY-0004RV: 110 {beats}/min
HV IMPEDENCE: 45 Ohm
RV LEAD IMPEDENCE ICD: 410 Ohm
TZAT-0001FASTVT: 1
TZAT-0004SLOWVT: 8
TZAT-0012FASTVT: 200 ms
TZAT-0012SLOWVT: 200 ms
TZAT-0013SLOWVT: 3
TZAT-0018FASTVT: NEGATIVE
TZAT-0018SLOWVT: NEGATIVE
TZAT-0019FASTVT: 7.5 V
TZAT-0019SLOWVT: 7.5 V
TZAT-0020FASTVT: 1 ms
TZON-0003FASTVT: 300 ms
TZON-0003SLOWVT: 330 ms
TZON-0004FASTVT: 24
TZON-0004SLOWVT: 35
TZON-0005FASTVT: 6
TZON-0010FASTVT: 80 ms
TZON-0010SLOWVT: 80 ms
TZST-0001FASTVT: 4
TZST-0001SLOWVT: 3
TZST-0001SLOWVT: 5
TZST-0003FASTVT: 15 J
TZST-0003FASTVT: 36 J
TZST-0003SLOWVT: 25 J
TZST-0003SLOWVT: 36 J

## 2011-07-26 ENCOUNTER — Ambulatory Visit: Payer: Medicare Other | Admitting: Cardiology

## 2011-08-04 ENCOUNTER — Encounter: Payer: Self-pay | Admitting: *Deleted

## 2011-08-05 NOTE — Progress Notes (Signed)
Remote icd check  

## 2011-08-09 ENCOUNTER — Ambulatory Visit (INDEPENDENT_AMBULATORY_CARE_PROVIDER_SITE_OTHER): Payer: Medicare Other | Admitting: Cardiology

## 2011-08-09 ENCOUNTER — Encounter: Payer: Self-pay | Admitting: Cardiology

## 2011-08-09 ENCOUNTER — Encounter: Payer: Self-pay | Admitting: *Deleted

## 2011-08-09 DIAGNOSIS — I10 Essential (primary) hypertension: Secondary | ICD-10-CM

## 2011-08-09 DIAGNOSIS — Z7901 Long term (current) use of anticoagulants: Secondary | ICD-10-CM

## 2011-08-09 DIAGNOSIS — I951 Orthostatic hypotension: Secondary | ICD-10-CM

## 2011-08-09 DIAGNOSIS — I4891 Unspecified atrial fibrillation: Secondary | ICD-10-CM

## 2011-08-09 DIAGNOSIS — I428 Other cardiomyopathies: Secondary | ICD-10-CM

## 2011-08-09 NOTE — Assessment & Plan Note (Signed)
No recent symptoms suggesting orthostatic hypotension.  No significant change in blood pressure assessed in the office today based upon body orientation.

## 2011-08-09 NOTE — Progress Notes (Signed)
Patient ID: Mario Cline, male   DOB: [birth year], 78 y.o.   MRN: [id number]  HPI: Scheduled return visit for this very large gentleman with cardiomyopathy and sick sinus syndrome.  Since his last visit, he has done exceedingly well.  Cough, whose onset he now reports was in the setting of a upper respiratory infection, has virtually resolved.  He maintains good exercise tolerance with class II dyspnea on exertion.  He has had no chest discomfort, palpitations nor syncope.  He sleeps on 2 pillows and sometimes feels mildly dyspneic when he is lying on the couch, but does not have definite orthopnea nor PND.  Prior to Admission medications   Medication Sig Start Date End Date Taking? Authorizing Provider  acetaminophen (TYLENOL) 500 MG tablet Take 500 mg by mouth every 6 (six) hours as needed. For headache    Yes Historical Provider, MD  ALDACTONE 25 MG tablet TAKE ONE TABLET DAILY. 05/03/11  Yes Kathlen Brunswick, MD  COREG 25 MG tablet TAKE (1/2) TABLET BY MOUTH TWICE DAILY. 03/08/11  Yes Dyann Kief, PA  diltiazem (DILACOR XR) 180 MG 24 hr capsule Take 1 capsule (180 mg total) by mouth daily. 06/11/11 06/10/12 Yes Kathlen Brunswick, MD  LASIX 40 MG tablet TAKE ONE TABLET DAILY. 05/22/11  Yes Kathlen Brunswick, MD  losartan (COZAAR) 100 MG tablet Take 1 tablet (100 mg total) by mouth daily. 06/11/11 06/10/12 Yes Kathlen Brunswick, MD  Multiple Vitamins-Minerals (MULTIVITAMINS THER. W/MINERALS) TABS Take 1 tablet by mouth daily.     Yes Historical Provider, MD  warfarin (COUMADIN) 5 MG tablet Take as directed 06/11/11  Yes Kathlen Brunswick, MD  No Known Allergies    Past medical history, social history, and family history reviewed and updated.  ROS: See history of present illness.  All other systems reviewed and are negative.  PHYSICAL EXAM: BP 98/66  Pulse 72  Resp 16  Ht 5\' 10"  (1.778 m)  Wt 85.276 kg (188 lb)  BMI 26.98 kg/m2   General-Well developed; no acute distress Body habitus-Mildly  overweight Neck-No JVD; no carotid bruits Lungs-clear lung fields; resonant to percussion Cardiovascular-normal PMI; normal S1 and S2; rhythm irregularity; no 3rd nor 4th heart sound appreciated. Abdomen-Mildly distended; normal bowel sounds; soft and non-tender without masses or organomegaly Musculoskeletal-No deformities, no cyanosis or clubbing Neurologic-Normal cranial nerves; symmetric strength and tone Skin-Warm, no significant lesions Extremities-distal pulses intact; Trace edema; Surface varicose veins, particularly on the right  ASSESSMENT AND PLAN:  Andrew Bing, MD 08/09/2011 1:29 PM

## 2011-08-09 NOTE — Assessment & Plan Note (Signed)
No symptoms referable to atrial fibrillation.  Efforts to maintain anticoagulation continue.

## 2011-08-09 NOTE — Patient Instructions (Addendum)
Your physician recommends that you schedule a follow-up appointment in: 7 months  Your physician recommends that you return for lab work in: 4 months

## 2011-08-09 NOTE — Assessment & Plan Note (Signed)
Blood pressure control is good; current medication will be continued. 

## 2011-08-09 NOTE — Assessment & Plan Note (Signed)
Dr. Ouida Sills is managing anticoagulation with warfarin with some difficulty in achieving stable and therapeutic anticoagulation.

## 2011-08-09 NOTE — Assessment & Plan Note (Signed)
Symptomatically, patient has done well in recent months.  He walks the woods and fishes without significant symptoms.  Current medications will be continued.

## 2011-08-27 ENCOUNTER — Telehealth: Payer: Self-pay | Admitting: Cardiology

## 2011-08-27 NOTE — Telephone Encounter (Signed)
Returned a call from [name] regarding her husband. She reports that he has had a dry cough over the last week or so and noticed him wheezing some today. The wheezing isn't really bothering him, but she wanted to see if anything needed to be done. He denies shortness of breath, edema, worsening orthopnea (usually sleeps with 2 pillows), chest pain, palpitations, fevers, chills or sputum production. He feels well overall. I instructed them to see if taking his daily lasix would help and if he feels worse or develops any of the symptoms above then he could go to their urgent care for evaluation and if that is not open to go to the ER. She said she tried to call his PCP but was unable to reach him. She said they may just try to get an appointment on Monday if it continues or worsens.   Mario Word PA-C 08/27/2011 7:47 PM

## 2011-08-28 ENCOUNTER — Other Ambulatory Visit: Payer: Self-pay | Admitting: Physician Assistant

## 2011-08-30 ENCOUNTER — Telehealth: Payer: Self-pay | Admitting: *Deleted

## 2011-08-30 NOTE — Telephone Encounter (Signed)
Message copied by Gaynelle Adu on Mon Aug 30, 2011  9:42 AM ------      Message from: Kathlen Brunswick      Created: Sun Aug 29, 2011  2:58 PM       Please give Mr. Rehm a call on Monday and determine how he is doing.  Have him check his weight if possible.  Asked him about orthopnea, increased dyspnea on exertion and edema.            Thanks,       Rob

## 2011-08-30 NOTE — Telephone Encounter (Signed)
Spoke with wife, who states he has had increased cough and fever over the weekend.  Has an appointment with Dr Ouida Sills this am and will follow up with our office if Dr Ouida Sills deems it to be necessary.

## 2011-09-24 ENCOUNTER — Other Ambulatory Visit: Payer: Self-pay | Admitting: Cardiology

## 2011-09-24 ENCOUNTER — Encounter: Payer: Self-pay | Admitting: Cardiology

## 2011-09-24 ENCOUNTER — Ambulatory Visit (INDEPENDENT_AMBULATORY_CARE_PROVIDER_SITE_OTHER): Payer: Medicare Other | Admitting: Cardiology

## 2011-09-24 VITALS — BP 90/52 | HR 69 | Resp 16 | Ht 70.0 in | Wt 193.0 lb

## 2011-09-24 DIAGNOSIS — I428 Other cardiomyopathies: Secondary | ICD-10-CM

## 2011-09-24 DIAGNOSIS — I4891 Unspecified atrial fibrillation: Secondary | ICD-10-CM

## 2011-09-24 DIAGNOSIS — I951 Orthostatic hypotension: Secondary | ICD-10-CM

## 2011-09-24 DIAGNOSIS — I10 Essential (primary) hypertension: Secondary | ICD-10-CM

## 2011-09-24 MED ORDER — LOSARTAN POTASSIUM 100 MG PO TABS: 100.0000 mg | ORAL_TABLET | Freq: Every day | ORAL | Status: AC

## 2011-09-24 NOTE — Progress Notes (Signed)
Patient ID: Mario Cline, male   DOB: 10/08/1928, 78 y.o.   MRN: [id number]  HPI: Scheduled return visit for this nice octogenarian with cardiomyopathy and sick sinus syndrome.  Patient was doing well when last seen 6 weeks ago, but subsequently has developed orthostatic dizziness.  He has a remote history of hypertension, but has also been troubled by orthostatic hypotension.  There was no frank loss of consciousness, but his knees buckled on one occasion.  Treatment with warfarin continues with stable and therapeutic INRs.  He denies signs of GI blood loss.  Blood pressure log for home measurements reviewed.  On multiple occasions, blood pressure was less than 90 systolic with some values in the 60s.  Patient's family is also concerned about increasing abdominal girth.  Prior to Admission medications   Medication Sig Start Date End Date Taking? Authorizing Provider  acetaminophen (TYLENOL) 500 MG tablet Take 500 mg by mouth every 6 (six) hours as needed. For headache    Yes Historical Provider, MD  ALDACTONE 25 MG tablet TAKE ONE TABLET DAILY. 05/03/11  Yes Kathlen Brunswick, MD  COREG 25 MG tablet TAKE (1/2) TABLET BY MOUTH TWICE DAILY. 08/28/11  Yes Kathlen Brunswick, MD  LASIX 40 MG tablet TAKE ONE TABLET DAILY. 05/22/11  Yes Kathlen Brunswick, MD  losartan (COZAAR) 100 MG tablet Take 1 tablet (100 mg total) by mouth daily. 09/24/11 09/23/12 Yes Kathlen Brunswick, MD  Multiple Vitamins-Minerals (MULTIVITAMINS THER. W/MINERALS) TABS Take 1 tablet by mouth daily.     Yes Historical Provider, MD  warfarin (COUMADIN) 5 MG tablet Take as directed 06/11/11  Yes Kathlen Brunswick, MD   No Known Allergies    Past medical history, social history, and family history reviewed and updated.  ROS: Denies orthopnea, PND, chest discomfort, or palpitations.  No melena, nausea, emesis, hematemesis or hematochezia.  All other systems reviewed and are negative.  PHYSICAL EXAM: BP 90/52  Pulse 69  Resp 16  Ht 5\' 10"   (1.778 m)  Wt 87.544 kg (193 lb)  BMI 27.69 kg/m2  Significant orthostatic change in blood pressure. General-Well developed; no acute distress Body habitus-overweight Neck-No JVD; no carotid bruits Lungs-clear lung fields; resonant to percussion Cardiovascular-normal PMI; normal S1 and S2; regular rhythm Abdomen-normal bowel sounds; soft and non-tender without masses or organomegaly Musculoskeletal-No deformities, no cyanosis or clubbing Neurologic-Normal cranial nerves; symmetric strength and tone Skin-Warm, no significant lesions Extremities-distal pulses intact; 1/2-1+ pedal and ankle edema  EKG: Ventricular demand pacing; underlying atrial fibrillation.  ASSESSMENT AND PLAN:  [place] Bing, MD 09/24/2011 5:04 PM

## 2011-09-24 NOTE — Assessment & Plan Note (Signed)
Atrial fibrillation persists, but there is no native conduction with current therapy.  Since diltiazem is provided the least benefit from the standpoint of his cardiomyopathy and likely causing the most lowering of blood pressure, and may be impairing LV systolic function is well, we will attempt to discontinue the drug entirely.  If heart rate increases, we will either increase his dose of carvedilol or add digoxin.

## 2011-09-24 NOTE — Assessment & Plan Note (Addendum)
Orthostatic and supine hypotension have recurred with symptoms, but fortunately without loss of consciousness.  Initially, diltiazem will be discontinued.  If tachycardia occurs or hypotension persists, further adjustments in his medical regime will be instituted.  He will return for a blood pressure check in a week and see me again in 6 months as previously planned.

## 2011-09-24 NOTE — Assessment & Plan Note (Addendum)
Assessment of LV systolic function have been variable.  An additional determination at this point would not provide any particular benefit.  We will continue to use the highest tolerated doses of appropriate medications.  Increase in abdominal girth could represent ascites, possibly as a result of congestive heart failure, but I doubt that it does in the absence of neck vein distention.  CT Scan in 2009 showed no ascites and no apparent hepatic disease.  Cholelithiasis was noted.

## 2011-09-24 NOTE — Progress Notes (Deleted)
Name: Mario Cline    DOB: 09-Jan-1929  Age: 78 y.o.  MR#: [id number]       PCP:  Carylon Perches, MD, MD      Insurance: @PAYORNAME @   CC:    Chief Complaint  Patient presents with  . Appointment    hypotension, dizziness +meds    VS BP 90/52  Pulse 69  Resp 16  Ht 5\' 10"  (1.778 m)  Wt 193 lb (87.544 kg)  BMI 27.69 kg/m2  Weights Current Weight  09/24/11 193 lb (87.544 kg)  08/09/11 188 lb (85.276 kg)  06/11/11 185 lb (83.915 kg)    Blood Pressure  BP Readings from Last 3 Encounters:  09/24/11 90/52  08/09/11 98/66  06/11/11 126/76     Admit date:  (Not on file) Last encounter with RMR:  08/09/2011   Allergy No Known Allergies  Current Outpatient Prescriptions  Medication Sig Dispense Refill  . acetaminophen (TYLENOL) 500 MG tablet Take 500 mg by mouth every 6 (six) hours as needed. For headache       . ALDACTONE 25 MG tablet TAKE ONE TABLET DAILY.  90 each  0  . COREG 25 MG tablet TAKE (1/2) TABLET BY MOUTH TWICE DAILY.  30 each  6  . diltiazem (DILACOR XR) 180 MG 24 hr capsule Take 1 capsule (180 mg total) by mouth daily.  90 capsule  3  . LASIX 40 MG tablet TAKE ONE TABLET DAILY.  30 each  6  . losartan (COZAAR) 100 MG tablet Take 1 tablet (100 mg total) by mouth daily.  90 tablet  3  . Multiple Vitamins-Minerals (MULTIVITAMINS THER. W/MINERALS) TABS Take 1 tablet by mouth daily.        Marland Kitchen warfarin (COUMADIN) 5 MG tablet Take as directed  30 tablet  3    Discontinued Meds:   There are no discontinued medications.  Patient Active Problem List  Diagnoses  . HYPERTENSION, MILD  . CARDIOMYOPATHY, DILATED  . DEGENERATIVE JOINT DISEASE  . Sleep apnea  . DYSPNEA ON EXERTION  . ICD (implantable cardiac defibrillator) in place  . Orthostatic hypotension  . Atrial fibrillation  . Chronic anticoagulation    LABS Clinical Support on 07/22/2011  Component Date Value  . DEVICE MODEL ICD 07/22/2011 784696   . DEV-0014ICD 07/22/2011 [name]   M.D.   . Nira Conn  07/22/2011 N   . [id number] 07/22/2011 [name]   M.D.   . Sherlon Handing 07/22/2011 [name]   M.D.   . EVAL-0005E8 07/22/2011 [employer] Monitoring System   . EVAL-0022E8 07/22/2011                     Value:ICD remote received. Sensing, impedances, auto capture thresholds consistent with previous device measurements. Histograms appropriate for patient and level of activity. A-fib, + pradaxa.   No ventricular arrhythmias recorded. All other diagnostic data                          reviewed and is appropriate and stable for patient. Real time EGM demonstrates appropriate sensing and capture. Plan to check remotely in 3 months, see in office annually.  ROV in June with Dr. Ladona Ridgel in [place].  Marland Kitchen AL IMPEDENCE ICD 07/22/2011 330   . RV LEAD IMPEDENCE ICD 07/22/2011 410   . HV IMPEDENCE 07/22/2011 45   . BATTERY VOLTAGE 07/22/2011 2.65   . VENTRICULAR PACING ICD 07/22/2011 1   . ATRIAL PACING  ICD 07/22/2011 50   . RV LEAD AMPLITUDE 07/22/2011 11.7   . BRDY-0001RV 07/22/2011 DDD   . BRDY-0002RV 07/22/2011 60   . BRDY-0003RV 07/22/2011 100   . BRDY-0004RV 07/22/2011 110   . BRDY-0005RV 07/22/2011 Off   . BRDY-0007RV 07/22/2011 Atrial Pace   . BRDY-0008RV 07/22/2011 Off   . BRDY-0009RV 07/22/2011 No   . BRDY-0010RV 07/22/2011 V.Safety=On   . BAMS-0001 07/22/2011 150   . BAMS-0003 07/22/2011 70   . TZON-0002FASTVT 07/22/2011 ATP + 4 Shocks   . TZON-0003FASTVT 07/22/2011 300   . TZON-0004FASTVT 07/22/2011 24   . TZON-0005FASTVT 07/22/2011 6   . TZON-0008FASTVT 07/22/2011 Off   . TZON-0010FASTVT 07/22/2011 80   . TZAT-0001FASTVT 07/22/2011 1   . TZAT-0002FASTVT 07/22/2011 Y   . TZAT-0003FASTVT 07/22/2011 Burst   . TZAT-0004FASTVT 07/22/2011 8   . TZAT-0012FASTVT 07/22/2011 200   . TZAT-0013FASTVT 07/22/2011 1   . TZAT-0018FASTVT 07/22/2011 N   . TZAT-0019FASTVT 07/22/2011 7.5   . TZAT-0020FASTVT 07/22/2011 1.0   . TZST-0001FASTVT 07/22/2011 2   . TZST-0002FASTVT 07/22/2011 Y    . TZST-0003FASTVT 07/22/2011 15.0   . TZST-0004FASTVT 07/22/2011 Biphasic   . TZST-0007FASTVT 07/22/2011 B > AX   . TZST-0001FASTVT 07/22/2011 3   . TZST-0002FASTVT 07/22/2011 Y   . TZST-0003FASTVT 07/22/2011 25.0   . TZST-0007FASTVT 07/22/2011 B > AX   . TZST-0001FASTVT 07/22/2011 4   . TZST-0002FASTVT 07/22/2011 Y   . TZST-0003FASTVT 07/22/2011 36.0   . TZST-0004FASTVT 07/22/2011 Biphasic   . TZST-0007FASTVT 07/22/2011 B > AX   . TZST-0001FASTVT 07/22/2011 5   . TZST-0002FASTVT 07/22/2011 Y   . TZST-0003FASTVT 07/22/2011 36.0   . TZST-0004FASTVT 07/22/2011 Biphasic   . TZST-0007FASTVT 07/22/2011 B > AX   . TZON-0002SLOWVT 07/22/2011 ATP + 4 Shocks   . TZON-0003SLOWVT 07/22/2011 330   . TZON-0004SLOWVT 07/22/2011 35   . TZON-0005SLOWVT 07/22/2011 6   . TZON-0008SLOWVT 07/22/2011 Off   . TZON-0010SLOWVT 07/22/2011 80   . TZAT-0001SLOWVT 07/22/2011 1   . TZAT-0002SLOWVT 07/22/2011 Y   . TZAT-0003SLOWVT 07/22/2011 Burst   . TZAT-0004SLOWVT 07/22/2011 8   . TZAT-0012SLOWVT 07/22/2011 200   . TZAT-0013SLOWVT 07/22/2011 3   . TZAT-0018SLOWVT 07/22/2011 N   . TZAT-0019SLOWVT 07/22/2011 7.5   . TZAT-0020SLOWVT 07/22/2011 1.0   . TZST-0001SLOWVT 07/22/2011 2   . TZST-0002SLOWVT 07/22/2011 Y   . TZST-0003SLOWVT 07/22/2011 15.0   . TZST-0004SLOWVT 07/22/2011 Biphasic   . TZST-0007SLOWVT 07/22/2011 B > AX   . TZST-0001SLOWVT 07/22/2011 3   . TZST-0002SLOWVT 07/22/2011 Y   . TZST-0003SLOWVT 07/22/2011 25.0   . TZST-0004SLOWVT 07/22/2011 Biphasic   . TZST-0007SLOWVT 07/22/2011 B > AX   . TZST-0001SLOWVT 07/22/2011 4   . TZST-0002SLOWVT 07/22/2011 Y   . TZST-0003SLOWVT 07/22/2011 36.0   . TZST-0004SLOWVT 07/22/2011 Biphasic   . TZST-0007SLOWVT 07/22/2011 B > AX   . TZST-0001SLOWVT 07/22/2011 5   . TZST-0002SLOWVT 07/22/2011 Y   . TZST-0004SLOWVT 07/22/2011 Biphasic   . TZST-0007SLOWVT 07/22/2011 B > AX      Results for this Opt Visit:     Results for orders placed in visit  on 07/22/11  REMOTE ICD DEVICE      Component Value Range   DEVICE MODEL ICD 045409     DEV-0014ICD [name]   M.D.     DEV-0020ICD N     DEV-0014LDO [name]   M.D.     [id number] [name]   M.D.     EVAL-0005E8 [employer] Monitoring System  [id number]       Value: ICD remote received. Sensing, impedances, auto capture thresholds consistent with previous device measurements. Histograms appropriate for patient and level of activity. A-fib, + pradaxa.   No ventricular arrhythmias recorded. All other diagnostic data      reviewed and is appropriate and stable for patient. Real time EGM demonstrates appropriate sensing and capture. Plan to check remotely in 3 months, see in office annually.  ROV in June with Dr. Ladona Ridgel in [place].   AL IMPEDENCE ICD 330     RV LEAD IMPEDENCE ICD 410     HV IMPEDENCE 45     BATTERY VOLTAGE 2.65     VENTRICULAR PACING ICD 1     ATRIAL PACING ICD 50     RV LEAD AMPLITUDE 11.7     BRDY-0001RV DDD     BRDY-0002RV 60     BRDY-0003RV 100     BRDY-0004RV 110     BRDY-0005RV Off     BRDY-0007RV Atrial Pace     BRDY-0008RV Off     BRDY-0009RV No     BRDY-0010RV V.Safety=On     BAMS-0001 150     BAMS-0003 70     TZON-0002FASTVT ATP + 4 Shocks     TZON-0003FASTVT 300     TZON-0004FASTVT 24     TZON-0005FASTVT 6     TZON-0008FASTVT Off     TZON-0010FASTVT 80     TZAT-0001FASTVT 1     TZAT-0002FASTVT Y     TZAT-0003FASTVT Burst     TZAT-0004FASTVT 8     TZAT-0012FASTVT 200     TZAT-0013FASTVT 1     TZAT-0018FASTVT N     TZAT-0019FASTVT 7.5     TZAT-0020FASTVT 1.0     TZST-0001FASTVT 2     TZST-0002FASTVT Y     TZST-0003FASTVT 15.0     TZST-0004FASTVT Biphasic     TZST-0007FASTVT B > AX     TZST-0001FASTVT 3     TZST-0002FASTVT Y     TZST-0003FASTVT 25.0     TZST-0007FASTVT B > AX     TZST-0001FASTVT 4     TZST-0002FASTVT Y     TZST-0003FASTVT 36.0     TZST-0004FASTVT Biphasic     TZST-0007FASTVT B > AX      TZST-0001FASTVT 5     TZST-0002FASTVT Y     TZST-0003FASTVT 36.0     TZST-0004FASTVT Biphasic     TZST-0007FASTVT B > AX     TZON-0002SLOWVT ATP + 4 Shocks     TZON-0003SLOWVT 330     TZON-0004SLOWVT 35     TZON-0005SLOWVT 6     TZON-0008SLOWVT Off     TZON-0010SLOWVT 80     TZAT-0001SLOWVT 1     TZAT-0002SLOWVT Y     TZAT-0003SLOWVT Burst     TZAT-0004SLOWVT 8     TZAT-0012SLOWVT 200     TZAT-0013SLOWVT 3     TZAT-0018SLOWVT N     TZAT-0019SLOWVT 7.5     TZAT-0020SLOWVT 1.0     TZST-0001SLOWVT 2     TZST-0002SLOWVT Y     TZST-0003SLOWVT 15.0     TZST-0004SLOWVT Biphasic     TZST-0007SLOWVT B > AX     TZST-0001SLOWVT 3     TZST-0002SLOWVT Y     TZST-0003SLOWVT 25.0     TZST-0004SLOWVT Biphasic     TZST-0007SLOWVT B > AX     TZST-0001SLOWVT 4     TZST-0002SLOWVT Y     TZST-0003SLOWVT 36.0     TZST-0004SLOWVT Biphasic  TZST-0007SLOWVT B > AX     TZST-0001SLOWVT 5     TZST-0002SLOWVT Y     TZST-0004SLOWVT Biphasic     TZST-0007SLOWVT B > AX      EKG Orders placed in visit on 04/19/11  . EKG 12-LEAD     Prior Assessment and Plan Problem List as of 09/24/2011          Cardiology Problems   HYPERTENSION, MILD   Last Assessment & Plan Note   08/09/2011 Office Visit Signed 08/09/2011  2:37 PM by Kathlen Brunswick, MD    Blood pressure control is good; current medication will be continued.    CARDIOMYOPATHY, DILATED   Last Assessment & Plan Note   08/09/2011 Office Visit Signed 08/09/2011  2:36 PM by Kathlen Brunswick, MD    Symptomatically, patient has done well in recent months.  He walks the woods and fishes without significant symptoms.  Current medications will be continued.    Orthostatic hypotension   Last Assessment & Plan Note   08/09/2011 Office Visit Signed 08/09/2011  2:38 PM by Kathlen Brunswick, MD    No recent symptoms suggesting orthostatic hypotension.  No significant change in blood pressure assessed in the office today based upon body orientation.     Atrial fibrillation   Last Assessment & Plan Note   08/09/2011 Office Visit Signed 08/09/2011  2:34 PM by Kathlen Brunswick, MD    No symptoms referable to atrial fibrillation.  Efforts to maintain anticoagulation continue.      Other   DEGENERATIVE JOINT DISEASE   Sleep apnea   Last Assessment & Plan Note   12/08/2010 Office Visit Signed 12/08/2010  1:44 PM by Waymon Budge, MD    Good compliance and control. No changes are indicated for now. He is encouraged to lose weight.    DYSPNEA ON EXERTION   Last Assessment & Plan Note   12/08/2010 Office Visit Signed 12/08/2010  1:43 PM by Waymon Budge, MD    Related to cardiac status and deconditioning. PFT and 6 MWT were normal    ICD (implantable cardiac defibrillator) in place   Last Assessment & Plan Note   04/19/2011 Office Visit Signed 04/19/2011 11:22 AM by Marinus Maw, MD    Normal device function. Will follow.    Chronic anticoagulation   Last Assessment & Plan Note   08/09/2011 Office Visit Signed 08/09/2011  2:36 PM by Kathlen Brunswick, MD    Dr. Ouida Sills is managing anticoagulation with warfarin with some difficulty in achieving stable and therapeutic anticoagulation.        Imaging: No results found.   FRS Calculation: Score not calculated

## 2011-09-24 NOTE — Patient Instructions (Addendum)
Your physician recommends that you schedule a follow-up appointment in:  1 - 1 month follow up 2 - Blood pressure check in 1 week  Your physician recommends that you return for lab work in: Within the week  Your physician has requested that you regularly monitor and record your blood pressure readings at home. Please use the same machine at the same time of day to check your readings and record them to bring to your follow-up visit.  Your physician has recommended you make the following change in your medication:  1 - STOP Diltiazem (Cardizem)

## 2011-09-25 LAB — COMPREHENSIVE METABOLIC PANEL
ALT: 12 U/L (ref 0–53)
AST: 18 U/L (ref 0–37)
Albumin: 4.1 g/dL (ref 3.5–5.2)
BUN: 49 mg/dL — ABNORMAL HIGH (ref 6–23)
Calcium: 9.1 mg/dL (ref 8.4–10.5)
Chloride: 111 mEq/L (ref 96–112)
Potassium: 4.9 mEq/L (ref 3.5–5.3)
Sodium: 143 mEq/L (ref 135–145)
Total Protein: 6.6 g/dL (ref 6.0–8.3)

## 2011-09-25 LAB — CBC
HCT: 40.3 % (ref 39.0–52.0)
Platelets: 220 10*3/uL (ref 150–400)
RDW: 13.8 % (ref 11.5–15.5)
WBC: 7.3 10*3/uL (ref 4.0–10.5)

## 2011-09-28 ENCOUNTER — Ambulatory Visit (HOSPITAL_COMMUNITY)
Admission: RE | Admit: 2011-09-28 | Discharge: 2011-09-28 | Disposition: A | Discharge status: Home / Self Care | Payer: Medicare Other | Source: Ambulatory Visit | Attending: Cardiology | Admitting: Cardiology

## 2011-09-28 DIAGNOSIS — R42 Dizziness and giddiness: Secondary | ICD-10-CM | POA: Insufficient documentation

## 2011-09-28 DIAGNOSIS — I428 Other cardiomyopathies: Secondary | ICD-10-CM | POA: Insufficient documentation

## 2011-09-28 DIAGNOSIS — I517 Cardiomegaly: Secondary | ICD-10-CM

## 2011-09-28 DIAGNOSIS — I10 Essential (primary) hypertension: Secondary | ICD-10-CM | POA: Insufficient documentation

## 2011-09-28 NOTE — Progress Notes (Signed)
*  PRELIMINARY RESULTS* Echocardiogram 2D Echocardiogram has been performed.  Mario Cline 09/28/2011, 12:08 PM

## 2011-09-30 ENCOUNTER — Ambulatory Visit: Payer: Medicare Other | Admitting: Cardiology

## 2011-10-02 ENCOUNTER — Encounter: Payer: Self-pay | Admitting: Cardiology

## 2011-10-02 DIAGNOSIS — I1310 Hypertensive heart and chronic kidney disease without heart failure, with stage 1 through stage 4 chronic kidney disease, or unspecified chronic kidney disease: Secondary | ICD-10-CM | POA: Insufficient documentation

## 2011-10-05 ENCOUNTER — Ambulatory Visit (INDEPENDENT_AMBULATORY_CARE_PROVIDER_SITE_OTHER): Payer: Medicare Other

## 2011-10-05 ENCOUNTER — Encounter: Payer: Self-pay | Admitting: Cardiology

## 2011-10-05 ENCOUNTER — Other Ambulatory Visit: Payer: Self-pay | Admitting: *Deleted

## 2011-10-05 VITALS — BP 89/64 | HR 98 | Ht 68.0 in | Wt 190.0 lb

## 2011-10-05 DIAGNOSIS — R7989 Other specified abnormal findings of blood chemistry: Secondary | ICD-10-CM

## 2011-10-05 DIAGNOSIS — I10 Essential (primary) hypertension: Secondary | ICD-10-CM

## 2011-10-05 MED ORDER — FUROSEMIDE 40 MG PO TABS: ORAL_TABLET | ORAL | Status: DC

## 2011-10-05 NOTE — Progress Notes (Signed)
**Note De-Identified [place] Basham Obfuscation** S: Pt. Arrives in office for a 1 week BP check with nurse. B: On last OV with Dr. Dietrich Pates on 09-24-11 pt. was advised to stop taking Diltiazem due to hypotension. A: Pt. c/o fatigue and weakness. His BP this morning is 89/64 with a HR of 98 and on last OV his BP was 90/52 with a HR of 69. He brought his BP diary to this visit and out of 11 recordings 8 systolic readings and 8 diastolic readings are higher than today's reading and 3 systolic and 3 diastolic readings are lower than today's reading. Also, orthostatic BP's obtained and recorded in pt's chart. R: Pt. Is advised to continue current medical treatment and that we will contact him with Dr. Marvel Plan recommendations./LV

## 2011-10-05 NOTE — Patient Instructions (Signed)
Decrease furosemide to 40 mg Monday, Wednesday and Friday, and 20 mg other days BMet in 10 days.

## 2011-10-07 ENCOUNTER — Other Ambulatory Visit: Payer: Self-pay | Admitting: *Deleted

## 2011-10-08 ENCOUNTER — Encounter: Payer: Self-pay | Admitting: Cardiology

## 2011-10-12 ENCOUNTER — Encounter: Payer: Medicare Other | Admitting: Internal Medicine

## 2011-10-13 ENCOUNTER — Encounter: Payer: Self-pay | Admitting: Cardiology

## 2011-10-13 NOTE — Progress Notes (Signed)
Patient ID: Mario Cline, male   DOB: [birth year], 78 y.o.   MRN: [id number]  Blood pressure remains low and symptoms persist.  10 mm orthostatic change in systolic blood pressure is of marginal significance.  Decrease carvedilol to 12.5 mg twice a day.

## 2011-10-14 ENCOUNTER — Other Ambulatory Visit: Payer: Self-pay | Admitting: *Deleted

## 2011-10-14 MED ORDER — CARVEDILOL 6.25 MG PO TABS: 6.2500 mg | ORAL_TABLET | Freq: Two times a day (BID) | ORAL | Status: DC

## 2011-10-14 NOTE — Progress Notes (Signed)
Recommendations given to patients wife.  Verified that patient was already taking 12.5 mg twice a day in the form of 1/2 of a 25 mg tab twice daily, therefore script called in for 6.25 bid.

## 2011-10-15 LAB — BASIC METABOLIC PANEL
BUN: 32 mg/dL — ABNORMAL HIGH (ref 6–23)
Calcium: 8.7 mg/dL (ref 8.4–10.5)
Creat: 1.22 mg/dL (ref 0.50–1.35)
Glucose, Bld: 110 mg/dL — ABNORMAL HIGH (ref 70–99)

## 2011-10-18 ENCOUNTER — Other Ambulatory Visit: Payer: Self-pay | Admitting: Cardiology

## 2011-10-20 ENCOUNTER — Telehealth: Payer: Self-pay | Admitting: *Deleted

## 2011-10-20 NOTE — Telephone Encounter (Signed)
Message copied by Kyung Rudd on Wed Oct 20, 2011 10:36 AM ------      Message from: Kathlen Brunswick      Created: Sun Oct 17, 2011  7:27 PM      Regarding: RE: level of anticoagulation       That would be great. Please notify the individual in Dr. Alonza Smoker office who manages warfarin.            Thanks, Rob      ----- Message -----         From: Louanna Raw, RN         Sent: 10/14/2011  10:06 AM           To: Kathlen Brunswick, MD      Subject: RE: level of anticoagulation                             We do not manage this pt's coumadin.  Looks like Dr Ouida Sills does.  Would like for me to notify them?      ----- Message -----         From: Kathlen Brunswick, MD         Sent: 10/14/2011   9:01 AM           To: Louanna Raw, RN      Subject: level of anticoagulation                                 Misty Stanley,            Mr. Basnett suffered a CVA approximately one year ago when INR was slightly less than 2.  Let's try to maintain INR 2.5-3.5 in response to this event.            Thanks,      Rob

## 2011-10-22 ENCOUNTER — Encounter: Payer: Self-pay | Admitting: Cardiology

## 2011-10-25 ENCOUNTER — Encounter: Payer: Self-pay | Admitting: Cardiology

## 2011-10-25 ENCOUNTER — Encounter: Payer: Self-pay | Admitting: *Deleted

## 2011-10-25 ENCOUNTER — Ambulatory Visit (INDEPENDENT_AMBULATORY_CARE_PROVIDER_SITE_OTHER): Payer: Medicare Other | Admitting: Cardiology

## 2011-10-25 VITALS — BP 101/69 | HR 89 | Resp 18 | Ht 68.0 in | Wt 190.0 lb

## 2011-10-25 DIAGNOSIS — Z95810 Presence of automatic (implantable) cardiac defibrillator: Secondary | ICD-10-CM

## 2011-10-25 DIAGNOSIS — Z7901 Long term (current) use of anticoagulants: Secondary | ICD-10-CM

## 2011-10-25 DIAGNOSIS — I428 Other cardiomyopathies: Secondary | ICD-10-CM

## 2011-10-25 DIAGNOSIS — I429 Cardiomyopathy, unspecified: Secondary | ICD-10-CM

## 2011-10-25 DIAGNOSIS — R0989 Other specified symptoms and signs involving the circulatory and respiratory systems: Secondary | ICD-10-CM

## 2011-10-25 DIAGNOSIS — R0609 Other forms of dyspnea: Secondary | ICD-10-CM

## 2011-10-25 DIAGNOSIS — I951 Orthostatic hypotension: Secondary | ICD-10-CM

## 2011-10-25 DIAGNOSIS — I4891 Unspecified atrial fibrillation: Secondary | ICD-10-CM

## 2011-10-25 MED ORDER — CARVEDILOL 12.5 MG PO TABS: 12.5000 mg | ORAL_TABLET | Freq: Two times a day (BID) | ORAL | Status: DC

## 2011-10-25 NOTE — Patient Instructions (Addendum)
Your physician recommends that you schedule a follow-up appointment in:  1 - 2 months with Dr Dietrich Pates 2 - Dr Ladona Ridgel next available for discussion of upgrade of device  Your physician recommends that you return for lab work in: 6 weeks (you will receive a reminder letter)  Your physician has recommended you make the following change in your medication:  1 - INCREASE Coreg (carvedilol) to 12.5 mg twice daily

## 2011-10-25 NOTE — Assessment & Plan Note (Signed)
No significant change in blood pressure when standing at today's visit.

## 2011-10-25 NOTE — Progress Notes (Deleted)
Name: Mario Cline    DOB: 1928/09/30  Age: 78 y.o.  MR#: [id number]       PCP:  Carylon Perches, MD      Insurance: @PAYORNAME @   CC:    Chief Complaint  Patient presents with  . Appointment    fatigue, hypotension, circulation in feet +meds    VS BP 82/66  Pulse 82  Resp 18  Ht 5\' 8"  (1.727 m)  Wt 190 lb (86.183 kg)  BMI 28.89 kg/m2  Weights Current Weight  10/25/11 190 lb (86.183 kg)  10/05/11 190 lb (86.183 kg)  09/24/11 193 lb (87.544 kg)    Blood Pressure  BP Readings from Last 3 Encounters:  10/25/11 82/66  10/05/11 89/64  09/24/11 90/52     Admit date:  (Not on file) Last encounter with RMR:  10/22/2011   Allergy No Known Allergies  Current Outpatient Prescriptions  Medication Sig Dispense Refill  . acetaminophen (TYLENOL) 500 MG tablet Take 500 mg by mouth every 6 (six) hours as needed. For headache       . ALDACTONE 25 MG tablet TAKE ONE TABLET DAILY.  90 each  0  . carvedilol (COREG) 6.25 MG tablet Take 1 tablet (6.25 mg total) by mouth 2 (two) times daily with a meal.  60 tablet  12  . COUMADIN 5 MG tablet TAKE AS DIRECTED.  30 each  2  . furosemide (LASIX) 40 MG tablet 40 mg once daily M.W.F and 20 mg daily all other days  30 tablet  12  . losartan (COZAAR) 100 MG tablet Take 1 tablet (100 mg total) by mouth daily.  90 tablet  3  . Multiple Vitamins-Minerals (MULTIVITAMINS THER. W/MINERALS) TABS Take 1 tablet by mouth daily.        Marland Kitchen DISCONTD: furosemide (LASIX) 40 MG tablet Take 40 mg daily on Mondays, Wednesdays and Fridays 20 mg daily all other days  30 tablet  12    Discontinued Meds:    Medications Discontinued During This Encounter  Medication Reason  . furosemide (LASIX) 40 MG tablet Error    Patient Active Problem List  Diagnosis  . HYPERTENSION, MILD  . CARDIOMYOPATHY, DILATED  . DEGENERATIVE JOINT DISEASE  . Sleep apnea  . DYSPNEA ON EXERTION  . Biventricular implantable cardiac defibrillator in situ  . Orthostatic hypotension  . Atrial  fibrillation  . Chronic anticoagulation  . Cardiorenal syndrome    LABS Orders Only on 10/07/2011  Component Date Value  . Sodium 10/15/2011 143   . Potassium 10/15/2011 4.7   . Chloride 10/15/2011 110   . CO2 10/15/2011 24   . Glucose, Bld 10/15/2011 110*  . BUN 10/15/2011 32*  . Creat 10/15/2011 1.22   . Calcium 10/15/2011 8.7   Orders Only on 09/24/2011  Component Date Value  . WBC 09/24/2011 7.3   . RBC 09/24/2011 3.97*  . Hemoglobin 09/24/2011 13.2   . HCT 09/24/2011 40.3   . MCV 09/24/2011 101.5*  . [facility] 09/24/2011 33.2   . MCHC 09/24/2011 32.8   . RDW 09/24/2011 13.8   . Platelets 09/24/2011 220   . Sodium 09/24/2011 143   . Potassium 09/24/2011 4.9   . Chloride 09/24/2011 111   . CO2 09/24/2011 23   . Glucose, Bld 09/24/2011 79   . BUN 09/24/2011 49*  . Creat 09/24/2011 1.86*  . Total Bilirubin 09/24/2011 0.5   . Alkaline Phosphatase 09/24/2011 57   . AST 09/24/2011 18   . ALT 09/24/2011 12   .  Total Protein 09/24/2011 6.6   . Albumin 09/24/2011 4.1   . Calcium 09/24/2011 9.1   . TSH 09/24/2011 1.291   . Brain Natriuretic Peptide 09/24/2011 310.6*     Results for this Opt Visit:     Results for orders placed in visit on 10/07/11  BASIC METABOLIC PANEL      Component Value Range   Sodium 143  135 - 145 mEq/L   Potassium 4.7  3.5 - 5.3 mEq/L   Chloride 110  96 - 112 mEq/L   CO2 24  19 - 32 mEq/L   Glucose, Bld 110 (*) 70 - 99 mg/dL   BUN 32 (*) 6 - 23 mg/dL   Creat 4.09  8.11 - 9.14 mg/dL   Calcium 8.7  8.4 - 78.2 mg/dL    EKG Orders placed in visit on 09/24/11  . EKG 12-LEAD     Prior Assessment and Plan Problem List as of 10/25/2011            Cardiology Problems   HYPERTENSION, MILD   Last Assessment & Plan Note   08/09/2011 Office Visit Signed 08/09/2011  2:37 PM by Kathlen Brunswick, MD    Blood pressure control is good; current medication will be continued.    CARDIOMYOPATHY, DILATED   Last Assessment & Plan Note   09/24/2011 Office  Visit Addendum 09/24/2011  5:15 PM by Kathlen Brunswick, MD    Assessment of LV systolic function have been variable.  An additional determination at this point would not provide any particular benefit.  We will continue to use the highest tolerated doses of appropriate medications.  Increase in abdominal girth could represent ascites, possibly as a result of congestive heart failure, but I doubt that it does in the absence of neck vein distention.  CT Scan in 2009 showed no ascites and no apparent hepatic disease.  Cholelithiasis was noted.    Orthostatic hypotension   Last Assessment & Plan Note   09/24/2011 Office Visit Addendum 09/26/2011 10:43 PM by Kathlen Brunswick, MD    Orthostatic and supine hypotension have recurred with symptoms, but fortunately without loss of consciousness.  Initially, diltiazem will be discontinued.  If tachycardia occurs or hypotension persists, further adjustments in his medical regime will be instituted.  He will return for a blood pressure check in a week and see me again in 6 months as previously planned.    Atrial fibrillation   Last Assessment & Plan Note   09/24/2011 Office Visit Signed 09/24/2011  5:10 PM by Kathlen Brunswick, MD    Atrial fibrillation persists, but there is no native conduction with current therapy.  Since diltiazem is provided the least benefit from the standpoint of his cardiomyopathy and likely causing the most lowering of blood pressure, and may be impairing LV systolic function is well, we will attempt to discontinue the drug entirely.  If heart rate increases, we will either increase his dose of carvedilol or add digoxin.    Cardiorenal syndrome     Other   DEGENERATIVE JOINT DISEASE   Sleep apnea   Last Assessment & Plan Note   12/08/2010 Office Visit Signed 12/08/2010  1:44 PM by Waymon Budge, MD    Good compliance and control. No changes are indicated for now. He is encouraged to lose weight.    DYSPNEA ON EXERTION   Last  Assessment & Plan Note   12/08/2010 Office Visit Signed 12/08/2010  1:43 PM by Waymon Budge, MD  Related to cardiac status and deconditioning. PFT and 6 MWT were normal    Biventricular implantable cardiac defibrillator in situ   Last Assessment & Plan Note   04/19/2011 Office Visit Signed 04/19/2011 11:22 AM by Marinus Maw, MD    Normal device function. Will follow.    Chronic anticoagulation   Last Assessment & Plan Note   08/09/2011 Office Visit Signed 08/09/2011  2:36 PM by Kathlen Brunswick, MD    Dr. Ouida Sills is managing anticoagulation with warfarin with some difficulty in achieving stable and therapeutic anticoagulation.        Imaging: No results found.   FRS Calculation: Score not calculated

## 2011-10-25 NOTE — Progress Notes (Signed)
Patient ID: Mario Cline, male   DOB: [birth year], 78 y.o.   MRN: [id number]  HPI: Scheduled return visit for this very nice gentleman with long-standing cardiomyopathy who recently has developed decreased exercise tolerance, and increased dyspnea on exertion and orthostatic lightheadedness with a significant orthostatic change in blood pressure.  Since adjustment of his medical regime, lightheadedness has virtually resolved; however, dyspnea on mild exertion persists.  Blood pressure determinations at home have been generally good, but on occasion systolics decrease into the 80s and rarely into the 70s.    Prior to Admission medications   Medication Sig Start Date End Date Taking? Authorizing Provider  acetaminophen (TYLENOL) 500 MG tablet Take 500 mg by mouth every 6 (six) hours as needed. For headache    Yes Historical Provider, MD  ALDACTONE 25 MG tablet TAKE ONE TABLET DAILY. 05/03/11  Yes Kathlen Brunswick, MD  carvedilol (COREG) 6.25 MG tablet Take 1 tablet (6.25 mg total) by mouth 2 (two) times daily with a meal. 10/14/11  Yes Kathlen Brunswick, MD  COUMADIN 5 MG tablet TAKE AS DIRECTED. 10/18/11  Yes Jodelle Gross, NP  furosemide (LASIX) 40 MG tablet 40 mg once daily M.W.F and 20 mg daily all other days 10/05/11  Yes Kathlen Brunswick, MD  losartan (COZAAR) 100 MG tablet Take 1 tablet (100 mg total) by mouth daily. 09/24/11 09/23/12 Yes Kathlen Brunswick, MD  Multiple Vitamins-Minerals (MULTIVITAMINS THER. W/MINERALS) TABS Take 1 tablet by mouth daily.     Yes Historical Provider, MD    No Known Allergies    Past medical history, social history, and family history reviewed and updated.  ROS: There has been no orthopnea, PND, weight increase or increase in mild chronic pedal edema.  All other systems reviewed and are negative.  PHYSICAL EXAM: BP 82/66  Pulse 82  Resp 18  Ht 5\' 8"  (1.727 m)  Wt 86.183 kg (190 lb)  BMI 28.89 kg/m2  General-Well developed; no acute distress Body  habitus-Mildly overweight Neck-No JVD; no carotid bruits Lungs-clear lung fields; resonant to percussion; mild kyphosis Cardiovascular-normal PMI; normal S1 and S2; S4 present-no S3 Abdomen-normal bowel sounds; soft and non-tender without masses or organomegaly Musculoskeletal-No deformities, no cyanosis or clubbing Neurologic-Normal cranial nerves; symmetric strength and tone Skin-Warm, no significant lesions Extremities-distal pulses intact, but decreased on the left where there is rubor of the forefoot; temperature slightly cool but equal bilaterally; 1/2-1+ ankle edema  EKG:  Prior tracings reviewed; patient has been 100% ventricularly paced with underlying atrial fibrillation.   ASSESSMENT AND PLAN:  Oradell Bing, MD 10/25/2011 11:45 AM

## 2011-10-25 NOTE — Assessment & Plan Note (Signed)
There is not much evidence for congestion at present.  BNP was only 330 recently.  Chest x-ray showed no signs of CHF.  He has no jugular venous distention, mild peripheral edema, and weight has increased only a few pounds.  For now, and carvedilol dosage will be increased.  Consideration will be given to increasing his dose of diuretic, but this has resulted in progressive renal insufficiency in the past.

## 2011-10-25 NOTE — Assessment & Plan Note (Addendum)
Patient will be scheduled to see Dr. Ladona Ridgel for consideration of biventricular pacing.

## 2011-10-25 NOTE — Assessment & Plan Note (Addendum)
Atrial fibrillation now appears to be constant.  This may have resulted in some deterioration in cardiac performance, but I doubt this is a major factor in his deterioration.  We can consider cardioversion in an attempt to maintain sinus rhythm if other measures fail to significantly improve his exercise tolerance and quality of life.

## 2011-10-26 NOTE — Assessment & Plan Note (Signed)
6 minute walk test in 10/2011:600 feet, normal oxygen saturation, slight increase in heart rate to a maximum in the 90s.

## 2011-11-09 ENCOUNTER — Ambulatory Visit (INDEPENDENT_AMBULATORY_CARE_PROVIDER_SITE_OTHER): Payer: Medicare Other | Admitting: Internal Medicine

## 2011-11-09 ENCOUNTER — Encounter: Payer: Self-pay | Admitting: Internal Medicine

## 2011-11-09 VITALS — BP 98/61 | HR 73 | Ht 68.0 in | Wt 191.0 lb

## 2011-11-09 DIAGNOSIS — I4891 Unspecified atrial fibrillation: Secondary | ICD-10-CM

## 2011-11-09 DIAGNOSIS — I428 Other cardiomyopathies: Secondary | ICD-10-CM

## 2011-11-09 DIAGNOSIS — Z95810 Presence of automatic (implantable) cardiac defibrillator: Secondary | ICD-10-CM

## 2011-11-09 LAB — ICD DEVICE OBSERVATION
ATRIAL PACING ICD: 0 pct
BAMS-0001: 150 {beats}/min
BATTERY VOLTAGE: 2.6019 V
BRDY-0003RV: 100 {beats}/min
DEVICE MODEL ICD: 499949
FVT: 0
HV IMPEDENCE: 45 Ohm
PACEART VT: 0
RV LEAD AMPLITUDE: 9 mv
RV LEAD IMPEDENCE ICD: 375 Ohm
TOT-0008: 0
TOT-0009: 1
TOT-0010: 26
TZAT-0001FASTVT: 1
TZAT-0004SLOWVT: 8
TZAT-0018SLOWVT: NEGATIVE
TZAT-0019FASTVT: 7.5 V
TZAT-0019SLOWVT: 7.5 V
TZAT-0020FASTVT: 1 ms
TZON-0003SLOWVT: 330 ms
TZON-0004FASTVT: 24
TZON-0004SLOWVT: 35
TZON-0005FASTVT: 6
TZON-0005SLOWVT: 6
TZON-0010FASTVT: 80 ms
TZON-0010SLOWVT: 80 ms
TZST-0001FASTVT: 4
TZST-0001SLOWVT: 2
TZST-0001SLOWVT: 3
TZST-0001SLOWVT: 4
TZST-0001SLOWVT: 5
TZST-0003FASTVT: 15 J
TZST-0003FASTVT: 36 J
TZST-0003SLOWVT: 25 J
VF: 0

## 2011-11-09 NOTE — Patient Instructions (Addendum)
Your physician recommends that you schedule a follow-up appointment in: 1 year follow up  

## 2011-11-09 NOTE — Assessment & Plan Note (Signed)
His device is working normally. Will recheck in several months. 

## 2011-11-09 NOTE — Progress Notes (Signed)
HPI Mario Cline returns today for followup. He is a pleasant 78 yo man with Non-ischemic CM, CHF, COPD and HTN. He is s/p ICD implant. No recent ICD shocks or syncope. He has class 2 symptoms. He denies chest pain or sob. No Known Allergies   Current Outpatient Prescriptions  Medication Sig Dispense Refill  . acetaminophen (TYLENOL) 500 MG tablet Take 500 mg by mouth every 6 (six) hours as needed. For headache       . ALDACTONE 25 MG tablet TAKE ONE TABLET DAILY.  90 each  0  . carvedilol (COREG) 12.5 MG tablet Take 1 tablet (12.5 mg total) by mouth 2 (two) times daily with a meal.  60 tablet  12  . COUMADIN 5 MG tablet TAKE AS DIRECTED.  30 each  2  . furosemide (LASIX) 40 MG tablet 40 mg once daily M.W.F and 20 mg daily all other days  30 tablet  12  . losartan (COZAAR) 100 MG tablet Take 1 tablet (100 mg total) by mouth daily.  90 tablet  3  . Multiple Vitamins-Minerals (MULTIVITAMINS THER. W/MINERALS) TABS Take 1 tablet by mouth daily.           Past Medical History  Diagnosis Date  . Nonischemic cardiomyopathy     Cath in 1997-nonobstructive disease with normal EF; h/o CHF; EF-45% in '03, 25% in '08, 25-30% in '11;   . Hypertension   . Orthostatic hypotension   . Obstructive sleep apnea 2002    CPAP prescribed  . DJD (degenerative joint disease)   . AICD (automatic cardioverter/defibrillator) present 06/2007    AICD/dual-chamber pacemaker - (St. Jude)- 2/09;   . Atrial fibrillation 04/2009    Rare brief episodes by ICD interrogation until 03/2011->persistent  . Chronic anticoagulation     ROS:   All systems reviewed and negative except as noted in the HPI.   Past Surgical History  Procedure Date  . Tonsillectomy   . Pacemaker insertion   . Cholecystectomy   . Posterior laminectomy / decompression lumbar spine 1985    +fusion; Dr. Hope Pigeon  . Cardiac defibrillator placement 06/2007     Family History  Problem Relation Age of Onset  . Heart failure Mother   . Heart  attack Father   . Cancer Brother     x2     History   Social History  . Marital Status: Married    Spouse Name: N/A    Number of Children: 2  . Years of Education: N/A   Occupational History  . Retired     Garment/textile technologist   Social History Main Topics  . Smoking status: Never Smoker   . Smokeless tobacco: Never Used  . Alcohol Use: 0.5 oz/week    1 drink(s) per week  . Drug Use: No  . Sexually Active: No   Other Topics Concern  . Not on file   Social History Narrative  . No narrative on file     BP 98/61  Pulse 73  Ht 5\' 8"  (1.727 m)  Wt 191 lb (86.637 kg)  BMI 29.04 kg/m2  Physical Exam:  Well appearing elderly man, NAD HEENT: Unremarkable Neck:  No JVD, no thyromegally Lungs:  Clear with no wheezes. Well healed ICD incision. HEART:  Regular rate rhythm, no murmurs, no rubs, no clicks Abd:  soft, positive bowel sounds, no organomegally, no rebound, no guarding Ext:  2 plus pulses, no edema, no cyanosis, no clubbing Skin:  No rashes no nodules  Neuro:  CN II through XII intact, motor grossly intact  DEVICE  Normal device function.  See PaceArt for details.   Assess/Plan:

## 2011-11-09 NOTE — Assessment & Plan Note (Addendum)
He is in atrial fib and has been for several months. His rate is well controlled. WE discussed the possible treatment options. I have discussed admit for tikosyn but for now will hold off on this.

## 2011-12-06 ENCOUNTER — Other Ambulatory Visit: Payer: Self-pay | Admitting: Cardiology

## 2011-12-06 LAB — BASIC METABOLIC PANEL
BUN: 31 mg/dL — ABNORMAL HIGH (ref 6–23)
CO2: 26 mEq/L (ref 19–32)
Chloride: 110 mEq/L (ref 96–112)
Potassium: 4.5 mEq/L (ref 3.5–5.3)

## 2011-12-07 ENCOUNTER — Encounter: Payer: Self-pay | Admitting: Internal Medicine

## 2011-12-07 ENCOUNTER — Ambulatory Visit (INDEPENDENT_AMBULATORY_CARE_PROVIDER_SITE_OTHER): Payer: Medicare Other | Admitting: Internal Medicine

## 2011-12-07 VITALS — BP 98/60 | HR 69 | Ht 68.0 in | Wt 194.2 lb

## 2011-12-07 DIAGNOSIS — G4733 Obstructive sleep apnea (adult) (pediatric): Secondary | ICD-10-CM

## 2011-12-07 NOTE — Progress Notes (Signed)
  Subjective:    Patient ID: Mario Cline, male    DOB: 20-Nov-1928, 78 y.o.   MRN: [id number]  HPI 12/08/10- 82 yoM followed for OSA, complicated by rhinitis, CM, AFib, chronic CHF.     Wife here Last here June 05, 2010- for review of normal PFT and CPAP 11 Advanced- used all night every night and wife confirms he doesn't snore through it.  Bothersome watery rhinorhea resolved on its own so he never tried ipratropium   12/07/11- 83 yoM followed for OSA, complicated by rhinitis, CM, AFib, chronic CHF.         Wife here Wears for CPAP 11/ [facility] every night for approx 8 hours and doing well. Cardiology manages his cardiomyopathy-AICD/ pacer.  ROS-see HPI Constitutional:   No-   weight loss, night sweats, fevers, chills, fatigue, lassitude. HEENT:   No-  headaches, difficulty swallowing, tooth/dental problems, sore throat,       No-  sneezing, itching, ear ache, nasal congestion, post nasal drip,  CV:  No-   chest pain, orthopnea, PND, swelling in lower extremities, anasarca, dizziness, palpitations Resp: No-   shortness of breath with exertion or at rest.              No-   productive cough,  No non-productive cough,  No- coughing up of blood.              No-   change in color of mucus.  No- wheezing.   Skin: No-   rash or lesions. GI:  No-   heartburn, indigestion, abdominal pain, nausea, vomiting,  GU:  MS:  No-   joint pain or swelling.   Neuro-     nothing unusual Psych:  No- change in mood or affect. No depression or anxiety.  No memory loss.  Objective:   Physical Exam  General- Alert, Oriented, Affect-appropriate, Distress- none acute   obese Skin- rash-none, lesions- none, excoriation- none Lymphadenopathy- none Head- atraumatic            Eyes- Gross vision intact, PERRLA, conjunctivae clear secretions            Ears- Hearing, canals            Nose- Clear, No-Septal dev, mucus, polyps, erosion, perforation             Throat- Mallampati IV , mucosa  clear , drainage- none, tonsils- atrophic Neck- flexible , trachea midline, no stridor , thyroid nl, carotid no bruit Chest - symmetrical excursion , unlabored           Heart/CV- RRR , no murmur , no gallop  , no rub, nl s1 s2                           + JVD 1 cm , edema1+, stasis changes- none, varices- none           Lung- clear to P&A, wheeze- none, cough- none , dullness-none, rub- none           Chest wall-  Abd-  Br/ Gen/ Rectal- Not done, not indicated Extrem- cyanosis- none, clubbing, none, atrophy- none, strength- nl. Superficial varices. Neuro- grossly intact to observation        Assessment & Plan:   No problem-specific assessment & plan notes found for this encounter.

## 2011-12-07 NOTE — Patient Instructions (Addendum)
We don't need to change your CPAP for now. I will double check on your pressure setting with [facility]. Please call as needed

## 2011-12-08 ENCOUNTER — Telehealth: Payer: Self-pay | Admitting: Cardiology

## 2011-12-08 NOTE — Telephone Encounter (Signed)
Patient went to [facility] over the weekend and did not mind his low sodium diet.  Has only gained 1 pound, however has pedal edema.  Denies SOB.  Is currently taking lasix 40 M,W,F and 20 all other days.  Lab work was normal, therefore he will take extra 1/2 tab today and tomorrow, continue weights and call with > 5 pound gain and elevate legs, resuming his previous prescribed dose on Friday.

## 2011-12-08 NOTE — Telephone Encounter (Signed)
Mild or even moderate pedal edema is acceptable as long as he does not gain a significant amount of weight.  Continue strict low-salt diet.  Maintain leg elevation as much as possible.

## 2011-12-08 NOTE — Telephone Encounter (Signed)
Patient's wife states that his dosage of Lasix was changed and now his feet and ankles are swelling. / tg

## 2011-12-08 NOTE — Telephone Encounter (Signed)
Message left for a return call

## 2011-12-13 NOTE — Assessment & Plan Note (Signed)
Good compliance and control, confirmed by his wife today

## 2011-12-27 ENCOUNTER — Encounter: Payer: Self-pay | Admitting: Cardiology

## 2011-12-27 ENCOUNTER — Ambulatory Visit (INDEPENDENT_AMBULATORY_CARE_PROVIDER_SITE_OTHER): Payer: Medicare Other | Admitting: Cardiology

## 2011-12-27 VITALS — BP 98/65 | HR 89 | Ht 68.0 in | Wt 186.0 lb

## 2011-12-27 DIAGNOSIS — I951 Orthostatic hypotension: Secondary | ICD-10-CM

## 2011-12-27 DIAGNOSIS — I1310 Hypertensive heart and chronic kidney disease without heart failure, with stage 1 through stage 4 chronic kidney disease, or unspecified chronic kidney disease: Secondary | ICD-10-CM

## 2011-12-27 DIAGNOSIS — Z7901 Long term (current) use of anticoagulants: Secondary | ICD-10-CM

## 2011-12-27 DIAGNOSIS — I428 Other cardiomyopathies: Secondary | ICD-10-CM

## 2011-12-27 DIAGNOSIS — G4733 Obstructive sleep apnea (adult) (pediatric): Secondary | ICD-10-CM

## 2011-12-27 DIAGNOSIS — I10 Essential (primary) hypertension: Secondary | ICD-10-CM

## 2011-12-27 DIAGNOSIS — N189 Chronic kidney disease, unspecified: Secondary | ICD-10-CM

## 2011-12-27 DIAGNOSIS — E782 Mixed hyperlipidemia: Secondary | ICD-10-CM

## 2011-12-27 DIAGNOSIS — I4891 Unspecified atrial fibrillation: Secondary | ICD-10-CM

## 2011-12-27 MED ORDER — PRAVASTATIN SODIUM 10 MG PO TABS: 10.0000 mg | ORAL_TABLET | Freq: Every day | ORAL | Status: DC

## 2011-12-27 MED ORDER — PRAVASTATIN SODIUM 20 MG PO TABS: 20.0000 mg | ORAL_TABLET | Freq: Every day | ORAL | Status: DC

## 2011-12-27 NOTE — Assessment & Plan Note (Signed)
Managed and recently evaluated by Dr. Maple Hudson, who felt that current therapy was effective and appropriate.

## 2011-12-27 NOTE — Assessment & Plan Note (Signed)
Stable with only minimal elevation in creatinine.

## 2011-12-27 NOTE — Progress Notes (Signed)
Patient ID: Mario Cline, male   DOB: Jan 17, 1929, 78 y.o.   MRN: [id number]  HPI: Scheduled return visit for this very nice gentleman with nonischemic cardiomyopathy.  Since his last visit, he has done quite well.  He has class III dyspnea on exertion, but does do some yard work without too much difficulty.  He has not experienced lightheadedness nor has he suffered any falls or loss of consciousness.  He sleeps on 2 pillows, but does not have clear orthopnea.  He has chronic pedal edema with duskiness of the feet when dependent.  Weight has decreased with a reduced caloric intake managed by patient's wife.  Prior to Admission medications   Medication Sig Start Date End Date Taking? Authorizing Provider  acetaminophen (TYLENOL) 500 MG tablet Take 500 mg by mouth every 6 (six) hours as needed. For headache    Yes Historical Provider, MD  ALDACTONE 25 MG tablet TAKE ONE TABLET DAILY. 05/03/11  Yes Kathlen Brunswick, MD  carvedilol (COREG) 12.5 MG tablet Take 1 tablet (12.5 mg total) by mouth 2 (two) times daily with a meal. 10/25/11  Yes Kathlen Brunswick, MD  COUMADIN 5 MG tablet TAKE AS DIRECTED. 10/18/11  Yes Jodelle Gross, NP  furosemide (LASIX) 40 MG tablet 40 mg once daily M.W.F and 20 mg daily all other days 10/05/11  Yes Kathlen Brunswick, MD  losartan (COZAAR) 100 MG tablet Take 1 tablet (100 mg total) by mouth daily. 09/24/11 09/23/12 Yes Kathlen Brunswick, MD  Multiple Vitamins-Minerals (MULTIVITAMINS THER. W/MINERALS) TABS Take 1 tablet by mouth daily.     Yes Historical Provider, MD   No Known Allergies    Past medical history, social history, and family history reviewed and updated.  ROS: Denies PND, palpitations, nausea or emesis.  He has paresthesias and decreased sensation in the lower extremities.  All other systems reviewed and are negative.  PHYSICAL EXAM: BP 98/65  Pulse 89  Ht 5\' 8"  (1.727 m)  Wt 84.369 kg (186 lb)  BMI 28.28 kg/m2  General-Well developed; no acute  distress Body habitus-Mildly overweight Neck-No JVD; no carotid bruits Lungs-clear lung fields; resonant to percussion Cardiovascular-normal PMI; normal S1 and S2; irregular rhythm; no third heart sound appreciated. Abdomen-normal bowel sounds; soft and non-tender without masses or organomegaly Musculoskeletal-No deformities, no cyanosis or clubbing Neurologic-Normal cranial nerves; symmetric strength and tone Skin-Warm, no significant lesions Extremities-distal pulses intact; 1+ pedal and pretibial edema; dependent rubor  ASSESSMENT AND PLAN:  Doon Bing, MD 12/27/2011 2:41 PM

## 2011-12-27 NOTE — Assessment & Plan Note (Signed)
Patient appears well compensated at present.  Recent BNP level was only minimally elevated to 300.  Recent review by Dr. Ladona Ridgel showed that rhythm was paced approximately 33% of the time.  This was not thought to be an indication for biventricular pacing.  Current medication doses appear to be about the highest that blood pressure will tolerate.

## 2011-12-27 NOTE — Patient Instructions (Addendum)
Your physician recommends that you schedule a follow-up appointment in: 3 months  Low salt diet - see attached  Your physician recommends that you return for lab work in: 6 weeks - you will receive a reminder letter

## 2011-12-27 NOTE — Assessment & Plan Note (Signed)
Hypertension has resolved in the setting of treatment of cardiomyopathy.

## 2011-12-27 NOTE — Assessment & Plan Note (Signed)
Maintenance of sinus rhythm might improve patient's functional status, but substantial benefit is unlikely.  Current level of function is acceptable to patient.  We will defer more aggressive treatment of atrial arrhythmias pending worsening of patient's symptomatic status.

## 2011-12-27 NOTE — Assessment & Plan Note (Signed)
Dizziness has resolved despite systolics that are occasionally at near hypotensive levels.  Unless patient develops more impressive symptoms, cardiac drug doses will be maintained at their present level.

## 2011-12-27 NOTE — Progress Notes (Deleted)
Name: Mario Cline    DOB: 06/24/1928  Age: 78 y.o.  MR#: [id number]       PCP:  Carylon Perches, MD      Insurance: @PAYORNAME @   CC:    Chief Complaint  Patient presents with  . Cardiomyopathy    No complaints.  Med bottles/TC    VS BP 98/65  Pulse 89  Ht 5\' 8"  (1.727 m)  Wt 186 lb (84.369 kg)  BMI 28.28 kg/m2  Weights Current Weight  12/27/11 186 lb (84.369 kg)  12/07/11 194 lb 3.2 oz (88.089 kg)  11/09/11 191 lb (86.637 kg)    Blood Pressure  BP Readings from Last 3 Encounters:  12/27/11 98/65  12/07/11 98/60  11/09/11 98/61     Admit date:  (Not on file) Last encounter with RMR:  12/08/2011   Allergy No Known Allergies  Current Outpatient Prescriptions  Medication Sig Dispense Refill  . acetaminophen (TYLENOL) 500 MG tablet Take 500 mg by mouth every 6 (six) hours as needed. For headache       . ALDACTONE 25 MG tablet TAKE ONE TABLET DAILY.  90 each  0  . carvedilol (COREG) 12.5 MG tablet Take 1 tablet (12.5 mg total) by mouth 2 (two) times daily with a meal.  60 tablet  12  . COUMADIN 5 MG tablet TAKE AS DIRECTED.  30 each  2  . furosemide (LASIX) 40 MG tablet 40 mg once daily M.W.F and 20 mg daily all other days  30 tablet  12  . losartan (COZAAR) 100 MG tablet Take 1 tablet (100 mg total) by mouth daily.  90 tablet  3  . Multiple Vitamins-Minerals (MULTIVITAMINS THER. W/MINERALS) TABS Take 1 tablet by mouth daily.          Discontinued Meds:    Medications Discontinued During This Encounter  Medication Reason  . pravastatin (PRAVACHOL) 20 MG tablet   . pravastatin (PRAVACHOL) 10 MG tablet   . pravastatin (PRAVACHOL) 20 MG tablet     Patient Active Problem List  Diagnosis  . HYPERTENSION, MILD  . CARDIOMYOPATHY, DILATED  . DEGENERATIVE JOINT DISEASE  . Obstructive sleep apnea  . DYSPNEA ON EXERTION  . ICD (implantable cardiac defibrillator) in place  . Orthostatic hypotension  . Atrial fibrillation  . Chronic anticoagulation  . Cardiorenal syndrome     LABS Orders Only on 12/06/2011  Component Date Value  . Sodium 12/06/2011 143   . Potassium 12/06/2011 4.5   . Chloride 12/06/2011 110   . CO2 12/06/2011 26   . Glucose, Bld 12/06/2011 105*  . BUN 12/06/2011 31*  . Creat 12/06/2011 1.41*  . Calcium 12/06/2011 9.3   Office Visit on 11/09/2011  Component Date Value  . DEVICE MODEL ICD 11/09/2011 147829   . DEV-[id number] 11/09/2011 [name]   M.D.   . Nira Conn 11/09/2011 N   . [id number] 11/09/2011 [name]   M.D.   . Sherlon Handing 11/09/2011 [name]   M.D.   . PACEART TECH NOTES ICD 11/09/2011                     Value:ICD check in clinic. Normal device function. Thresholds and sensing consistent with previous device measurements. Impedance trends stable over time. No evidence of any ventricular arrhythmias. 100% mode switched, refuses coumadin. Histogram distribution                          appropriate for patient and  level of activity. No changes made this session. Device programmed at appropriate safety margins. Device programmed to optimize intrinsic conduction. Pt enrolled in remote follow-up. Plan to check device every 3 months                          remotely and in office annually. Patient education completed including shock plan. Alert tones/vibration demonstrated for patient.  Merlin 02/14/12  . AL IMPEDENCE ICD 11/09/2011 300.0   . RV LEAD IMPEDENCE ICD 11/09/2011 375.0   . HV IMPEDENCE 11/09/2011 45   . BATTERY VOLTAGE 11/09/2011 2.60192   . VF 11/09/2011 0   . FVT 11/09/2011 0   . PACEART VT 11/09/2011 0   . MODE SWITCH EPISODES 11/09/2011 2.0   . VENTRICULAR PACING ICD 11/09/2011 33.0   . ATRIAL PACING ICD 11/09/2011 0.0   . TOT-0006 11/09/2011 [id number]   . TOT-0007 11/09/2011 1   . TOT-0008 11/09/2011 0   . TOT-0009 11/09/2011 1   . TOT-0010 11/09/2011 26   . AL AMPLITUDE 11/09/2011 1.3   . RV LEAD AMPLITUDE 11/09/2011 9.0   . RV LEAD THRESHOLD 11/09/2011 0.75   . BRDY-0001RV  11/09/2011 DDD   . BRDY-0002RV 11/09/2011 60   . BRDY-0003RV 11/09/2011 100   . BRDY-0004RV 11/09/2011 110   . BRDY-0005RV 11/09/2011 Off   . BRDY-0007RV 11/09/2011 Atrial Pace   . BRDY-0008RV 11/09/2011 Off   . BRDY-0009RV 11/09/2011 No   . BRDY-0010RV 11/09/2011 V.Safety=On   . BAMS-0001 11/09/2011 150   . BAMS-0003 11/09/2011 70   . TZON-0002FASTVT 11/09/2011 ATP + 4 Shocks   . TZON-0003FASTVT 11/09/2011 300   . TZON-0004FASTVT 11/09/2011 24   . TZON-0005FASTVT 11/09/2011 6   . TZON-0008FASTVT 11/09/2011 Off   . TZON-0010FASTVT 11/09/2011 80   . TZAT-0001FASTVT 11/09/2011 1   . TZAT-0002FASTVT 11/09/2011 Y   . TZAT-0003FASTVT 11/09/2011 Burst   . TZAT-0004FASTVT 11/09/2011 8   . TZAT-0012FASTVT 11/09/2011 200   . TZAT-0013FASTVT 11/09/2011 1   . TZAT-0018FASTVT 11/09/2011 N   . TZAT-0019FASTVT 11/09/2011 7.5   . TZAT-0020FASTVT 11/09/2011 1.0   . TZST-0001FASTVT 11/09/2011 2   . TZST-0002FASTVT 11/09/2011 Y   . TZST-0003FASTVT 11/09/2011 15.0   . TZST-0004FASTVT 11/09/2011 Biphasic   . TZST-0007FASTVT 11/09/2011 B > AX   . TZST-0001FASTVT 11/09/2011 3   . TZST-0002FASTVT 11/09/2011 Y   . TZST-0003FASTVT 11/09/2011 25.0   . TZST-0007FASTVT 11/09/2011 B > AX   . TZST-0001FASTVT 11/09/2011 4   . TZST-0002FASTVT 11/09/2011 Y   . TZST-0003FASTVT 11/09/2011 36.0   . TZST-0004FASTVT 11/09/2011 Biphasic   . TZST-0007FASTVT 11/09/2011 B > AX   . TZST-0001FASTVT 11/09/2011 5   . TZST-0002FASTVT 11/09/2011 Y   . TZST-0003FASTVT 11/09/2011 36.0   . TZST-0004FASTVT 11/09/2011 Biphasic   . TZST-0007FASTVT 11/09/2011 B > AX   . TZON-0002SLOWVT 11/09/2011 ATP + 4 Shocks   . TZON-0003SLOWVT 11/09/2011 330   . TZON-0004SLOWVT 11/09/2011 35   . TZON-0005SLOWVT 11/09/2011 6   . TZON-0008SLOWVT 11/09/2011 Off   . TZON-0010SLOWVT 11/09/2011 80   . TZAT-0001SLOWVT 11/09/2011 1   . TZAT-0002SLOWVT 11/09/2011 Y   . TZAT-0003SLOWVT 11/09/2011 Burst   . TZAT-0004SLOWVT 11/09/2011 8   .  TZAT-0012SLOWVT 11/09/2011 200   . TZAT-0013SLOWVT 11/09/2011 3   . TZAT-0018SLOWVT 11/09/2011 N   . TZAT-0019SLOWVT 11/09/2011 7.5   . TZAT-0020SLOWVT 11/09/2011 1.0   . TZST-0001SLOWVT 11/09/2011 2   . TZST-0002SLOWVT 11/09/2011 Y   . TZST-0003SLOWVT 11/09/2011 15.0   .  TZST-0004SLOWVT 11/09/2011 Biphasic   . TZST-0007SLOWVT 11/09/2011 B > AX   . TZST-0001SLOWVT 11/09/2011 3   . TZST-0002SLOWVT 11/09/2011 Y   . TZST-0003SLOWVT 11/09/2011 25.0   . TZST-0004SLOWVT 11/09/2011 Biphasic   . TZST-0007SLOWVT 11/09/2011 B > AX   . TZST-0001SLOWVT 11/09/2011 4   . TZST-0002SLOWVT 11/09/2011 Y   . TZST-0003SLOWVT 11/09/2011 36.0   . TZST-0004SLOWVT 11/09/2011 Biphasic   . TZST-0007SLOWVT 11/09/2011 B > AX   . TZST-0001SLOWVT 11/09/2011 5   . TZST-0002SLOWVT 11/09/2011 Y   . TZST-0004SLOWVT 11/09/2011 Biphasic   . TZST-0007SLOWVT 11/09/2011 B > AX   Orders Only on 10/07/2011  Component Date Value  . Sodium 10/15/2011 143   . Potassium 10/15/2011 4.7   . Chloride 10/15/2011 110   . CO2 10/15/2011 24   . Glucose, Bld 10/15/2011 110*  . BUN 10/15/2011 32*  . Creat 10/15/2011 1.22   . Calcium 10/15/2011 8.7      Results for this Opt Visit:     Results for orders placed in visit on 12/06/11  BASIC METABOLIC PANEL      Component Value Range   Sodium 143  135 - 145 mEq/L   Potassium 4.5  3.5 - 5.3 mEq/L   Chloride 110  96 - 112 mEq/L   CO2 26  19 - 32 mEq/L   Glucose, Bld 105 (*) 70 - 99 mg/dL   BUN 31 (*) 6 - 23 mg/dL   Creat 6.29 (*) 5.28 - 1.35 mg/dL   Calcium 9.3  8.4 - 41.3 mg/dL    EKG Orders placed in visit on 10/25/11  . EKG 12-LEAD     Prior Assessment and Plan Problem List as of 12/27/2011            Cardiology Problems   HYPERTENSION, MILD   Last Assessment & Plan Note   08/09/2011 Office Visit Signed 08/09/2011  2:37 PM by Kathlen Brunswick, MD    Blood pressure control is good; current medication will be continued.    CARDIOMYOPATHY, DILATED   Last  Assessment & Plan Note   10/25/2011 Office Visit Signed 10/25/2011 12:47 PM by Kathlen Brunswick, MD    There is not much evidence for congestion at present.  BNP was only 330 recently.  Chest x-ray showed no signs of CHF.  He has no jugular venous distention, mild peripheral edema, and weight has increased only a few pounds.  For now, and carvedilol dosage will be increased.  Consideration will be given to increasing his dose of diuretic, but this has resulted in progressive renal insufficiency in the past.    Orthostatic hypotension   Last Assessment & Plan Note   10/25/2011 Office Visit Signed 10/25/2011 12:50 PM by Kathlen Brunswick, MD    No significant change in blood pressure when standing at today's visit.    Atrial fibrillation   Last Assessment & Plan Note   11/09/2011 Office Visit Addendum 11/09/2011  4:16 PM by Marinus Maw, MD    He is in atrial fib and has been for several months. His rate is well controlled. WE discussed the possible treatment options. I have discussed admit for tikosyn but for now will hold off on this.    Cardiorenal syndrome     Other   DEGENERATIVE JOINT DISEASE   Obstructive sleep apnea   Last Assessment & Plan Note   12/07/2011 Office Visit Signed 12/13/2011  9:04 PM by Waymon Budge, MD    Good compliance and control,  confirmed by his wife today    DYSPNEA ON EXERTION   Last Assessment & Plan Note   10/25/2011 Office Visit Signed 10/26/2011  7:27 PM by Kathlen Brunswick, MD    6 minute walk test in 10/2011:600 feet, normal oxygen saturation, slight increase in heart rate to a maximum in the 90s.    ICD (implantable cardiac defibrillator) in place   Last Assessment & Plan Note   11/09/2011 Office Visit Signed 11/09/2011  4:13 PM by Marinus Maw, MD    His device is working normally. Will recheck in several months.    Chronic anticoagulation   Last Assessment & Plan Note   08/09/2011 Office Visit Signed 08/09/2011  2:36 PM by Kathlen Brunswick, MD    Dr. Ouida Sills  is managing anticoagulation with warfarin with some difficulty in achieving stable and therapeutic anticoagulation.        Imaging: No results found.   FRS Calculation: Score not calculated

## 2011-12-27 NOTE — Assessment & Plan Note (Signed)
No evidence for occult GI blood loss.  Anticoagulation management per Dr. Ouida Sills.

## 2011-12-28 ENCOUNTER — Encounter: Payer: Self-pay | Admitting: *Deleted

## 2012-01-03 ENCOUNTER — Telehealth: Payer: Self-pay | Admitting: Cardiology

## 2012-01-03 NOTE — Telephone Encounter (Signed)
Patient's wife states that Cholesterol med was called in to [facility] for patient.  States that she was not aware of him needing to take this med.  There is no mention of med in note or on med list.  Please call patient's wife to clarify. / tg

## 2012-01-03 NOTE — Telephone Encounter (Signed)
This script was entered in wrong chart and cancelled immediately, however reached the pharmacy.  Pharmacy contacted and instructed to put medication back and wife aware.

## 2012-01-19 ENCOUNTER — Other Ambulatory Visit: Payer: Self-pay | Admitting: Adult Health

## 2012-01-22 ENCOUNTER — Other Ambulatory Visit: Payer: Self-pay | Admitting: Adult Health

## 2012-02-04 ENCOUNTER — Other Ambulatory Visit: Payer: Self-pay | Admitting: *Deleted

## 2012-02-04 DIAGNOSIS — E782 Mixed hyperlipidemia: Secondary | ICD-10-CM

## 2012-02-07 LAB — BASIC METABOLIC PANEL
BUN: 42 mg/dL — ABNORMAL HIGH (ref 6–23)
Creat: 1.38 mg/dL — ABNORMAL HIGH (ref 0.50–1.35)
Glucose, Bld: 117 mg/dL — ABNORMAL HIGH (ref 70–99)
Potassium: 4.8 mEq/L (ref 3.5–5.3)

## 2012-02-08 ENCOUNTER — Encounter: Payer: Self-pay | Admitting: *Deleted

## 2012-02-14 ENCOUNTER — Encounter: Payer: Self-pay | Admitting: Internal Medicine

## 2012-02-14 ENCOUNTER — Telehealth: Payer: Self-pay | Admitting: Cardiology

## 2012-02-14 ENCOUNTER — Ambulatory Visit (INDEPENDENT_AMBULATORY_CARE_PROVIDER_SITE_OTHER): Payer: Medicare Other | Admitting: *Deleted

## 2012-02-14 DIAGNOSIS — Z95810 Presence of automatic (implantable) cardiac defibrillator: Secondary | ICD-10-CM

## 2012-02-14 DIAGNOSIS — I428 Other cardiomyopathies: Secondary | ICD-10-CM

## 2012-02-14 NOTE — Telephone Encounter (Signed)
Patient wife called regarding Lasix dose; patient was taking 40mg  until last visit and was changed to 40 mg M-W-F and 20mg  T-Th.  Patient wife states patient feet are swelling.   [phone] is son's number, they will be there after 2:00pm.

## 2012-02-15 ENCOUNTER — Encounter: Payer: Self-pay | Admitting: *Deleted

## 2012-02-15 ENCOUNTER — Telehealth: Payer: Self-pay | Admitting: Cardiology

## 2012-02-15 LAB — REMOTE ICD DEVICE
AL IMPEDENCE ICD: 300 Ohm
BRDY-0002RV: 60 {beats}/min
BRDY-0003RV: 100 {beats}/min
HV IMPEDENCE: 39 Ohm
RV LEAD IMPEDENCE ICD: 380 Ohm
TZAT-0013SLOWVT: 3
TZAT-0018FASTVT: NEGATIVE
TZAT-0018SLOWVT: NEGATIVE
TZAT-0019FASTVT: 7.5 V
TZAT-0020FASTVT: 1 ms
TZON-0003FASTVT: 300 ms
TZON-0003SLOWVT: 330 ms
TZON-0004FASTVT: 24
TZON-0005FASTVT: 6
TZON-0010FASTVT: 80 ms
TZST-0001FASTVT: 2
TZST-0001FASTVT: 3
TZST-0001FASTVT: 4
TZST-0001SLOWVT: 2
TZST-0001SLOWVT: 3
TZST-0001SLOWVT: 5
TZST-0003FASTVT: 36 J
TZST-0003FASTVT: 36 J
TZST-0003SLOWVT: 25 J
VENTRICULAR PACING ICD: 17 pct

## 2012-02-15 NOTE — Telephone Encounter (Signed)
If he has no symptoms of CHF, has not gained weight, edema is not severe, and edema is not bothering him, he does not need to be seen.  Otherwise first available appt.

## 2012-02-15 NOTE — Telephone Encounter (Signed)
Please refer to "closed encounter" on 02/14/12.Marland Kitchen / tg

## 2012-02-15 NOTE — Telephone Encounter (Signed)
Wife states that Mr Valek has experienced an increase in pedal edema.  Currently taking Lasix 40 mg on M,W,F and 20 mg all other days.  Denies any weight gain, in fact, has lost 10 pounds.   States watching sodium and elevating feet, while seated.  Denies SOB, however has an occasional dry cough.  Follow up appt is 11/19.  Please advise.

## 2012-02-16 NOTE — Telephone Encounter (Signed)
Advised wife of recommendations and verbalized understanding.

## 2012-02-24 ENCOUNTER — Encounter: Payer: Self-pay | Admitting: *Deleted

## 2012-03-06 ENCOUNTER — Encounter: Payer: Self-pay | Admitting: Cardiology

## 2012-03-18 ENCOUNTER — Other Ambulatory Visit: Payer: Self-pay | Admitting: Adult Health

## 2012-03-20 ENCOUNTER — Other Ambulatory Visit: Payer: Self-pay | Admitting: Cardiology

## 2012-03-28 ENCOUNTER — Encounter: Payer: Self-pay | Admitting: Cardiology

## 2012-03-28 ENCOUNTER — Ambulatory Visit (INDEPENDENT_AMBULATORY_CARE_PROVIDER_SITE_OTHER): Payer: Medicare Other | Admitting: Cardiology

## 2012-03-28 VITALS — BP 90/58 | HR 75 | Ht 68.0 in | Wt 182.4 lb

## 2012-03-28 DIAGNOSIS — I1310 Hypertensive heart and chronic kidney disease without heart failure, with stage 1 through stage 4 chronic kidney disease, or unspecified chronic kidney disease: Secondary | ICD-10-CM

## 2012-03-28 DIAGNOSIS — Z7901 Long term (current) use of anticoagulants: Secondary | ICD-10-CM

## 2012-03-28 DIAGNOSIS — I428 Other cardiomyopathies: Secondary | ICD-10-CM

## 2012-03-28 DIAGNOSIS — I4891 Unspecified atrial fibrillation: Secondary | ICD-10-CM

## 2012-03-28 DIAGNOSIS — I951 Orthostatic hypotension: Secondary | ICD-10-CM

## 2012-03-28 DIAGNOSIS — I10 Essential (primary) hypertension: Secondary | ICD-10-CM

## 2012-03-28 MED ORDER — FUROSEMIDE 40 MG PO TABS: 40.0000 mg | ORAL_TABLET | Freq: Every day | ORAL | Status: DC

## 2012-03-28 NOTE — Progress Notes (Signed)
Patient ID: Mario Cline, male   DOB: 1929-04-05, 78 y.o.   MRN: [id number]  HPI: Scheduled return visit for this very nice gentleman with cardiomyopathy.  Since his last visit, he developed increased pedal edema prompting Dr. Ouida Sills to transiently increase his dose of furosemide, which adequately managed the problem.  Patient denies any significant salt intake and appears to be cognizant of foods to avoid.  He sleeps on 2 pillows and thinks that he may be prone to PND and orthopnea, but it is not clear to me that he is actually suffered from these symptoms.  He maintains a sedentary lifestyle, partially at the request of his family, with probably class III dyspnea on exertion.  Prior to Admission medications   Medication Sig Start Date End Date Taking? Authorizing Provider  acetaminophen (TYLENOL) 500 MG tablet Take 500 mg by mouth every 6 (six) hours as needed. For headache    Yes Historical Provider, MD  ALDACTONE 25 MG tablet TAKE ONE TABLET DAILY. 05/03/11  Yes Kathlen Brunswick, MD  carvedilol (COREG) 12.5 MG tablet Take 1 tablet (12.5 mg total) by mouth 2 (two) times daily with a meal. 10/25/11  Yes Kathlen Brunswick, MD  COUMADIN 5 MG tablet TAKE AS DIRECTED. 03/20/12  Yes Kathlen Brunswick, MD  furosemide (LASIX) 40 MG tablet Take 1 tablet (40 mg total) by mouth daily. TAKE 2 TIMES A DAY FOR WEIGHT > 180 UNTIL REACHES 175 LB OTHERWISE TAKE ONCE DAILY 03/28/12  Yes Kathlen Brunswick, MD  losartan (COZAAR) 100 MG tablet Take 1 tablet (100 mg total) by mouth daily. 09/24/11 09/23/12 Yes Kathlen Brunswick, MD  Multiple Vitamins-Minerals (MULTIVITAMINS THER. W/MINERALS) TABS Take 1 tablet by mouth daily.     Yes Historical Provider, MD   No Known Allergies    Past medical history, social history, and family history reviewed and updated.  ROS: Denies chest pain, palpitations, lightheadedness and syncope.  All other systems reviewed and are negative.  PHYSICAL EXAM: BP 90/58  Pulse 75  Ht 5\' 8"   (1.727 m)  Wt 82.736 kg (182 lb 6.4 oz)  BMI 27.73 kg/m2  General-Well developed; no acute distress Body habitus-proportionate weight and height Neck-No JVD; no carotid bruits Lungs-clear lung fields; resonant to percussion Cardiovascular-normal PMI; normal S1 and S2; irregular rhythm; modest early systolic ejection murmur at the cardiac base Abdomen-normal bowel sounds; soft and non-tender without masses or organomegaly Musculoskeletal-No deformities, no cyanosis or clubbing Neurologic-Normal cranial nerves; symmetric strength and tone Skin-Warm, no significant lesions Extremities-distal pulses intact; 1-2+ edema on the right; 1+ on the left  ASSESSMENT AND PLAN:  Ubly Bing, MD 03/28/2012 12:09 PM

## 2012-03-28 NOTE — Assessment & Plan Note (Signed)
Renal function remained stable with a fairly modest dose of diuretic.  Closer monitoring will be necessary if it proves necessary to increase daily furosemide dosage.

## 2012-03-28 NOTE — Assessment & Plan Note (Signed)
Continues to be compensated by exam, weight and symptoms.  Patient already weighs himself daily with stable values around 175 pounds at home.  He is advised to increase furosemide to 40 mg twice a day whenever weight increases to 180 and continue that dose until a weight of 175 pounds is reestablished.

## 2012-03-28 NOTE — Assessment & Plan Note (Addendum)
Atrial fibrillation with controlled ventricular response persists without definite symptoms related to this arrhythmia.  Our strategy will continue to be rate control and anticoagulation.

## 2012-03-28 NOTE — Patient Instructions (Addendum)
Your physician recommends that you schedule a follow-up appointment in: 6 months  Your physician has recommended you make the following change in your medication:  TAKE Lasix 40 mg daily.  If weight is above 180, take twice a day until you reach 175 pounds, then resume daily.  Call us if you are having to take it twice a day on a frequent basis

## 2012-03-28 NOTE — Assessment & Plan Note (Signed)
Blood pressure continues to be quite low, but patient denies any orthostatic symptoms, lightheadedness or syncope.  He has not fallen.  We will continue to maximize doses of cardiac medication as permitted by blood pressure.

## 2012-03-28 NOTE — Assessment & Plan Note (Signed)
No evidence for occult GI blood loss.

## 2012-03-28 NOTE — Progress Notes (Deleted)
Name: Mario Cline    DOB: 04/04/1929  Age: 78 y.o.  MR#: [id number]       PCP:  Carylon Perches, MD      Insurance: @PAYORNAME @   CC:   No chief complaint on file.   VS BP 90/58  Pulse 75  Ht 5\' 8"  (1.727 m)  Wt 182 lb 6.4 oz (82.736 kg)  BMI 27.73 kg/m2  Weights Current Weight  03/28/12 182 lb 6.4 oz (82.736 kg)  12/27/11 186 lb (84.369 kg)  12/07/11 194 lb 3.2 oz (88.089 kg)    Blood Pressure  BP Readings from Last 3 Encounters:  03/28/12 90/58  12/27/11 98/65  12/07/11 98/60     Admit date:  (Not on file) Last encounter with [facility]:  03/20/2012   Allergy No Known Allergies  Current Outpatient Prescriptions  Medication Sig Dispense Refill  . acetaminophen (TYLENOL) 500 MG tablet Take 500 mg by mouth every 6 (six) hours as needed. For headache       . ALDACTONE 25 MG tablet TAKE ONE TABLET DAILY.  90 each  0  . carvedilol (COREG) 12.5 MG tablet Take 1 tablet (12.5 mg total) by mouth 2 (two) times daily with a meal.  60 tablet  12  . COUMADIN 5 MG tablet TAKE AS DIRECTED.  30 tablet  3  . furosemide (LASIX) 40 MG tablet Take 40 mg by mouth daily.      Marland Kitchen losartan (COZAAR) 100 MG tablet Take 1 tablet (100 mg total) by mouth daily.  90 tablet  3  . Multiple Vitamins-Minerals (MULTIVITAMINS THER. W/MINERALS) TABS Take 1 tablet by mouth daily.        . [DISCONTINUED] furosemide (LASIX) 40 MG tablet 40 mg once daily M.W.F and 20 mg daily all other days  30 tablet  12  . [DISCONTINUED] ALDACTONE 25 MG tablet TAKE ONE TABLET DAILY.  90 tablet  2    Discontinued Meds:    Medications Discontinued During This Encounter  Medication Reason  . ALDACTONE 25 MG tablet Error  . furosemide (LASIX) 40 MG tablet     Patient Active Problem List  Diagnosis  . HYPERTENSION, MILD  . CARDIOMYOPATHY, DILATED  . DEGENERATIVE JOINT DISEASE  . Obstructive sleep apnea  . DYSPNEA ON EXERTION  . ICD -St.Jude  . Orthostatic hypotension  . Atrial fibrillation  . Chronic anticoagulation  .  Cardiorenal syndrome    LABS Clinical Support on 02/14/2012  Component Date Value  . DEVICE MODEL ICD 02/14/2012 409811   . DEV-0014ICD 02/14/2012 [name]   M.D.   . Nira Conn 02/14/2012 N   . [id number] 02/14/2012 [name]   M.D.   . Sherlon Handing 02/14/2012 [name]   M.D.   . EVAL-0005E8 02/14/2012 [employer] Jude Medical Monitoring System   . EVAL-0022E8 02/14/2012                     Value:ICD remote received. Sensing, impedances, auto capture thresholds consistent with previous device measurements. Histograms appropriate for patient and level of activity. A-fib, + Pradaxa.   No ventricular arrhythmias recorded. All other diagnostic data                          reviewed and is appropriate and stable for patient. Real time EGM demonstrates appropriate sensing and capture. Plan to check remotely in 3 months, see in office annually.  Next remote 05/22/12.  Marland Kitchen AL IMPEDENCE ICD 02/14/2012 300   .  RV LEAD IMPEDENCE ICD 02/14/2012 380   . HV IMPEDENCE 02/14/2012 39   . BATTERY VOLTAGE 02/14/2012 2.6   . VENTRICULAR PACING ICD 02/14/2012 17   . ATRIAL PACING ICD 02/14/2012 1   . RV LEAD AMPLITUDE 02/14/2012 9.8   . BRDY-0001RV 02/14/2012 DDD   . BRDY-0002RV 02/14/2012 60   . BRDY-0003RV 02/14/2012 100   . BRDY-0004RV 02/14/2012 110   . BRDY-0005RV 02/14/2012 Off   . BRDY-0007RV 02/14/2012 Atrial Pace   . BRDY-0008RV 02/14/2012 Off   . BRDY-0009RV 02/14/2012 No   . BRDY-0010RV 02/14/2012 V.Safety=On   . BAMS-0001 02/14/2012 150   . BAMS-0003 02/14/2012 70   . TZON-0002FASTVT 02/14/2012 ATP + 4 Shocks   . TZON-0003FASTVT 02/14/2012 300   . TZON-0004FASTVT 02/14/2012 24   . TZON-0005FASTVT 02/14/2012 6   . TZON-0008FASTVT 02/14/2012 Off   . TZON-0010FASTVT 02/14/2012 80   . TZAT-0001FASTVT 02/14/2012 1   . TZAT-0002FASTVT 02/14/2012 Y   . TZAT-0003FASTVT 02/14/2012 Burst   . TZAT-0004FASTVT 02/14/2012 8   . TZAT-0012FASTVT 02/14/2012 200   . TZAT-0013FASTVT 02/14/2012 1    . TZAT-0018FASTVT 02/14/2012 N   . TZAT-0019FASTVT 02/14/2012 7.5   . TZAT-0020FASTVT 02/14/2012 1.0   . TZST-0001FASTVT 02/14/2012 2   . TZST-0002FASTVT 02/14/2012 Y   . TZST-0003FASTVT 02/14/2012 15.0   . TZST-0004FASTVT 02/14/2012 Biphasic   . TZST-0007FASTVT 02/14/2012 B > AX   . TZST-0001FASTVT 02/14/2012 3   . TZST-0002FASTVT 02/14/2012 Y   . TZST-0003FASTVT 02/14/2012 25.0   . TZST-0007FASTVT 02/14/2012 B > AX   . TZST-0001FASTVT 02/14/2012 4   . TZST-0002FASTVT 02/14/2012 Y   . TZST-0003FASTVT 02/14/2012 36.0   . TZST-0004FASTVT 02/14/2012 Biphasic   . TZST-0007FASTVT 02/14/2012 B > AX   . TZST-0001FASTVT 02/14/2012 5   . TZST-0002FASTVT 02/14/2012 Y   . TZST-0003FASTVT 02/14/2012 36.0   . TZST-0004FASTVT 02/14/2012 Biphasic   . TZST-0007FASTVT 02/14/2012 B > AX   . TZON-0002SLOWVT 02/14/2012 ATP + 4 Shocks   . TZON-0003SLOWVT 02/14/2012 330   . TZON-0004SLOWVT 02/14/2012 35   . TZON-0005SLOWVT 02/14/2012 6   . TZON-0008SLOWVT 02/14/2012 Off   . TZON-0010SLOWVT 02/14/2012 80   . TZAT-0001SLOWVT 02/14/2012 1   . TZAT-0002SLOWVT 02/14/2012 Y   . TZAT-0003SLOWVT 02/14/2012 Burst   . TZAT-0004SLOWVT 02/14/2012 8   . TZAT-0012SLOWVT 02/14/2012 200   . TZAT-0013SLOWVT 02/14/2012 3   . TZAT-0018SLOWVT 02/14/2012 N   . TZAT-0019SLOWVT 02/14/2012 7.5   . TZAT-0020SLOWVT 02/14/2012 1.0   . TZST-0001SLOWVT 02/14/2012 2   . TZST-0002SLOWVT 02/14/2012 Y   . TZST-0003SLOWVT 02/14/2012 15.0   . TZST-0004SLOWVT 02/14/2012 Biphasic   . TZST-0007SLOWVT 02/14/2012 B > AX   . TZST-0001SLOWVT 02/14/2012 3   . TZST-0002SLOWVT 02/14/2012 Y   . TZST-0003SLOWVT 02/14/2012 25.0   . TZST-0004SLOWVT 02/14/2012 Biphasic   . TZST-0007SLOWVT 02/14/2012 B > AX   . TZST-0001SLOWVT 02/14/2012 4   . TZST-0002SLOWVT 02/14/2012 Y   . TZST-0003SLOWVT 02/14/2012 36.0   . TZST-0004SLOWVT 02/14/2012 Biphasic   . TZST-0007SLOWVT 02/14/2012 B > AX   . TZST-0001SLOWVT 02/14/2012 5   .  TZST-0002SLOWVT 02/14/2012 Y   . TZST-0004SLOWVT 02/14/2012 Biphasic   . TZST-0007SLOWVT 02/14/2012 B > AX   Orders Only on 02/04/2012  Component Date Value  . Sodium 02/07/2012 143   . Potassium 02/07/2012 4.8   . Chloride 02/07/2012 110   . CO2 02/07/2012 26   . Glucose, Bld 02/07/2012 117*  . BUN 02/07/2012 42*  . Creat 02/07/2012 1.38*  . Calcium 02/07/2012 9.6  Results for this Opt Visit:     Results for orders placed in visit on 02/14/12  REMOTE ICD DEVICE      Component Value Range   DEVICE MODEL ICD 454098     DEV-0014ICD [name]   M.D.     DEV-0020ICD N     DEV-0014LDO [name]   M.D.     [id number] [name]   M.D.     EVAL-0005E8 [employer] Jude Medical Monitoring System     [phone]       Value: ICD remote received. Sensing, impedances, auto capture thresholds consistent with previous device measurements. Histograms appropriate for patient and level of activity. A-fib, + Pradaxa.   No ventricular arrhythmias recorded. All other diagnostic data      reviewed and is appropriate and stable for patient. Real time EGM demonstrates appropriate sensing and capture. Plan to check remotely in 3 months, see in office annually.  Next remote 05/22/12.   AL IMPEDENCE ICD 300     RV LEAD IMPEDENCE ICD 380     HV IMPEDENCE 39     BATTERY VOLTAGE 2.6     VENTRICULAR PACING ICD 17     ATRIAL PACING ICD 1     RV LEAD AMPLITUDE 9.8     BRDY-0001RV DDD     BRDY-0002RV 60     BRDY-0003RV 100     BRDY-0004RV 110     BRDY-0005RV Off     BRDY-0007RV Atrial Pace     BRDY-0008RV Off     BRDY-0009RV No     BRDY-0010RV V.Safety=On     BAMS-0001 150     BAMS-0003 70     TZON-0002FASTVT ATP + 4 Shocks     TZON-0003FASTVT 300     TZON-0004FASTVT 24     TZON-0005FASTVT 6     TZON-0008FASTVT Off     TZON-0010FASTVT 80     TZAT-0001FASTVT 1     TZAT-0002FASTVT Y     TZAT-0003FASTVT Burst     TZAT-0004FASTVT 8     TZAT-0012FASTVT 200     TZAT-0013FASTVT 1      TZAT-0018FASTVT N     TZAT-0019FASTVT 7.5     TZAT-0020FASTVT 1.0     TZST-0001FASTVT 2     TZST-0002FASTVT Y     TZST-0003FASTVT 15.0     TZST-0004FASTVT Biphasic     TZST-0007FASTVT B > AX     TZST-0001FASTVT 3     TZST-0002FASTVT Y     TZST-0003FASTVT 25.0     TZST-0007FASTVT B > AX     TZST-0001FASTVT 4     TZST-0002FASTVT Y     TZST-0003FASTVT 36.0     TZST-0004FASTVT Biphasic     TZST-0007FASTVT B > AX     TZST-0001FASTVT 5     TZST-0002FASTVT Y     TZST-0003FASTVT 36.0     TZST-0004FASTVT Biphasic     TZST-0007FASTVT B > AX     TZON-0002SLOWVT ATP + 4 Shocks     TZON-0003SLOWVT 330     TZON-0004SLOWVT 35     TZON-0005SLOWVT 6     TZON-0008SLOWVT Off     TZON-0010SLOWVT 80     TZAT-0001SLOWVT 1     TZAT-0002SLOWVT Y     TZAT-0003SLOWVT Burst     TZAT-0004SLOWVT 8     TZAT-0012SLOWVT 200     TZAT-0013SLOWVT 3     TZAT-0018SLOWVT N     TZAT-0019SLOWVT 7.5     TZAT-0020SLOWVT 1.0     TZST-0001SLOWVT 2  TZST-0002SLOWVT Y     TZST-0003SLOWVT 15.0     TZST-0004SLOWVT Biphasic     TZST-0007SLOWVT B > AX     TZST-0001SLOWVT 3     TZST-0002SLOWVT Y     TZST-0003SLOWVT 25.0     TZST-0004SLOWVT Biphasic     TZST-0007SLOWVT B > AX     TZST-0001SLOWVT 4     TZST-0002SLOWVT Y     TZST-0003SLOWVT 36.0     TZST-0004SLOWVT Biphasic     TZST-0007SLOWVT B > AX     TZST-0001SLOWVT 5     TZST-0002SLOWVT Y     TZST-0004SLOWVT Biphasic     TZST-0007SLOWVT B > AX      EKG Orders placed in visit on 03/28/12  . EKG 12-LEAD     Prior Assessment and Plan Problem List as of 03/28/2012            Cardiology Problems   HYPERTENSION, MILD   Last Assessment & Plan Note   12/27/2011 Office Visit Signed 12/27/2011  2:46 PM by Kathlen Brunswick, MD    Hypertension has resolved in the setting of treatment of cardiomyopathy.    CARDIOMYOPATHY, DILATED   Last Assessment & Plan Note   12/27/2011 Office Visit Signed 12/27/2011  2:48 PM by Kathlen Brunswick, MD    Patient  appears well compensated at present.  Recent BNP level was only minimally elevated to 300.  Recent review by Dr. Ladona Ridgel showed that rhythm was paced approximately 33% of the time.  This was not thought to be an indication for biventricular pacing.  Current medication doses appear to be about the highest that blood pressure will tolerate.    Orthostatic hypotension   Last Assessment & Plan Note   12/27/2011 Office Visit Signed 12/27/2011  2:53 PM by Kathlen Brunswick, MD    Dizziness has resolved despite systolics that are occasionally at near hypotensive levels.  Unless patient develops more impressive symptoms, cardiac drug doses will be maintained at their present level.    Atrial fibrillation   Last Assessment & Plan Note   12/27/2011 Office Visit Signed 12/27/2011  2:49 PM by Kathlen Brunswick, MD    Maintenance of sinus rhythm might improve patient's functional status, but substantial benefit is unlikely.  Current level of function is acceptable to patient.  We will defer more aggressive treatment of atrial arrhythmias pending worsening of patient's symptomatic status.    Cardiorenal syndrome   Last Assessment & Plan Note   12/27/2011 Office Visit Signed 12/27/2011  2:50 PM by Kathlen Brunswick, MD    Stable with only minimal elevation in creatinine.      Other   DEGENERATIVE JOINT DISEASE   Obstructive sleep apnea   Last Assessment & Plan Note   12/27/2011 Office Visit Signed 12/27/2011  2:48 PM by Kathlen Brunswick, MD    Managed and recently evaluated by Dr. Maple Hudson, who felt that current therapy was effective and appropriate.    DYSPNEA ON EXERTION   Last Assessment & Plan Note   10/25/2011 Office Visit Signed 10/26/2011  7:27 PM by Kathlen Brunswick, MD    6 minute walk test in 10/2011:600 feet, normal oxygen saturation, slight increase in heart rate to a maximum in the 90s.    ICD -St.Jude   Last Assessment & Plan Note   11/09/2011 Office Visit Signed 11/09/2011  4:13 PM by Marinus Maw,  MD    His device is working normally. Will recheck in several months.  Chronic anticoagulation   Last Assessment & Plan Note   12/27/2011 Office Visit Signed 12/27/2011  2:50 PM by Kathlen Brunswick, MD    No evidence for occult GI blood loss.  Anticoagulation management per Dr. Ouida Sills.        Imaging: No results found.   FRS Calculation: Score not calculated

## 2012-04-14 ENCOUNTER — Encounter: Payer: Self-pay | Admitting: Adult Health

## 2012-05-22 ENCOUNTER — Encounter: Payer: Self-pay | Admitting: Internal Medicine

## 2012-05-22 ENCOUNTER — Ambulatory Visit (INDEPENDENT_AMBULATORY_CARE_PROVIDER_SITE_OTHER): Payer: Medicare Other | Admitting: *Deleted

## 2012-05-22 DIAGNOSIS — I428 Other cardiomyopathies: Secondary | ICD-10-CM

## 2012-05-22 DIAGNOSIS — Z95810 Presence of automatic (implantable) cardiac defibrillator: Secondary | ICD-10-CM

## 2012-05-24 LAB — REMOTE ICD DEVICE
BAMS-0003: 70 {beats}/min
BRDY-0002RV: 60 {beats}/min
BRDY-0003RV: 100 {beats}/min
BRDY-0004RV: 110 {beats}/min
DEV-0020ICD: NEGATIVE
RV LEAD AMPLITUDE: 7.3 mv
TZAT-0001SLOWVT: 1
TZAT-0004SLOWVT: 8
TZAT-0012FASTVT: 200 ms
TZAT-0012SLOWVT: 200 ms
TZAT-0013FASTVT: 1
TZAT-0019FASTVT: 7.5 V
TZAT-0019SLOWVT: 7.5 V
TZAT-0020FASTVT: 1 ms
TZON-0004FASTVT: 24
TZON-0004SLOWVT: 35
TZON-0005FASTVT: 6
TZST-0001FASTVT: 3
TZST-0001FASTVT: 4
TZST-0001FASTVT: 5
TZST-0001SLOWVT: 3
TZST-0001SLOWVT: 5
TZST-0003FASTVT: 25 J
TZST-0003FASTVT: 36 J
TZST-0003SLOWVT: 15 J
TZST-0003SLOWVT: 36 J

## 2012-06-08 ENCOUNTER — Encounter: Payer: Self-pay | Admitting: *Deleted

## 2012-07-26 ENCOUNTER — Telehealth: Payer: Self-pay | Admitting: Cardiology

## 2012-07-26 NOTE — Telephone Encounter (Signed)
Spoke with wife.  Sounds like a ruptured blood vessel in eye.  No other sign of bleeding.  Coumadin was checked by Dr Ouida Sills last week with no dose changes.  Wife will monitor and call if any changes.

## 2012-07-26 NOTE — Telephone Encounter (Signed)
PT HAS A BLOOD SHOT EYE AND IS CONCERNED BECAUSE OF BEING ON COUMADIN

## 2012-08-21 ENCOUNTER — Ambulatory Visit (INDEPENDENT_AMBULATORY_CARE_PROVIDER_SITE_OTHER): Payer: Medicare Other | Admitting: *Deleted

## 2012-08-21 ENCOUNTER — Other Ambulatory Visit: Payer: Self-pay | Admitting: Internal Medicine

## 2012-08-21 DIAGNOSIS — I428 Other cardiomyopathies: Secondary | ICD-10-CM

## 2012-08-21 DIAGNOSIS — Z95810 Presence of automatic (implantable) cardiac defibrillator: Secondary | ICD-10-CM

## 2012-08-22 LAB — REMOTE ICD DEVICE
BAMS-0001: 150 {beats}/min
BRDY-0002RV: 60 {beats}/min
BRDY-0003RV: 100 {beats}/min
HV IMPEDENCE: 38 Ohm
RV LEAD IMPEDENCE ICD: 350 Ohm
TZAT-0004FASTVT: 8
TZAT-0004SLOWVT: 8
TZAT-0012SLOWVT: 200 ms
TZAT-0013FASTVT: 1
TZAT-0013SLOWVT: 3
TZAT-0018FASTVT: NEGATIVE
TZAT-0019FASTVT: 7.5 V
TZON-0003FASTVT: 300 ms
TZON-0003SLOWVT: 330 ms
TZON-0004FASTVT: 24
TZON-0010FASTVT: 80 ms
TZST-0001FASTVT: 3
TZST-0001FASTVT: 4
TZST-0001SLOWVT: 2
TZST-0001SLOWVT: 3
TZST-0001SLOWVT: 5
TZST-0003FASTVT: 25 J
TZST-0003FASTVT: 36 J
TZST-0003SLOWVT: 25 J
VENTRICULAR PACING ICD: 16 pct

## 2012-09-05 ENCOUNTER — Encounter: Payer: Self-pay | Admitting: *Deleted

## 2012-09-07 ENCOUNTER — Encounter: Payer: Self-pay | Admitting: Internal Medicine

## 2012-09-26 ENCOUNTER — Ambulatory Visit (INDEPENDENT_AMBULATORY_CARE_PROVIDER_SITE_OTHER): Payer: Medicare Other | Admitting: Cardiology

## 2012-09-26 ENCOUNTER — Encounter: Payer: Self-pay | Admitting: Cardiology

## 2012-09-26 VITALS — BP 94/62 | HR 83 | Ht 70.0 in | Wt 177.0 lb

## 2012-09-26 DIAGNOSIS — Z95810 Presence of automatic (implantable) cardiac defibrillator: Secondary | ICD-10-CM

## 2012-09-26 DIAGNOSIS — Z7901 Long term (current) use of anticoagulants: Secondary | ICD-10-CM

## 2012-09-26 DIAGNOSIS — I10 Essential (primary) hypertension: Secondary | ICD-10-CM

## 2012-09-26 DIAGNOSIS — I4891 Unspecified atrial fibrillation: Secondary | ICD-10-CM

## 2012-09-26 DIAGNOSIS — I951 Orthostatic hypotension: Secondary | ICD-10-CM

## 2012-09-26 DIAGNOSIS — R0609 Other forms of dyspnea: Secondary | ICD-10-CM

## 2012-09-26 DIAGNOSIS — I428 Other cardiomyopathies: Secondary | ICD-10-CM

## 2012-09-26 DIAGNOSIS — R0989 Other specified symptoms and signs involving the circulatory and respiratory systems: Secondary | ICD-10-CM

## 2012-09-26 LAB — COMPREHENSIVE METABOLIC PANEL
ALT: 15 U/L (ref 0–53)
AST: 22 U/L (ref 0–37)
Albumin: 4.4 g/dL (ref 3.5–5.2)
Alkaline Phosphatase: 64 U/L (ref 39–117)
BUN: 54 mg/dL — ABNORMAL HIGH (ref 6–23)
Potassium: 5 mEq/L (ref 3.5–5.3)
Sodium: 139 mEq/L (ref 135–145)

## 2012-09-26 MED ORDER — LOSARTAN POTASSIUM 50 MG PO TABS: 50.0000 mg | ORAL_TABLET | Freq: Every day | ORAL | Status: DC

## 2012-09-26 NOTE — Assessment & Plan Note (Addendum)
Blood pressures remain on the low side, but without significant orthostatic change.

## 2012-09-26 NOTE — Progress Notes (Signed)
Patient ID: Mario Cline, male   DOB: [birth year], 79 y.o.   MRN: [id number]  HPI: Schedule return visit for this very pleasant older gentleman with nonischemic cardiomyopathy. Although activity levels were very limited and he likely has class III dyspnea on exertion, he has not suffered any significant decompensation for some time.  His AICD has never discharge. Weight has been stable. He has developed no new medical problems or recently required urgent medical care.  Mario Cline notes generalized weakness, particularly when he is standing, but no falls nor episodes of loss of consciousness.  Current Outpatient Prescriptions  Medication Sig Dispense Refill  . acetaminophen (TYLENOL) 500 MG tablet Take 500 mg by mouth every 6 (six) hours as needed. For headache       . ALDACTONE 25 MG tablet TAKE ONE TABLET DAILY.  90 each  0  . carvedilol (COREG) 12.5 MG tablet Take 1 tablet (12.5 mg total) by mouth 2 (two) times daily with a meal.  60 tablet  12  . COUMADIN 5 MG tablet TAKE AS DIRECTED.  30 tablet  3  . furosemide (LASIX) 40 MG tablet Take 1 tablet (40 mg total) by mouth daily. TAKE 2 TIMES A DAY FOR WEIGHT > 180 UNTIL REACHES 175 LB OTHERWISE TAKE ONCE DAILY  45 tablet  3  . losartan (COZAAR) 100 MG tablet Take 100 mg by mouth daily.      . Multiple Vitamins-Minerals (MULTIVITAMINS THER. W/MINERALS) TABS Take 1 tablet by mouth daily.         No current facility-administered medications for this visit.   No Known Allergies   Past medical history, social history, and family history reviewed and updated.  ROS: Denies orthopnea, PND, pedal edema, palpitations, lightheadedness or syncope. All other systems reviewed and are negative.  PHYSICAL EXAM: BP 88/59  Pulse 73  Ht 5\' 10"  (1.778 m)  Wt 80.287 kg (177 lb)  BMI 25.4 kg/m2;  Body mass index is 25.4 kg/(m^2). General-Well developed; no acute distress Body habitus-proportionate weight and height Neck-No JVD; no carotid bruits Lungs-clear  lung fields; resonant to percussion Cardiovascular-normal PMI; normal S1 and S2; irregular rhythm; no third heart sound Abdomen-normal bowel sounds; soft and non-tender without masses or organomegaly Musculoskeletal-No deformities, no cyanosis or clubbing Neurologic-Normal cranial nerves; symmetric strength and tone Skin-Warm, no significant lesions Extremities-distal pulses intact; no edema  EKG: Atrial fibrillation with demand ventricular pacing; occasional native complexes reveal delayed R-wave progression and left axis deviation as well as a probable prior inferior myocardial infarction; ST-T wave abnormalities suggesting lateral ischemia or LVH. No previous tracing for comparison.  Preston Bing, MD 09/26/2012  11:50 AM  ASSESSMENT AND PLAN

## 2012-09-26 NOTE — Progress Notes (Deleted)
Name: Mario Cline    DOB: Nov 21, 1928  Age: 79 y.o.  MR#: [id number]       PCP:  Carylon Perches, MD      Insurance: Payor: BLUE [employer]OSS [employer]UE [employer] [place] MEDICARE / Plan: BLUE MEDICARE / Product Type: *No Product type* /   CC:   No chief complaint on file. BOTTLES  VS Filed Vitals:   09/26/12 1133  BP: 88/59  Pulse: 73  Height: 5\' 10"  (1.778 m)  Weight: 177 lb (80.287 kg)    Weights Current Weight  09/26/12 177 lb (80.287 kg)  03/28/12 182 lb 6.4 oz (82.736 kg)  12/27/11 186 lb (84.369 kg)    Blood Pressure  BP Readings from Last 3 Encounters:  09/26/12 88/59  03/28/12 90/58  12/27/11 98/65     Admit date:  (Not on file) Last encounter with RMR:  07/26/2012   Allergy Review of patient's allergies indicates no known allergies.  Current Outpatient Prescriptions  Medication Sig Dispense Refill  . acetaminophen (TYLENOL) 500 MG tablet Take 500 mg by mouth every 6 (six) hours as needed. For headache       . ALDACTONE 25 MG tablet TAKE ONE TABLET DAILY.  90 each  0  . carvedilol (COREG) 12.5 MG tablet Take 1 tablet (12.5 mg total) by mouth 2 (two) times daily with a meal.  60 tablet  12  . COUMADIN 5 MG tablet TAKE AS DIRECTED.  30 tablet  3  . furosemide (LASIX) 40 MG tablet Take 1 tablet (40 mg total) by mouth daily. TAKE 2 TIMES A DAY FOR WEIGHT > 180 UNTIL REACHES 175 LB OTHERWISE TAKE ONCE DAILY  45 tablet  3  . losartan (COZAAR) 100 MG tablet Take 100 mg by mouth daily.      . Multiple Vitamins-Minerals (MULTIVITAMINS THER. W/MINERALS) TABS Take 1 tablet by mouth daily.         No current facility-administered medications for this visit.    Discontinued Meds:   There are no discontinued medications.  Patient Active Problem List   Diagnosis Date Noted  . Cardiorenal syndrome 10/02/2011  . Chronic anticoagulation   . Orthostatic hypotension   . ICD -St.Jude 02/10/2011  . Obstructive sleep apnea 05/07/2010  . DYSPNEA ON EXERTION 05/07/2010  . Atrial fibrillation  04/09/2009  . HYPERTENSION, MILD 12/11/2008  . Nonischemic cardiomyopathy 12/11/2008  . DEGENERATIVE JOINT DISEASE 12/11/2008    LABS    Component Value Date/Time   NA 143 02/07/2012 0805   NA 143 12/06/2011 0832   NA 143 10/15/2011 0730   K 4.8 02/07/2012 0805   K 4.5 12/06/2011 0832   K 4.7 10/15/2011 0730   CL 110 02/07/2012 0805   CL 110 12/06/2011 0832   CL 110 10/15/2011 0730   CO2 26 02/07/2012 0805   CO2 26 12/06/2011 0832   CO2 24 10/15/2011 0730   GLUCOSE 117* 02/07/2012 0805   GLUCOSE 105* 12/06/2011 0832   GLUCOSE 110* 10/15/2011 0730   BUN 42* 02/07/2012 0805   BUN 31* 12/06/2011 0832   BUN 32* 10/15/2011 0730   CREATININE 1.38* 02/07/2012 0805   CREATININE 1.41* 12/06/2011 0832   CREATININE 1.22 10/15/2011 0730   CREATININE 1.26 04/12/2011 0723   CREATININE 1.37 03/30/2010 1319   CREATININE 1.45 03/26/2010 1954   CALCIUM 9.6 02/07/2012 0805   CALCIUM 9.3 12/06/2011 0832   CALCIUM 8.7 10/15/2011 0730   GFRNONAA 52* 04/12/2011 0723   GFRNONAA >60 03/12/2008 1005   GFRNONAA 59*  02/14/2008 0249   GFRAA 60* 04/12/2011 0723   GFRAA  Value: >60        The eGFR has been calculated using the MDRD equation. This calculation has not been validated in all clinical 03/12/2008 1005   GFRAA  Value: >60        The eGFR has been calculated using the MDRD equation. This calculation has not been validated in all clinical 02/14/2008 0249   CMP     Component Value Date/Time   NA 143 02/07/2012 0805   K 4.8 02/07/2012 0805   CL 110 02/07/2012 0805   CO2 26 02/07/2012 0805   GLUCOSE 117* 02/07/2012 0805   BUN 42* 02/07/2012 0805   CREATININE 1.38* 02/07/2012 0805   CREATININE 1.26 04/12/2011 0723   CALCIUM 9.6 02/07/2012 0805   PROT 6.6 09/24/2011 1544   ALBUMIN 4.1 09/24/2011 1544   AST 18 09/24/2011 1544   ALT 12 09/24/2011 1544   ALKPHOS 57 09/24/2011 1544   BILITOT 0.5 09/24/2011 1544   GFRNONAA 52* 04/12/2011 0723   GFRAA 60* 04/12/2011 0723       Component Value Date/Time   WBC 7.3 09/24/2011 1544   WBC 8.5  06/11/2011 1203   WBC 6.9 04/12/2011 0723   HGB 13.2 09/24/2011 1544   HGB 15.2 06/11/2011 1203   HGB 13.3 04/12/2011 0723   HCT 40.3 09/24/2011 1544   HCT 44.6 06/11/2011 1203   HCT 39.2 04/12/2011 0723   MCV 101.5* 09/24/2011 1544   MCV 101.6* 06/11/2011 1203   MCV 101.3* 04/12/2011 0723    Lipid Panel     Component Value Date/Time   CHOL 159 09/17/2009 1321   TRIG 120 09/17/2009 1321   HDL 34 09/17/2009 1321   CHOLHDL 5.4 Ratio 08/01/2009 2143   VLDL 22 08/01/2009 2143   LDLCALC 101 09/17/2009 1321    ABG No results found for this basename: phart, pco2, pco2art, po2, po2art, hco3, tco2, acidbasedef, o2sat     Lab Results  Component Value Date   TSH 1.291 09/24/2011   BNP (last 3 results) No results found for this basename: PROBNP,  in the last 8760 hours Cardiac Panel (last 3 results) No results found for this basename: CKTOTAL, CKMB, TROPONINI, RELINDX,  in the last 72 hours  Iron/TIBC/Ferritin No results found for this basename: iron, tibc, ferritin     EKG Orders placed in visit on 09/26/12  . EKG 12-LEAD     Prior Assessment and Plan Problem List as of 09/26/2012   HYPERTENSION, MILD   Last Assessment & Plan   12/27/2011 Office Visit Written 12/27/2011  2:46 PM by Kathlen Brunswick, MD     Hypertension has resolved in the setting of treatment of cardiomyopathy.    Nonischemic cardiomyopathy   Last Assessment & Plan   03/28/2012 Office Visit Written 03/28/2012 12:15 PM by Kathlen Brunswick, MD     Continues to be compensated by exam, weight and symptoms.  Patient already weighs himself daily with stable values around 175 pounds at home.  He is advised to increase furosemide to 40 mg twice a day whenever weight increases to 180 and continue that dose until a weight of 175 pounds is reestablished.    DEGENERATIVE JOINT DISEASE   Obstructive sleep apnea   Last Assessment & Plan   12/27/2011 Office Visit Written 12/27/2011  2:48 PM by Kathlen Brunswick, MD     Managed and recently  evaluated by Dr. Maple Hudson, who felt that current therapy  was effective and appropriate.    DYSPNEA ON EXERTION   Last Assessment & Plan   10/25/2011 Office Visit Written 10/26/2011  7:27 PM by Kathlen Brunswick, MD     6 minute walk test in 10/2011:600 feet, normal oxygen saturation, slight increase in heart rate to a maximum in the 90s.    ICD -St.Jude   Last Assessment & Plan   11/09/2011 Office Visit Written 11/09/2011  4:13 PM by Marinus Maw, MD     His device is working normally. Will recheck in several months.    Orthostatic hypotension   Last Assessment & Plan   03/28/2012 Office Visit Written 03/28/2012 12:16 PM by Kathlen Brunswick, MD     Blood pressure continues to be quite low, but patient denies any orthostatic symptoms, lightheadedness or syncope.  He has not fallen.  We will continue to maximize doses of cardiac medication as permitted by blood pressure.    Atrial fibrillation   Last Assessment & Plan   03/28/2012 Office Visit Edited 03/29/2012 11:55 AM by Kathlen Brunswick, MD     Atrial fibrillation with controlled ventricular response persists without definite symptoms related to this arrhythmia.  Our strategy will continue to be rate control and anticoagulation.    Chronic anticoagulation   Last Assessment & Plan   03/28/2012 Office Visit Written 03/28/2012 12:18 PM by Kathlen Brunswick, MD     No evidence for occult GI blood loss.    Cardiorenal syndrome   Last Assessment & Plan   03/28/2012 Office Visit Written 03/28/2012 12:18 PM by Kathlen Brunswick, MD     Renal function remained stable with a fairly modest dose of diuretic.  Closer monitoring will be necessary if it proves necessary to increase daily furosemide dosage.        Imaging: No results found.

## 2012-09-26 NOTE — Assessment & Plan Note (Signed)
Symptoms likely secondary to cardiomyopathy. Relative hypotension may be playing a role in his sensation of weakness and fatigue. Losartan will be decreased to 50 mg per day with continuing assessment of blood pressure and symptoms.

## 2012-09-26 NOTE — Assessment & Plan Note (Addendum)
Exertional dyspnea is chronic and likely secondary to markedly impaired left ventricular systolic and diastolic function. Weakness and fatigue are probably stable, but more apparent to the patient. Orthostatic vital signs indicate no drop in blood pressure with standing and no symptoms.  We will further evaluate with a 6 minute walk test and repeat laboratory studies. CBC and TSH were normal one year ago.

## 2012-09-26 NOTE — Patient Instructions (Addendum)
Your physician recommends that you schedule a follow-up appointment in: 3 weeks  Your physician recommends that you return for lab work in: today (cmet, bnp-slips given)  Your physician has recommended you make the following change in your medication:   1) decrease Losartan 50mg  once daily

## 2012-09-26 NOTE — Assessment & Plan Note (Signed)
Atrial fibrillation persists with adequate control of heart rate at rest. A 6 minute walk test will be performed to rule out exercise-induced hypoxemia by oximetry, evaluate exercise tolerance, evaluate exertion-related symptoms and exclude a rapid ventricular response to atrial fibrillation.

## 2012-09-26 NOTE — Assessment & Plan Note (Signed)
Patient has done very well with long-term warfarin therapy without occult or apparent blood loss. We will continue to monitor FOBT and serial CBCs.

## 2012-09-27 ENCOUNTER — Encounter: Payer: Self-pay | Admitting: *Deleted

## 2012-09-27 ENCOUNTER — Encounter: Payer: Self-pay | Admitting: Cardiology

## 2012-09-29 ENCOUNTER — Ambulatory Visit (INDEPENDENT_AMBULATORY_CARE_PROVIDER_SITE_OTHER): Payer: Medicare Other | Admitting: *Deleted

## 2012-09-29 VITALS — BP 96/54 | HR 70 | Wt 177.0 lb

## 2012-09-29 DIAGNOSIS — I10 Essential (primary) hypertension: Secondary | ICD-10-CM

## 2012-09-29 NOTE — Progress Notes (Signed)
Pt presented to [facility] for BP check and 6 minute walk with rhythm strip Pt wife noted pt blood pressure is still low per recent change from Cozaar 100mg  daily to Cozaar 50mg  (half tab) once daily Pt noted BP this am 90's over 39, no other sxs of concerns noted Noted Average HR of 85 during walk, Average O2 level 90% via room air walked approximately 14-16 times up and down hall Pt noted no appointment was available for the 3 week f/u time slot, [place] going [place] beach on the 4th week time slot noted f/u apt scheduled for 11-22-12 Please advise if any further changes need to be made   [facility] A. Jovi Zavadil L.P.N.  Last OV D/C Summary and VS:  BP 88/59  Pulse 73  Ht 5\' 10"  (1.778 m)  Wt 80.287 kg (177 lb)  BMI 25.4 kg/m2;  Your physician recommends that you schedule a follow-up appointment in: 3 weeks  Your physician recommends that you return for lab work in: today (cmet, bnp-slips given)  Your physician has recommended you make the following change in your medication:  1) decrease Losartan 50mg  once daily

## 2012-09-29 NOTE — Patient Instructions (Addendum)
Your physician recommends that you schedule a follow-up appointment in: TO BE DETERMINED, WE WILL CALL YOU WITH ANY CHANGES IF NECCESSARY

## 2012-09-29 NOTE — Progress Notes (Signed)
Name: Mario Cline    DOB: 02-Dec-1928  Age: 79 y.o.  MR#: [id number]       PCP:  Carylon Perches, MD      Insurance: Payor: BLUE CROSS [employer]UE [employer] [place] MEDICARE / Plan: BLUE MEDICARE / Product Type: *No Product type* /   NO LIST PT WIFE NOTED STARTED TAKING HALF OF COZAAR 100MG  TABLETS ON WED AS ADVISED ONCE DAILY, HOWEVER PT BP IS STILL REALLY LOW, NOTED TODAY 96/54 NOTED 90'S/30'S THIS AM, WANTS TO KNOW IF ADJUSTMENT NEEDS TO BE MADE 6 MIN WALK PERFORMED VS Filed Vitals:   09/29/12 1008  BP: 96/54  Pulse: 70  Weight: 177 lb (80.287 kg)  SpO2: 95%   Weights Current Weight  09/29/12 177 lb (80.287 kg)  09/26/12 177 lb (80.287 kg)  03/28/12 182 lb 6.4 oz (82.736 kg)   Blood Pressure  BP Readings from Last 3 Encounters:  09/29/12 96/54  09/26/12 94/62  03/28/12 90/58    Allergy Review of patient's allergies indicates no known allergies.  Current Outpatient Prescriptions  Medication Sig Dispense Refill  . acetaminophen (TYLENOL) 500 MG tablet Take 500 mg by mouth every 6 (six) hours as needed. For headache       . ALDACTONE 25 MG tablet TAKE ONE TABLET DAILY.  90 each  0  . carvedilol (COREG) 12.5 MG tablet Take 1 tablet (12.5 mg total) by mouth 2 (two) times daily with a meal.  60 tablet  12  . COUMADIN 5 MG tablet TAKE AS DIRECTED.  30 tablet  3  . furosemide (LASIX) 40 MG tablet Take 1 tablet (40 mg total) by mouth daily. TAKE 2 TIMES A DAY FOR WEIGHT > 180 UNTIL REACHES 175 LB OTHERWISE TAKE ONCE DAILY  45 tablet  3  . losartan (COZAAR) 50 MG tablet Take 1 tablet (50 mg total) by mouth daily.  90 tablet  3  . Multiple Vitamins-Minerals (MULTIVITAMINS THER. W/MINERALS) TABS Take 1 tablet by mouth daily.         No current facility-administered medications for this visit.   Patient Active Problem List   Diagnosis Date Noted  . Cardiorenal syndrome 10/02/2011  . Chronic anticoagulation   . Orthostatic hypotension   . ICD -St.Jude 02/10/2011  . Obstructive sleep apnea 05/07/2010   . DYSPNEA ON EXERTION 05/07/2010  . Atrial fibrillation 04/09/2009  . HYPERTENSION, MILD 12/11/2008  . Nonischemic cardiomyopathy 12/11/2008  . DEGENERATIVE JOINT DISEASE 12/11/2008   CMP     Component Value Date/Time   NA 139 09/26/2012 1216   K 5.0 09/26/2012 1216   CL 105 09/26/2012 1216   CO2 25 09/26/2012 1216   GLUCOSE 105* 09/26/2012 1216   BUN 54* 09/26/2012 1216   CREATININE 1.70* 09/26/2012 1216   CREATININE 1.26 04/12/2011 0723   CALCIUM 9.6 09/26/2012 1216   PROT 6.6 09/26/2012 1216   ALBUMIN 4.4 09/26/2012 1216   AST 22 09/26/2012 1216   ALT 15 09/26/2012 1216   ALKPHOS 64 09/26/2012 1216   BILITOT 0.9 09/26/2012 1216   GFRNONAA 52* 04/12/2011 0723   GFRAA 60* 04/12/2011 0723   Normal CBC  Lipid Panel     Component Value Date/Time   CHOL 159 09/17/2009 1321   TRIG 120 09/17/2009 1321   HDL 34 09/17/2009 1321   CHOLHDL 5.4 Ratio 08/01/2009 2143   VLDL 22 08/01/2009 2143   LDLCALC 101 09/17/2009 1321    Lab Results  Component Value Date   TSH 1.291 09/24/2011

## 2012-09-30 ENCOUNTER — Other Ambulatory Visit: Payer: Self-pay | Admitting: Cardiology

## 2012-09-30 NOTE — Progress Notes (Signed)
Patient ID: Mario Cline, male   DOB: [birth year], 79 y.o.   MRN: [id number]  Our request was that he decrease losartan to 50 mg per day. If he has not done so, he should. As long as low blood pressure does not cause symptoms, I am not terribly concerned.  Nassau Bing, MD

## 2012-10-01 ENCOUNTER — Encounter: Payer: Self-pay | Admitting: Cardiology

## 2012-10-04 ENCOUNTER — Telehealth: Payer: Self-pay | Admitting: Cardiology

## 2012-10-04 NOTE — Telephone Encounter (Signed)
PT WIFE IS CALLING BACK AGAIN ABOUT LABS. I EXPLAINED TO HER THAT THE NURSE WILL CALL AS SOON AS SHE GETS A CHANCE, PROBABLY AFTER 4:00 WHEN WE ARE DONE SEEING PATIENTS/TMJ

## 2012-10-04 NOTE — Telephone Encounter (Signed)
Spoke to pt to advise results/instructions. Pt understood.  

## 2012-10-04 NOTE — Progress Notes (Signed)
Pt has reduced the losartan to 50mg  as advised, pt will call office if any further instructions needed

## 2012-10-04 NOTE — Telephone Encounter (Signed)
Needs to speak with nurse.  States that she is returning call also to nurse. / tgs

## 2012-10-04 NOTE — Progress Notes (Signed)
.  left message to have patient return my call.  

## 2012-10-11 ENCOUNTER — Encounter: Payer: Self-pay | Admitting: Cardiology

## 2012-10-23 ENCOUNTER — Other Ambulatory Visit: Payer: Self-pay | Admitting: Adult Health

## 2012-10-24 ENCOUNTER — Encounter: Payer: Self-pay | Admitting: Cardiology

## 2012-10-24 ENCOUNTER — Ambulatory Visit (INDEPENDENT_AMBULATORY_CARE_PROVIDER_SITE_OTHER): Payer: Medicare Other | Admitting: Cardiology

## 2012-10-24 VITALS — BP 94/61 | HR 87 | Ht 70.0 in | Wt 183.2 lb

## 2012-10-24 DIAGNOSIS — I951 Orthostatic hypotension: Secondary | ICD-10-CM

## 2012-10-24 DIAGNOSIS — R0609 Other forms of dyspnea: Secondary | ICD-10-CM

## 2012-10-24 DIAGNOSIS — Z7901 Long term (current) use of anticoagulants: Secondary | ICD-10-CM

## 2012-10-24 DIAGNOSIS — I428 Other cardiomyopathies: Secondary | ICD-10-CM

## 2012-10-24 DIAGNOSIS — I10 Essential (primary) hypertension: Secondary | ICD-10-CM

## 2012-10-24 DIAGNOSIS — R0989 Other specified symptoms and signs involving the circulatory and respiratory systems: Secondary | ICD-10-CM

## 2012-10-24 DIAGNOSIS — I4891 Unspecified atrial fibrillation: Secondary | ICD-10-CM

## 2012-10-24 DIAGNOSIS — I1310 Hypertensive heart and chronic kidney disease without heart failure, with stage 1 through stage 4 chronic kidney disease, or unspecified chronic kidney disease: Secondary | ICD-10-CM

## 2012-10-24 DIAGNOSIS — Z95810 Presence of automatic (implantable) cardiac defibrillator: Secondary | ICD-10-CM

## 2012-10-24 NOTE — Assessment & Plan Note (Signed)
Patient is doing well symptomatically despite borderline hypotension. Discomfort over the left breast could represent an adverse effect of spironolactone. Dose will be reduced to 12.5 mg per day, which also may result in some increase in blood pressure. If symptoms persist, medication may need to be held or substituted by eplerenone.

## 2012-10-24 NOTE — Assessment & Plan Note (Signed)
Patient reports no symptoms definitely attributable to atrial fibrillation. Rate control is adequate at rest and following a 6 minute walk performed in conjunction with his previous visit. No modification of therapy is required.

## 2012-10-24 NOTE — Assessment & Plan Note (Signed)
No evidence for occult GI blood loss; hemoglobin remains normal; repeat FOBT pending

## 2012-10-24 NOTE — Assessment & Plan Note (Signed)
Fairly frequent pacing noted on some of patient's EKGs and rhythm strips. Should control of congestive heart failure deteriorate, consideration could be given to an upgrade of his device to biventricular pacing.

## 2012-10-24 NOTE — Assessment & Plan Note (Signed)
No orthostatic change in blood pressure at present and no orthostatic symptoms. Further adjustment of medications will be undertaken as necessary.

## 2012-10-24 NOTE — Assessment & Plan Note (Signed)
Stable prerenal azotemia.

## 2012-10-24 NOTE — Assessment & Plan Note (Signed)
Symptoms improved with modification of medical regime.

## 2012-10-24 NOTE — Progress Notes (Deleted)
Name: Mario Cline    DOB: 08-13-1928  Age: 79 y.o.  MR#: [id number]       PCP:  Carylon Perches, MD      Insurance: Payor: BLUE CROSS [employer]UE [employer] [place] MEDICARE / Plan: BLUE MEDICARE / Product Type: *No Product type* /   CC:   No chief complaint on file. bottles Pt brought in BP recordings   VS Filed Vitals:   10/24/12 1316  BP: 94/68  Pulse: 87  Height: 5\' 10"  (1.778 m)  Weight: 183 lb 4 oz (83.122 kg)    Weights Current Weight  10/24/12 183 lb 4 oz (83.122 kg)  09/29/12 177 lb (80.287 kg)  09/26/12 177 lb (80.287 kg)    Blood Pressure  BP Readings from Last 3 Encounters:  10/24/12 94/68  09/29/12 96/54  09/26/12 94/62     Admit date:  (Not on file) Last encounter with RMR:  10/04/2012   Allergy Review of patient's allergies indicates no known allergies.  Current Outpatient Prescriptions  Medication Sig Dispense Refill  . acetaminophen (TYLENOL) 500 MG tablet Take 500 mg by mouth every 6 (six) hours as needed. For headache       . carvedilol (COREG) 12.5 MG tablet Take 1 tablet (12.5 mg total) by mouth 2 (two) times daily with a meal.  60 tablet  12  . COUMADIN 5 MG tablet TAKE AS DIRECTED.  30 tablet  3  . furosemide (LASIX) 40 MG tablet TAKE ONE TAB BY MOUTH ONCE DAILY. IF WEIGHT GREATER THAN 180 LBS THENTAKE 2 TABS DAILY UNTIL WEIGHT REACHES 175 LBS.  45 tablet  3  . losartan (COZAAR) 50 MG tablet Take 1 tablet (50 mg total) by mouth daily.  90 tablet  3  . Multiple Vitamins-Minerals (MULTIVITAMINS THER. W/MINERALS) TABS Take 1 tablet by mouth daily.        Marland Kitchen spironolactone (ALDACTONE) 25 MG tablet TAKE ONE TABLET DAILY.  90 tablet  2   No current facility-administered medications for this visit.    Discontinued Meds:   There are no discontinued medications.  Patient Active Problem List   Diagnosis Date Noted  . Cardiorenal syndrome 10/02/2011  . Chronic anticoagulation   . Orthostatic hypotension   . ICD -St.Jude 02/10/2011  . Obstructive sleep apnea 05/07/2010   . DYSPNEA ON EXERTION 05/07/2010  . Atrial fibrillation 04/09/2009  . HYPERTENSION, MILD 12/11/2008  . Nonischemic cardiomyopathy 12/11/2008  . DEGENERATIVE JOINT DISEASE 12/11/2008    LABS    Component Value Date/Time   NA 139 09/26/2012 1216   NA 143 02/07/2012 0805   NA 143 12/06/2011 0832   K 5.0 09/26/2012 1216   K 4.8 02/07/2012 0805   K 4.5 12/06/2011 0832   CL 105 09/26/2012 1216   CL 110 02/07/2012 0805   CL 110 12/06/2011 0832   CO2 25 09/26/2012 1216   CO2 26 02/07/2012 0805   CO2 26 12/06/2011 0832   GLUCOSE 105* 09/26/2012 1216   GLUCOSE 117* 02/07/2012 0805   GLUCOSE 105* 12/06/2011 0832   BUN 54* 09/26/2012 1216   BUN 42* 02/07/2012 0805   BUN 31* 12/06/2011 0832   CREATININE 1.70* 09/26/2012 1216   CREATININE 1.38* 02/07/2012 0805   CREATININE 1.41* 12/06/2011 0832   CREATININE 1.26 04/12/2011 0723   CREATININE 1.37 03/30/2010 1319   CREATININE 1.45 03/26/2010 1954   CALCIUM 9.6 09/26/2012 1216   CALCIUM 9.6 02/07/2012 0805   CALCIUM 9.3 12/06/2011 0832   GFRNONAA 52* 04/12/2011 0723  GFRNONAA >60 03/12/2008 1005   GFRNONAA 59* 02/14/2008 0249   GFRAA 60* 04/12/2011 0723   GFRAA  Value: >60        The eGFR has been calculated using the MDRD equation. This calculation has not been validated in all clinical 03/12/2008 1005   GFRAA  Value: >60        The eGFR has been calculated using the MDRD equation. This calculation has not been validated in all clinical 02/14/2008 0249   CMP     Component Value Date/Time   NA 139 09/26/2012 1216   K 5.0 09/26/2012 1216   CL 105 09/26/2012 1216   CO2 25 09/26/2012 1216   GLUCOSE 105* 09/26/2012 1216   BUN 54* 09/26/2012 1216   CREATININE 1.70* 09/26/2012 1216   CREATININE 1.26 04/12/2011 0723   CALCIUM 9.6 09/26/2012 1216   PROT 6.6 09/26/2012 1216   ALBUMIN 4.4 09/26/2012 1216   AST 22 09/26/2012 1216   ALT 15 09/26/2012 1216   ALKPHOS 64 09/26/2012 1216   BILITOT 0.9 09/26/2012 1216   GFRNONAA 52* 04/12/2011 0723   GFRAA 60* 04/12/2011 0723        Component Value Date/Time   WBC 7.3 09/24/2011 1544   WBC 8.5 06/11/2011 1203   WBC 6.9 04/12/2011 0723   HGB 13.2 09/24/2011 1544   HGB 15.2 06/11/2011 1203   HGB 13.3 04/12/2011 0723   HCT 40.3 09/24/2011 1544   HCT 44.6 06/11/2011 1203   HCT 39.2 04/12/2011 0723   MCV 101.5* 09/24/2011 1544   MCV 101.6* 06/11/2011 1203   MCV 101.3* 04/12/2011 0723    Lipid Panel     Component Value Date/Time   CHOL 159 09/17/2009 1321   TRIG 120 09/17/2009 1321   HDL 34 09/17/2009 1321   CHOLHDL 5.4 Ratio 08/01/2009 2143   VLDL 22 08/01/2009 2143   LDLCALC 101 09/17/2009 1321    ABG No results found for this basename: phart, pco2, pco2art, po2, po2art, hco3, tco2, acidbasedef, o2sat     Lab Results  Component Value Date   TSH 1.291 09/24/2011   BNP (last 3 results) No results found for this basename: PROBNP,  in the last 8760 hours Cardiac Panel (last 3 results) No results found for this basename: CKTOTAL, CKMB, TROPONINI, RELINDX,  in the last 72 hours  Iron/TIBC/Ferritin No results found for this basename: iron, tibc, ferritin     EKG Orders placed in visit on 09/26/12  . EKG 12-LEAD     Prior Assessment and Plan Problem List as of 10/24/2012   HYPERTENSION, MILD   Last Assessment & Plan   12/27/2011 Office Visit Written 12/27/2011  2:46 PM by Kathlen Brunswick, MD     Hypertension has resolved in the setting of treatment of cardiomyopathy.    Nonischemic cardiomyopathy   Last Assessment & Plan   09/26/2012 Office Visit Edited 09/26/2012 12:34 PM by Kathlen Brunswick, MD     Exertional dyspnea is chronic and likely secondary to markedly impaired left ventricular systolic and diastolic function. Weakness and fatigue are probably stable, but more apparent to the patient. Orthostatic vital signs indicate no drop in blood pressure with standing and no symptoms.  We will further evaluate with a 6 minute walk test and repeat laboratory studies. CBC and TSH were normal one year ago.     DEGENERATIVE JOINT DISEASE   Obstructive sleep apnea   Last Assessment & Plan   12/27/2011 Office Visit Written 12/27/2011  2:48 PM by  Kathlen Brunswick, MD     Managed and recently evaluated by Dr. Maple Hudson, who felt that current therapy was effective and appropriate.    DYSPNEA ON EXERTION   Last Assessment & Plan   09/26/2012 Office Visit Written 09/26/2012 12:28 PM by Kathlen Brunswick, MD     Symptoms likely secondary to cardiomyopathy. Relative hypotension may be playing a role in his sensation of weakness and fatigue. Losartan will be decreased to 50 mg per day with continuing assessment of blood pressure and symptoms.    ICD -St.Jude   Last Assessment & Plan   11/09/2011 Office Visit Written 11/09/2011  4:13 PM by Marinus Maw, MD     His device is working normally. Will recheck in several months.    Orthostatic hypotension   Last Assessment & Plan   09/26/2012 Office Visit Edited 09/27/2012 10:20 PM by Kathlen Brunswick, MD     Blood pressures remain on the low side, but without significant orthostatic change.    Atrial fibrillation   Last Assessment & Plan   09/26/2012 Office Visit Written 09/26/2012 12:14 PM by Kathlen Brunswick, MD     Atrial fibrillation persists with adequate control of heart rate at rest. A 6 minute walk test will be performed to rule out exercise-induced hypoxemia by oximetry, evaluate exercise tolerance, evaluate exertion-related symptoms and exclude a rapid ventricular response to atrial fibrillation.    Chronic anticoagulation   Last Assessment & Plan   09/26/2012 Office Visit Written 09/26/2012 12:27 PM by Kathlen Brunswick, MD     Patient has done very well with long-term warfarin therapy without occult or apparent blood loss. We will continue to monitor FOBT and serial CBCs.    Cardiorenal syndrome   Last Assessment & Plan   03/28/2012 Office Visit Written 03/28/2012 12:18 PM by Kathlen Brunswick, MD     Renal function remained stable with a fairly modest  dose of diuretic.  Closer monitoring will be necessary if it proves necessary to increase daily furosemide dosage.        Imaging: No results found.

## 2012-10-24 NOTE — Patient Instructions (Addendum)
Your physician recommends that you schedule a follow-up appointment in: ONE MONTH  Your physician recommends that you return for lab work in: TODAY (SLIPS GIVEN FOR BMET)  Your physician has recommended you make the following change in your medication:   1) DECREASE SPIRONOLACTONE TO 12.5MG  ONCE DAILY  Your Physician recommends that you complete the Hemoccult package given to you today, instructions are enclosed in your packet. Once completed please return to this office.

## 2012-10-24 NOTE — Progress Notes (Signed)
Patient ID: Mario Cline, male   DOB: 1929-02-19, 79 y.o.   MRN: [id number]  HPI: Schedule return visit for continued assessment and treatment of nonischemic cardiomyopathy.  Sense of well being has improved since last visit with resolution of lightheadedness and improved exercise tolerance; nonetheless, he continues to note class III dyspnea on exertion and fatigue. He also notes tenderness in the region of the left areola.  He returns with an extensive list of blood pressures with systolics typically in the 90s and high 80s. Occasional values in the 70s are recorded. Daily weights have been maintained in a narrowing range: 171-174 pounds.  Current Outpatient Prescriptions  Medication Sig Dispense Refill  . acetaminophen (TYLENOL) 500 MG tablet Take 500 mg by mouth every 6 (six) hours as needed. For headache       . carvedilol (COREG) 12.5 MG tablet Take 1 tablet (12.5 mg total) by mouth 2 (two) times daily with a meal.  60 tablet  12  . COUMADIN 5 MG tablet TAKE AS DIRECTED.  30 tablet  3  . furosemide (LASIX) 40 MG tablet TAKE ONE TAB BY MOUTH ONCE DAILY. IF WEIGHT GREATER THAN 180 LBS THENTAKE 2 TABS DAILY UNTIL WEIGHT REACHES 175 LBS.  45 tablet  3  . losartan (COZAAR) 50 MG tablet Take 1 tablet (50 mg total) by mouth daily.  90 tablet  3  . Multiple Vitamins-Minerals (MULTIVITAMINS THER. W/MINERALS) TABS Take 1 tablet by mouth daily.        Marland Kitchen spironolactone (ALDACTONE) 25 MG tablet        No current facility-administered medications for this visit.   No Known Allergies   Past medical history, social history, and family history reviewed and updated.  ROS: Denies orthopnea, PND, weight gain or dietary indiscretion. All other systems reviewed and are negative.  PHYSICAL EXAM: BP 94/61  Pulse 87  Ht 5\' 10"  (1.778 m)  Wt 83.122 kg (183 lb 4 oz)  BMI 26.29 kg/m2;  Body mass index is 26.29 kg/(m^2). General-Well developed; no acute distress Body habitus-proportionate weight and  height Neck-mild JVD; no carotid bruits Lungs-clear lung fields; resonant to percussion Cardiovascular-normal PMI; normal S1 and S2; irregular rhythm Abdomen-normal bowel sounds; soft and non-tender without masses or organomegaly Musculoskeletal-No deformities, no cyanosis or clubbing Neurologic-Normal cranial nerves; symmetric strength and tone Skin-Warm, no significant lesions Extremities-distal pulses intact; 1+ edema  Rhythm Strip: Atrial fibrillation; ventricular rate of 85 bpm; occasional PVC or aberrant conduction.  Mario Bing, MD 10/24/2012  6:20 PM  ASSESSMENT AND PLAN

## 2012-10-26 ENCOUNTER — Other Ambulatory Visit: Payer: Self-pay | Admitting: Cardiology

## 2012-10-26 LAB — BASIC METABOLIC PANEL
BUN: 42 mg/dL — ABNORMAL HIGH (ref 6–23)
Glucose, Bld: 116 mg/dL — ABNORMAL HIGH (ref 70–99)
Potassium: 5 mEq/L (ref 3.5–5.3)

## 2012-10-27 ENCOUNTER — Encounter: Payer: Self-pay | Admitting: Cardiology

## 2012-10-27 ENCOUNTER — Encounter: Payer: Self-pay | Admitting: *Deleted

## 2012-11-08 ENCOUNTER — Encounter: Payer: Self-pay | Admitting: Internal Medicine

## 2012-11-08 ENCOUNTER — Ambulatory Visit (INDEPENDENT_AMBULATORY_CARE_PROVIDER_SITE_OTHER): Payer: Medicare Other | Admitting: Internal Medicine

## 2012-11-08 VITALS — BP 88/54 | HR 78 | Ht 70.0 in | Wt 189.0 lb

## 2012-11-08 DIAGNOSIS — I5022 Chronic systolic (congestive) heart failure: Secondary | ICD-10-CM

## 2012-11-08 DIAGNOSIS — I428 Other cardiomyopathies: Secondary | ICD-10-CM

## 2012-11-08 DIAGNOSIS — Z95810 Presence of automatic (implantable) cardiac defibrillator: Secondary | ICD-10-CM

## 2012-11-08 DIAGNOSIS — I4891 Unspecified atrial fibrillation: Secondary | ICD-10-CM

## 2012-11-08 LAB — ICD DEVICE OBSERVATION
ATRIAL PACING ICD: 0 pct
BAMS-0001: 150 {beats}/min
BAMS-0003: 70 {beats}/min
BRDY-0003RV: 100 {beats}/min
BRDY-0004RV: 110 {beats}/min
DEV-0020ICD: NEGATIVE
DEVICE MODEL ICD: 499949
MODE SWITCH EPISODES: 5
PACEART VT: 0
RV LEAD AMPLITUDE: 7.2 mv
RV LEAD THRESHOLD: 0.5 V
TOT-0006: 20090227000000
TZAT-0001SLOWVT: 1
TZAT-0004FASTVT: 8
TZAT-0004SLOWVT: 8
TZAT-0012FASTVT: 200 ms
TZAT-0012SLOWVT: 200 ms
TZAT-0013FASTVT: 1
TZAT-0019FASTVT: 7.5 V
TZAT-0019SLOWVT: 7.5 V
TZAT-0020FASTVT: 1 ms
TZON-0004FASTVT: 24
TZON-0004SLOWVT: 35
TZON-0005FASTVT: 6
TZON-0010SLOWVT: 80 ms
TZST-0001FASTVT: 3
TZST-0001FASTVT: 5
TZST-0001SLOWVT: 3
TZST-0003FASTVT: 15 J
TZST-0003FASTVT: 25 J
TZST-0003FASTVT: 36 J
TZST-0003SLOWVT: 15 J
TZST-0003SLOWVT: 36 J
VENTRICULAR PACING ICD: 17 pct

## 2012-11-08 NOTE — Assessment & Plan Note (Signed)
His device is working normally. We'll plan to recheck in several months. 

## 2012-11-08 NOTE — Patient Instructions (Addendum)
Your physician recommends that you schedule a follow-up appointment with Dr Ladona Ridgel in  1 year  You will receive a reminder letter in the mail in about 10 months reminding you to call and schedule your appointment. If you don't receive this letter, please contact our office.  Device Check on 02-12-13

## 2012-11-08 NOTE — Assessment & Plan Note (Signed)
His symptoms are class II. He is quite sedentary. I've recommended increasing his physical activity.

## 2012-11-08 NOTE — Progress Notes (Signed)
HPI Mr. Menzer returns today for followup. He is a very pleasant 79 year old man with history of chronic systolic heart failure, a nonischemic cardiomyopathy, status post ICD implantation. His ejection fraction is 25%. He denies chest pain or shortness of breath. He has with strenuous activity and has become sedentary. In addition, he admits to dietary indiscretion with sodium. He has received no ICD shocks. No Known Allergies   Current Outpatient Prescriptions  Medication Sig Dispense Refill  . acetaminophen (TYLENOL) 500 MG tablet Take 500 mg by mouth every 6 (six) hours as needed. For headache       . carvedilol (COREG) 12.5 MG tablet TAKE 1 TABLET BY MOUTH TWICE DAILY WITH MEALS.  60 tablet  3  . COUMADIN 5 MG tablet TAKE AS DIRECTED.  30 tablet  3  . furosemide (LASIX) 40 MG tablet TAKE ONE TAB BY MOUTH ONCE DAILY. IF WEIGHT GREATER THAN 180 LBS THENTAKE 2 TABS DAILY UNTIL WEIGHT REACHES 175 LBS.  45 tablet  3  . losartan (COZAAR) 50 MG tablet Take 1 tablet (50 mg total) by mouth daily.  90 tablet  3  . Multiple Vitamins-Minerals (MULTIVITAMINS THER. W/MINERALS) TABS Take 1 tablet by mouth daily.        Marland Kitchen spironolactone (ALDACTONE) 25 MG tablet Take 12.5 mg by mouth daily.        No current facility-administered medications for this visit.     Past Medical History  Diagnosis Date  . Nonischemic cardiomyopathy     Cath in 1997-nonobstructive disease with normal EF; h/o CHF; EF-45% in '03, 25% in '08, 25-30% in '11;   . Hypertension   . Orthostatic hypotension   . Obstructive sleep apnea 2002    CPAP prescribed  . DJD (degenerative joint disease)   . AICD (automatic cardioverter/defibrillator) present 06/2007    AICD/dual-chamber pacemaker - (St. Jude)- 2/09;   . Atrial fibrillation 04/2009    Rare brief episodes by ICD interrogation until 03/2011->persistent  . Chronic anticoagulation     ROS:   All systems reviewed and negative except as noted in the HPI.   Past Surgical  History  Procedure Laterality Date  . Tonsillectomy    . Pacemaker insertion    . Cholecystectomy    . Posterior laminectomy / decompression lumbar spine  1985    +fusion; Dr. Hope Pigeon  . Cardiac defibrillator placement  06/2007     Family History  Problem Relation Age of Onset  . Heart failure Mother   . Heart attack Father   . Cancer Brother     x2     History   Social History  . Marital Status: Married    Spouse Name: N/A    Number of Children: 2  . Years of Education: N/A   Occupational History  . Retired     Garment/textile technologist   Social History Main Topics  . Smoking status: Never Smoker   . Smokeless tobacco: Never Used  . Alcohol Use: 0.5 oz/week    1 drink(s) per week  . Drug Use: No  . Sexually Active: No   Other Topics Concern  . Not on file   Social History Narrative  . No narrative on file     BP 88/54  Pulse 78  Ht 5\' 10"  (1.778 m)  Wt 189 lb (85.73 kg)  BMI 27.12 kg/m2  Physical Exam:  Well appearing 79 year old man,NAD HEENT: Unremarkable Neck:  7 cm JVD, no thyromegally Lungs:  Clear with no wheezes,  rales, or rhonchi. HEART:  Regular rate rhythm, no murmurs, no rubs, no clicks Abd:  soft, obese, positive bowel sounds, no organomegally, no rebound, no guarding Ext:  2 plus pulses, trace peripheral edema, no cyanosis, no clubbing Skin:  No rashes no nodules Neuro:  CN II through XII intact, motor grossly intact   DEVICE  Normal device function.  See PaceArt for details.   Assess/Plan:

## 2012-11-08 NOTE — Assessment & Plan Note (Signed)
He is now chronically in atrial fibrillation. He will continue his anticoagulation. His ventricular rate is well controlled.

## 2012-11-11 ENCOUNTER — Other Ambulatory Visit: Payer: Self-pay | Admitting: Cardiology

## 2012-11-13 ENCOUNTER — Ambulatory Visit (INDEPENDENT_AMBULATORY_CARE_PROVIDER_SITE_OTHER): Payer: Medicare Other | Admitting: *Deleted

## 2012-11-13 DIAGNOSIS — Z7901 Long term (current) use of anticoagulants: Secondary | ICD-10-CM

## 2012-11-13 NOTE — Telephone Encounter (Signed)
Medication sent via escribe for furosemide (LASIX) 40 MG tablet.  

## 2012-11-17 DIAGNOSIS — Z7901 Long term (current) use of anticoagulants: Secondary | ICD-10-CM

## 2012-11-17 LAB — POC HEMOCCULT BLD/STL (HOME/3-CARD/SCREEN)
Card #3 Fecal Occult Blood, POC: NEGATIVE
Fecal Occult Blood, POC: NEGATIVE

## 2012-11-21 ENCOUNTER — Encounter: Payer: Self-pay | Admitting: *Deleted

## 2012-11-21 ENCOUNTER — Encounter: Payer: Self-pay | Admitting: Cardiology

## 2012-11-21 ENCOUNTER — Ambulatory Visit (INDEPENDENT_AMBULATORY_CARE_PROVIDER_SITE_OTHER): Payer: Medicare Other | Admitting: Cardiology

## 2012-11-21 ENCOUNTER — Ambulatory Visit (HOSPITAL_COMMUNITY)
Admission: RE | Admit: 2012-11-21 | Discharge: 2012-11-21 | Disposition: A | Discharge status: Home / Self Care | Payer: Medicare Other | Source: Ambulatory Visit | Attending: Cardiology | Admitting: Cardiology

## 2012-11-21 VITALS — BP 94/60 | HR 72 | Ht 68.0 in | Wt 186.8 lb

## 2012-11-21 DIAGNOSIS — I4891 Unspecified atrial fibrillation: Secondary | ICD-10-CM

## 2012-11-21 DIAGNOSIS — R059 Cough, unspecified: Secondary | ICD-10-CM | POA: Insufficient documentation

## 2012-11-21 DIAGNOSIS — Z7901 Long term (current) use of anticoagulants: Secondary | ICD-10-CM

## 2012-11-21 DIAGNOSIS — R05 Cough: Secondary | ICD-10-CM | POA: Insufficient documentation

## 2012-11-21 DIAGNOSIS — I5040 Unspecified combined systolic (congestive) and diastolic (congestive) heart failure: Secondary | ICD-10-CM

## 2012-11-21 DIAGNOSIS — I1310 Hypertensive heart and chronic kidney disease without heart failure, with stage 1 through stage 4 chronic kidney disease, or unspecified chronic kidney disease: Secondary | ICD-10-CM

## 2012-11-21 DIAGNOSIS — Z95810 Presence of automatic (implantable) cardiac defibrillator: Secondary | ICD-10-CM

## 2012-11-21 DIAGNOSIS — I5022 Chronic systolic (congestive) heart failure: Secondary | ICD-10-CM

## 2012-11-21 DIAGNOSIS — I10 Essential (primary) hypertension: Secondary | ICD-10-CM

## 2012-11-21 NOTE — Patient Instructions (Addendum)
Your physician recommends that you schedule a follow-up appointment in: 4 MONTHS WITH Dr. Purvis Sheffield   A chest x-ray takes a picture of the organs and structures inside the chest, including the heart, lungs, and blood vessels. This test can show several things, including, whether the heart is enlarges; whether fluid is building up in the lungs; and whether pacemaker / defibrillator leads are still in place.  Your physician recommends that you return for lab work in: TODAY (SLIPS GIVEN FOR CMET,CBC,BNP LEVEL

## 2012-11-21 NOTE — Progress Notes (Deleted)
Name: Mario Cline    DOB: 1928/11/23  Age: 79 y.o.  MR#: [id number]       PCP:  Carylon Perches, MD      Insurance: Payor: [employer] / Plan: BLUE MEDICARE / Product Type: *No Product type* /   CC:   No chief complaint on file. BOTTLES  VS Filed Vitals:   11/21/12 1530  BP: 94/60  Pulse: 72  Height: 5\' 8"  (1.727 m)  Weight: 186 lb 12 oz (84.709 kg)    Weights Current Weight  11/21/12 186 lb 12 oz (84.709 kg)  11/08/12 189 lb (85.73 kg)  10/24/12 183 lb 4 oz (83.122 kg)    Blood Pressure  BP Readings from Last 3 Encounters:  11/21/12 94/60  11/08/12 88/54  10/24/12 94/61     Admit date:  (Not on file) Last encounter with RMR:  11/11/2012   Allergy Review of patient's allergies indicates no known allergies.  Current Outpatient Prescriptions  Medication Sig Dispense Refill  . acetaminophen (TYLENOL) 500 MG tablet Take 500 mg by mouth every 6 (six) hours as needed. For headache       . carvedilol (COREG) 12.5 MG tablet TAKE 1 TABLET BY MOUTH TWICE DAILY WITH MEALS.  60 tablet  3  . COUMADIN 5 MG tablet TAKE AS DIRECTED.  30 tablet  3  . furosemide (LASIX) 40 MG tablet TAKE ONE TAB BY MOUTH ONCE DAILY. IF WEIGHT GREATER THAN 180 LBS THENTAKE 2 TABS DAILY UNTIL WEIGHT REACHES 175 LBS.  45 tablet  3  . losartan (COZAAR) 50 MG tablet Take 1 tablet (50 mg total) by mouth daily.  90 tablet  3  . Multiple Vitamins-Minerals (MULTIVITAMINS THER. W/MINERALS) TABS Take 1 tablet by mouth daily.        Marland Kitchen spironolactone (ALDACTONE) 25 MG tablet Take 12.5 mg by mouth daily.        No current facility-administered medications for this visit.    Discontinued Meds:   There are no discontinued medications.  Patient Active Problem List   Diagnosis Date Noted  . Chronic systolic heart failure 11/08/2012  . Cardiorenal syndrome 10/02/2011  . Chronic anticoagulation   . Orthostatic hypotension   . Automatic implantable cardioverter-defibrillator in situ 02/10/2011  .  Obstructive sleep apnea 05/07/2010  . DYSPNEA ON EXERTION 05/07/2010  . Atrial fibrillation 04/09/2009  . HYPERTENSION, MILD 12/11/2008  . Nonischemic cardiomyopathy 12/11/2008    LABS    Component Value Date/Time   NA 143 10/24/2012 1402   NA 139 09/26/2012 1216   NA 143 02/07/2012 0805   K 5.0 10/24/2012 1402   K 5.0 09/26/2012 1216   K 4.8 02/07/2012 0805   CL 108 10/24/2012 1402   CL 105 09/26/2012 1216   CL 110 02/07/2012 0805   CO2 24 10/24/2012 1402   CO2 25 09/26/2012 1216   CO2 26 02/07/2012 0805   GLUCOSE 116* 10/24/2012 1402   GLUCOSE 105* 09/26/2012 1216   GLUCOSE 117* 02/07/2012 0805   BUN 42* 10/24/2012 1402   BUN 54* 09/26/2012 1216   BUN 42* 02/07/2012 0805   CREATININE 1.62* 10/24/2012 1402   CREATININE 1.70* 09/26/2012 1216   CREATININE 1.38* 02/07/2012 0805   CREATININE 1.26 04/12/2011 0723   CREATININE 1.37 03/30/2010 1319   CREATININE 1.45 03/26/2010 1954   CALCIUM 9.6 10/24/2012 1402   CALCIUM 9.6 09/26/2012 1216   CALCIUM 9.6 02/07/2012 0805   GFRNONAA 52* 04/12/2011 0723   GFRNONAA >60 03/12/2008  1005   GFRNONAA 59* 02/14/2008 0249   GFRAA 60* 04/12/2011 0723   GFRAA  Value: >60        The eGFR has been calculated using the MDRD equation. This calculation has not been validated in all clinical 03/12/2008 1005   GFRAA  Value: >60        The eGFR has been calculated using the MDRD equation. This calculation has not been validated in all clinical 02/14/2008 0249   CMP     Component Value Date/Time   NA 143 10/24/2012 1402   K 5.0 10/24/2012 1402   CL 108 10/24/2012 1402   CO2 24 10/24/2012 1402   GLUCOSE 116* 10/24/2012 1402   BUN 42* 10/24/2012 1402   CREATININE 1.62* 10/24/2012 1402   CREATININE 1.26 04/12/2011 0723   CALCIUM 9.6 10/24/2012 1402   PROT 6.6 09/26/2012 1216   ALBUMIN 4.4 09/26/2012 1216   AST 22 09/26/2012 1216   ALT 15 09/26/2012 1216   ALKPHOS 64 09/26/2012 1216   BILITOT 0.9 09/26/2012 1216   GFRNONAA 52* 04/12/2011 0723   GFRAA 60* 04/12/2011 0723        Component Value Date/Time   WBC 7.3 09/24/2011 1544   WBC 8.5 06/11/2011 1203   WBC 6.9 04/12/2011 0723   HGB 13.2 09/24/2011 1544   HGB 15.2 06/11/2011 1203   HGB 13.3 04/12/2011 0723   HCT 40.3 09/24/2011 1544   HCT 44.6 06/11/2011 1203   HCT 39.2 04/12/2011 0723   MCV 101.5* 09/24/2011 1544   MCV 101.6* 06/11/2011 1203   MCV 101.3* 04/12/2011 0723    Lipid Panel     Component Value Date/Time   CHOL 159 09/17/2009 1321   TRIG 120 09/17/2009 1321   HDL 34 09/17/2009 1321   CHOLHDL 5.4 Ratio 08/01/2009 2143   VLDL 22 08/01/2009 2143   LDLCALC 101 09/17/2009 1321    ABG No results found for this basename: phart, pco2, pco2art, po2, po2art, hco3, tco2, acidbasedef, o2sat     Lab Results  Component Value Date   TSH 1.291 09/24/2011   BNP (last 3 results) No results found for this basename: PROBNP,  in the last 8760 hours Cardiac Panel (last 3 results) No results found for this basename: CKTOTAL, CKMB, TROPONINI, RELINDX,  in the last 72 hours  Iron/TIBC/Ferritin No results found for this basename: iron, tibc, ferritin     EKG Orders placed in visit on 11/08/12  . EKG 12-LEAD     Prior Assessment and Plan Problem List as of 11/21/2012   HYPERTENSION, MILD   Last Assessment & Plan   12/27/2011 Office Visit Written 12/27/2011  2:46 PM by Kathlen Brunswick, MD     Hypertension has resolved in the setting of treatment of cardiomyopathy.    Nonischemic cardiomyopathy   Last Assessment & Plan   10/24/2012 Office Visit Written 10/24/2012  6:34 PM by Kathlen Brunswick, MD     Patient is doing well symptomatically despite borderline hypotension. Discomfort over the left breast could represent an adverse effect of spironolactone. Dose will be reduced to 12.5 mg per day, which also may result in some increase in blood pressure. If symptoms persist, medication may need to be held or substituted by eplerenone.    Obstructive sleep apnea   Last Assessment & Plan   12/27/2011 Office Visit Written  12/27/2011  2:48 PM by Kathlen Brunswick, MD     Managed and recently evaluated by Dr. Maple Hudson, who felt that current therapy  was effective and appropriate.    DYSPNEA ON EXERTION   Last Assessment & Plan   10/24/2012 Office Visit Written 10/24/2012  6:32 PM by Kathlen Brunswick, MD     Symptoms improved with modification of medical regime.    Automatic implantable cardioverter-defibrillator in situ   Last Assessment & Plan   11/08/2012 Office Visit Written 11/08/2012  9:26 AM by Marinus Maw, MD     His device is working normally. We'll plan to recheck in several months.    Orthostatic hypotension   Last Assessment & Plan   10/24/2012 Office Visit Written 10/24/2012  6:35 PM by Kathlen Brunswick, MD     No orthostatic change in blood pressure at present and no orthostatic symptoms. Further adjustment of medications will be undertaken as necessary.    Atrial fibrillation   Last Assessment & Plan   11/08/2012 Office Visit Written 11/08/2012  9:26 AM by Marinus Maw, MD     He is now chronically in atrial fibrillation. He will continue his anticoagulation. His ventricular rate is well controlled.    Chronic anticoagulation   Last Assessment & Plan   10/24/2012 Office Visit Written 10/24/2012  6:32 PM by Kathlen Brunswick, MD     No evidence for occult GI blood loss; hemoglobin remains normal; repeat FOBT pending    Cardiorenal syndrome   Last Assessment & Plan   10/24/2012 Office Visit Written 10/24/2012  6:31 PM by Kathlen Brunswick, MD     Stable prerenal azotemia.    Chronic systolic heart failure   Last Assessment & Plan   11/08/2012 Office Visit Written 11/08/2012  9:27 AM by Marinus Maw, MD     His symptoms are class II. He is quite sedentary. I've recommended increasing his physical activity.        Imaging: No results found.

## 2012-11-22 ENCOUNTER — Ambulatory Visit: Payer: Self-pay | Admitting: Cardiology

## 2012-11-22 LAB — CBC
HCT: 39.6 % (ref 39.0–52.0)
MCHC: 33.8 g/dL (ref 30.0–36.0)
MCV: 96.8 fL (ref 78.0–100.0)
Platelets: 156 10*3/uL (ref 150–400)
RDW: 14.7 % (ref 11.5–15.5)

## 2012-11-23 LAB — COMPREHENSIVE METABOLIC PANEL
ALT: 18 U/L (ref 0–53)
AST: 23 U/L (ref 0–37)
CO2: 27 mEq/L (ref 19–32)
Calcium: 9.7 mg/dL (ref 8.4–10.5)
Chloride: 107 mEq/L (ref 96–112)
Creat: 1.75 mg/dL — ABNORMAL HIGH (ref 0.50–1.35)
Sodium: 142 mEq/L (ref 135–145)
Total Bilirubin: 1.2 mg/dL (ref 0.3–1.2)
Total Protein: 6.4 g/dL (ref 6.0–8.3)

## 2012-11-24 ENCOUNTER — Ambulatory Visit: Payer: Medicare Other | Admitting: Adult Health

## 2012-11-25 ENCOUNTER — Encounter: Payer: Self-pay | Admitting: Cardiology

## 2012-11-25 DIAGNOSIS — Z9289 Personal history of other medical treatment: Secondary | ICD-10-CM | POA: Insufficient documentation

## 2012-11-27 ENCOUNTER — Encounter: Payer: Self-pay | Admitting: *Deleted

## 2012-12-04 NOTE — Assessment & Plan Note (Signed)
Congestive heart failure is compensated, but exercise tolerance is moderately impaired by a 6 minute walk test. Increased activity as tolerated is recommended.

## 2012-12-04 NOTE — Assessment & Plan Note (Signed)
Patient's device has never discharged. He is pacing a substantial fraction of the time and might benefit from upgrade to a device and lead system capable of providing biventricular pacing. This will be discussed with Dr. Ladona Ridgel.

## 2012-12-04 NOTE — Assessment & Plan Note (Signed)
Hypertensive blood pressure readings have not been recorded for some time. Patient tends towards hypotension with current therapy.

## 2012-12-04 NOTE — Progress Notes (Signed)
Patient ID: Mario Cline, male   DOB: Oct 30, 1928, 79 y.o.   MRN: [id number]  HPI: Scheduled return visit for continued assessment and treatment of nonischemic cardiomyopathy. Despite markedly depressed LV systolic function, patient has done fairly well with chronic class 2-3 symptoms.  Current Outpatient Prescriptions  Medication Sig Dispense Refill  . acetaminophen (TYLENOL) 500 MG tablet Take 500 mg by mouth every 6 (six) hours as needed. For headache       . carvedilol (COREG) 12.5 MG tablet TAKE 1 TABLET BY MOUTH TWICE DAILY WITH MEALS.  60 tablet  3  . COUMADIN 5 MG tablet TAKE AS DIRECTED.  30 tablet  3  . furosemide (LASIX) 40 MG tablet TAKE ONE TAB BY MOUTH ONCE DAILY. IF WEIGHT GREATER THAN 180 LBS THENTAKE 2 TABS DAILY UNTIL WEIGHT REACHES 175 LBS.  45 tablet  3  . losartan (COZAAR) 50 MG tablet Take 1 tablet (50 mg total) by mouth daily.  90 tablet  3  . Multiple Vitamins-Minerals (MULTIVITAMINS THER. W/MINERALS) TABS Take 1 tablet by mouth daily.        Marland Kitchen spironolactone (ALDACTONE) 25 MG tablet Take 12.5 mg by mouth daily.        No current facility-administered medications for this visit.   No Known Allergies   Past medical history, social history, and family history reviewed and updated.  ROS: Denies orthopnea, PND. He notes mild stable pedal edema. All of the systems reviewed and are negative.  PHYSICAL EXAM: BP 94/60  Pulse 72  Ht 5\' 8"  (1.727 m)  Wt 84.709 kg (186 lb 12 oz)  BMI 28.4 kg/m2;  Body mass index is 28.4 kg/(m^2). General-Well developed; no acute distress Body habitus-proportionate weight and height Neck-mild JVD; no carotid bruits Lungs-clear lung fields; resonant to percussion Cardiovascular-normal PMI; normal S1 and S2 Abdomen-normal bowel sounds; soft and non-tender without masses or organomegaly Musculoskeletal-No deformities, no cyanosis or clubbing Neurologic-Normal cranial nerves; symmetric strength and tone Skin-Warm, no significant  lesions Extremities-distal pulses intact; 1+ edema with chronic stasis changes  EKG: 09/26/12  atrial fibrillation with predominant ventricular demand pacing.  Verona Bing, MD 12/04/2012  7:15 PM  ASSESSMENT AND PLAN

## 2012-12-04 NOTE — Assessment & Plan Note (Signed)
Atrial fibrillation is now constant, and there is no documented tachycardia. Current medications are effective and appropriate.

## 2012-12-04 NOTE — Assessment & Plan Note (Signed)
Patient continues to do well with warfarin therapy. There is no compelling need to switch him to a novel oral anticoagulant.

## 2012-12-04 NOTE — Assessment & Plan Note (Signed)
Renal function has remained moderately impaired and has been stable for at least the past year. We will continue to monitor.

## 2012-12-06 ENCOUNTER — Ambulatory Visit: Payer: Medicare Other | Admitting: Internal Medicine

## 2012-12-08 ENCOUNTER — Telehealth: Payer: Self-pay | Admitting: Cardiology

## 2012-12-08 NOTE — Telephone Encounter (Signed)
Called pt to confirm current signs and symptoms however number noted busy time 3, will try to contact pt again

## 2012-12-08 NOTE — Telephone Encounter (Signed)
Patient's wife states that patient has cough and thinks it has to do with CHF.  Would like return phone call. / tgs

## 2012-12-11 NOTE — Telephone Encounter (Signed)
Pt noted he feels much better today and does not need any further assistance at this time

## 2012-12-19 ENCOUNTER — Ambulatory Visit (INDEPENDENT_AMBULATORY_CARE_PROVIDER_SITE_OTHER): Payer: Medicare Other | Admitting: Internal Medicine

## 2012-12-19 ENCOUNTER — Encounter: Payer: Self-pay | Admitting: Internal Medicine

## 2012-12-19 VITALS — BP 96/62 | HR 95 | Ht 69.0 in | Wt 185.0 lb

## 2012-12-19 DIAGNOSIS — J441 Chronic obstructive pulmonary disease with (acute) exacerbation: Secondary | ICD-10-CM

## 2012-12-19 DIAGNOSIS — G4733 Obstructive sleep apnea (adult) (pediatric): Secondary | ICD-10-CM

## 2012-12-19 DIAGNOSIS — J45901 Unspecified asthma with (acute) exacerbation: Secondary | ICD-10-CM

## 2012-12-19 MED ORDER — ZALEPLON 5 MG PO CAPS: ORAL_CAPSULE | ORAL | Status: DC

## 2012-12-19 MED ORDER — TIOTROPIUM BROMIDE MONOHYDRATE 18 MCG IN CAPS: 18.0000 ug | ORAL_CAPSULE | Freq: Every day | RESPIRATORY_TRACT | Status: DC

## 2012-12-19 NOTE — Progress Notes (Signed)
Subjective:    Patient ID: Mario Cline, male    DOB: Oct 05, 1928, 79 y.o.   MRN: [id number]  HPI 12/08/10- 82 yoM followed for OSA, complicated by rhinitis, CM, AFib, chronic CHF.     Wife here Last here June 05, 2010- for review of normal PFT and CPAP 11 Advanced- used all night every night and wife confirms he doesn't snore through it.  Bothersome watery rhinorhea resolved on its own so he never tried ipratropium   12/07/11- 83 yoM followed for OSA, complicated by rhinitis, CM, AFib, chronic CHF.         Wife here Wears for CPAP 11/ [facility] every night for approx 8 hours and doing well. Cardiology manages his cardiomyopathy-AICD/ pacer.  12/19/12- 84 yoM followed for OSA, complicated by rhinitis, CM, AFib, chronic CHF. Yearly follow up .  Wearing CPAP 11/ [facility]  every night for approx 7-8 hours.  Having trouble sleeping x 6-7 months.  Has daytime sleepiness. Nocturia wakes him frequently. If sits quietly can follow sleep. Cough and wheeze x1 month, no sputum or fever. CXR 7/21/4-  IMPRESSION:  Cardiomegaly without acute cardiopulmonary findings.  Original Report Authenticated By: Genevive Bi, M.D.   ROS-see HPI Constitutional:   No-   weight loss, night sweats, fevers, chills, +fatigue, lassitude. HEENT:   No-  headaches, difficulty swallowing, tooth/dental problems, sore throat,       No-  sneezing, itching, ear ache, nasal congestion, post nasal drip,  CV:  No-   chest pain, orthopnea, PND, swelling in lower extremities, anasarca, dizziness, palpitations Resp: No-   shortness of breath with exertion or at rest.              No-   productive cough,  + non-productive cough,  No- coughing up of blood.              No-   change in color of mucus.  + wheezing.   Skin: No-   rash or lesions. GI:  No-   heartburn, indigestion, abdominal pain, nausea, vomiting,  GU: + nocturia MS:  No-   joint pain or swelling.   Neuro-     nothing unusual Psych:  No-  change in mood or affect. No depression or anxiety.  No memory loss.  Objective:   Physical Exam  General- Alert, Oriented, Affect-appropriate, Distress- none acute   obese Skin- rash-none, lesions- none, excoriation- none Lymphadenopathy- none Head- atraumatic            Eyes- Gross vision intact, PERRLA, conjunctivae clear secretions            Ears- Hearing, canals            Nose- Clear, No-Septal dev, mucus, polyps, erosion, perforation             Throat- Mallampati IV , mucosa clear , drainage- none, tonsils- atrophic Neck- flexible , trachea midline, no stridor , thyroid nl, carotid no bruit Chest - symmetrical excursion , unlabored           Heart/CV- RRR , no murmur , no gallop  , no rub, nl s1 s2                           + JVD 1 cm , edema1+, stasis changes- none, varices- none           Lung- clear to P&A, wheeze- none, cough- none , dullness-none, rub- none  Chest wall- +AICD Abd-  Br/ Gen/ Rectal- Not done, not indicated Extrem- cyanosis- none, clubbing, none, atrophy- none, strength- nl. Superficial varices. Neuro- grossly intact to observation    Assessment & Plan:

## 2012-12-19 NOTE — Patient Instructions (Addendum)
We can continue CPAP 11/ [facility] and script to try Spiriva once daily for cough and wheeze  Script for Sonata/ zaleplon for sleep if needed.

## 2013-01-01 ENCOUNTER — Encounter: Payer: Self-pay | Admitting: Internal Medicine

## 2013-01-01 ENCOUNTER — Ambulatory Visit (INDEPENDENT_AMBULATORY_CARE_PROVIDER_SITE_OTHER): Payer: Medicare Other | Admitting: Internal Medicine

## 2013-01-01 VITALS — BP 92/60 | HR 96 | Ht 69.0 in | Wt 186.0 lb

## 2013-01-01 DIAGNOSIS — I428 Other cardiomyopathies: Secondary | ICD-10-CM

## 2013-01-01 DIAGNOSIS — I5022 Chronic systolic (congestive) heart failure: Secondary | ICD-10-CM

## 2013-01-01 DIAGNOSIS — Z95810 Presence of automatic (implantable) cardiac defibrillator: Secondary | ICD-10-CM

## 2013-01-01 LAB — ICD DEVICE OBSERVATION
AL AMPLITUDE: 0.4 mv
ATRIAL PACING ICD: 0 pct
BAMS-0001: 150 {beats}/min
BAMS-0003: 70 {beats}/min
BRDY-0002RV: 60 {beats}/min
DEV-0020ICD: NEGATIVE
FVT: 0
MODE SWITCH EPISODES: 0
PACEART VT: 0
RV LEAD AMPLITUDE: 6.8 mv
RV LEAD THRESHOLD: 0.5 V
TZAT-0004FASTVT: 8
TZAT-0012FASTVT: 200 ms
TZAT-0018FASTVT: NEGATIVE
TZON-0003FASTVT: 300 ms
TZON-0010FASTVT: 80 ms
TZON-0010SLOWVT: 80 ms
TZST-0001FASTVT: 3
TZST-0001SLOWVT: 2
TZST-0001SLOWVT: 5
TZST-0003FASTVT: 36 J
TZST-0003SLOWVT: 15 J
TZST-0003SLOWVT: 36 J
VENTRICULAR PACING ICD: 17 pct
VF: 0

## 2013-01-01 NOTE — Assessment & Plan Note (Signed)
His St. Jude DDD ICD is working normally. Will recheck in several months. 

## 2013-01-01 NOTE — Patient Instructions (Addendum)
Your physician recommends that you schedule a follow-up appointment in: 1 year with Dr Ladona Ridgel and Remote Check Dec 1

## 2013-01-01 NOTE — Assessment & Plan Note (Signed)
His CHF symptoms are well controlled. He will continue his current meds and maintain a low sodium diet.

## 2013-01-01 NOTE — Progress Notes (Signed)
HPI [name] returns today for followup. He is a pleasant 79 yo man with a h/o an ICM, VT, chronic class 2 systolic CHF, and HTN. In the interim, he has done well. He denies chest pain, sob, or syncope. No Known Allergies   Current Outpatient Prescriptions  Medication Sig Dispense Refill  . acetaminophen (TYLENOL) 500 MG tablet Take 500 mg by mouth every 6 (six) hours as needed. For headache       . carvedilol (COREG) 12.5 MG tablet TAKE 1 TABLET BY MOUTH TWICE DAILY WITH MEALS.  60 tablet  3  . COUMADIN 5 MG tablet TAKE AS DIRECTED.  30 tablet  3  . furosemide (LASIX) 40 MG tablet TAKE ONE TAB BY MOUTH ONCE DAILY. IF WEIGHT GREATER THAN 180 LBS THENTAKE 2 TABS DAILY UNTIL WEIGHT REACHES 175 LBS.  45 tablet  3  . losartan (COZAAR) 50 MG tablet Take 25 mg by mouth daily.      . Multiple Vitamins-Minerals (MULTIVITAMINS THER. W/MINERALS) TABS Take 1 tablet by mouth daily.        Marland Kitchen spironolactone (ALDACTONE) 25 MG tablet Take 12.5 mg by mouth daily.       Marland Kitchen tiotropium (SPIRIVA) 18 MCG inhalation capsule Place 1 capsule (18 mcg total) into inhaler and inhale daily.  30 capsule  6  . zaleplon (SONATA) 5 MG capsule 1 for sleep as needed, no less than 3 hours before morning  30 capsule  5   No current facility-administered medications for this visit.     Past Medical History  Diagnosis Date  . Nonischemic cardiomyopathy     Cath in 1997-nonobstructive disease with normal EF; h/o CHF; EF-45% in '03, 25% in '08, 25-30% in '11;   . Hypertension   . Orthostatic hypotension   . Obstructive sleep apnea 2002    CPAP prescribed  . DJD (degenerative joint disease)   . AICD (automatic cardioverter/defibrillator) present 06/2007    AICD/dual-chamber pacemaker - (St. Jude)- 2/09;   . Atrial fibrillation 04/2009    Rare brief episodes by ICD interrogation until 03/2011->persistent  . Chronic anticoagulation     ROS:   All systems reviewed and negative except as noted in the HPI.   Past Surgical  History  Procedure Laterality Date  . Tonsillectomy    . Pacemaker insertion    . Cholecystectomy    . Posterior laminectomy / decompression lumbar spine  1985    +fusion; Dr. Hope Pigeon  . Cardiac defibrillator placement  06/2007     Family History  Problem Relation Age of Onset  . Heart failure Mother   . Heart attack Father   . Cancer Brother     x2     History   Social History  . Marital Status: Married    Spouse Name: N/A    Number of Children: 2  . Years of Education: N/A   Occupational History  . Retired     Garment/textile technologist   Social History Main Topics  . Smoking status: Never Smoker   . Smokeless tobacco: Never Used  . Alcohol Use: 0.5 oz/week    1 drink(s) per week  . Drug Use: No  . Sexual Activity: No   Other Topics Concern  . Not on file   Social History Narrative  . No narrative on file     BP 92/60  Pulse 96  Ht 5\' 9"  (1.753 m)  Wt 186 lb (84.369 kg)  BMI 27.45 kg/m2  SpO2 99%  Physical Exam:  Well appearing elderly man, NAD HEENT: Unremarkable Neck:  7 cm JVD, no thyromegally Back:  No CVA tenderness Lungs:  Clear with no wheezes, rales, or rhonchi HEART:  Regular rate rhythm, no murmurs, no rubs, no clicks Abd:  soft, positive bowel sounds, no organomegally, no rebound, no guarding Ext:  2 plus pulses, no edema, no cyanosis, no clubbing Skin:  No rashes no nodules Neuro:  CN II through XII intact, motor grossly intact   DEVICE  Normal device function.  See PaceArt for details.   Assess/Plan:

## 2013-01-07 DIAGNOSIS — J441 Chronic obstructive pulmonary disease with (acute) exacerbation: Secondary | ICD-10-CM | POA: Insufficient documentation

## 2013-01-07 NOTE — Assessment & Plan Note (Signed)
Good compliance and control. Satisfied with current pressure. Discussed genital help to maintain sleep, recognizing nocturia. Plan- Try Sonata or benadryl

## 2013-01-07 NOTE — Assessment & Plan Note (Signed)
Question if this is a summer air-quality problem. Watch for fluid overload. Plan-trial of Spiriva with discussion

## 2013-01-16 ENCOUNTER — Telehealth: Payer: Self-pay | Admitting: Internal Medicine

## 2013-01-16 NOTE — Telephone Encounter (Signed)
Spoke with pt's wife and advised that copy of ov note from 12/20/12 was sent to Dr Ouida Sills.

## 2013-01-23 ENCOUNTER — Encounter: Payer: Self-pay | Admitting: Internal Medicine

## 2013-01-26 ENCOUNTER — Other Ambulatory Visit: Payer: Self-pay | Admitting: Cardiology

## 2013-01-29 ENCOUNTER — Ambulatory Visit (INDEPENDENT_AMBULATORY_CARE_PROVIDER_SITE_OTHER): Payer: Medicare Other | Admitting: Cardiovascular Disease

## 2013-01-29 VITALS — BP 99/54 | HR 89 | Ht 69.0 in | Wt 189.0 lb

## 2013-01-29 DIAGNOSIS — I4891 Unspecified atrial fibrillation: Secondary | ICD-10-CM

## 2013-01-29 DIAGNOSIS — I428 Other cardiomyopathies: Secondary | ICD-10-CM

## 2013-01-29 DIAGNOSIS — I509 Heart failure, unspecified: Secondary | ICD-10-CM

## 2013-01-29 DIAGNOSIS — Z7901 Long term (current) use of anticoagulants: Secondary | ICD-10-CM

## 2013-01-29 DIAGNOSIS — I10 Essential (primary) hypertension: Secondary | ICD-10-CM

## 2013-01-29 DIAGNOSIS — I5023 Acute on chronic systolic (congestive) heart failure: Secondary | ICD-10-CM

## 2013-01-29 MED ORDER — METOLAZONE 2.5 MG PO TABS: 2.5000 mg | ORAL_TABLET | Freq: Every day | ORAL | Status: DC

## 2013-01-29 MED ORDER — METOLAZONE 2.5 MG PO TABS: 2.5000 mg | ORAL_TABLET | Freq: Two times a day (BID) | ORAL | Status: DC

## 2013-01-29 MED ORDER — FUROSEMIDE 80 MG PO TABS: 80.0000 mg | ORAL_TABLET | Freq: Two times a day (BID) | ORAL | Status: DC

## 2013-01-29 NOTE — Patient Instructions (Addendum)
Your physician recommends that you schedule a follow-up appointment in: THIS Friday 02-02-13 2PM TO SEE Dr. Purvis Sheffield   Your physician has recommended you make the following change in your medication:   1) INCREASE YOUR LASIX TO 80MG  TWICE DAILY 2) START TAKING METOLAZONE 2.5 TWICE DAILY   Your physician recommends that you return for lab work in: Thursday [facility] GIVEN)

## 2013-01-29 NOTE — Addendum Note (Signed)
Addended by: Derry Lory A on: 01/29/2013 03:57 PM   Modules accepted: Orders

## 2013-01-29 NOTE — Progress Notes (Signed)
Patient ID: TERELL KINCY, male   DOB: 1928/05/26, 79 y.o.   MRN: [id number]   SUBJECTIVE: Mr. Cristobal has a known h/o a nonischemic cardiomyopathy and an ICD. His echo from May 2013 revealed an EF of 15%. When he was evaluated by Dr. Ladona Ridgel in late August, he was apparently doing well with no edema or shortness of breath. He normally takes Lasix 40 mg daily and spironolactone 12.5 mg daily.  He has been having gradual leg swelling up to the knees for the past 2 weeks. He's been avoiding sodium-rich foods, and rarely eats out. He denies chest pain and shortness of breath. He has had a mild cough lately, more noticed by his wife.  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    No Known Allergies  Current Outpatient Prescriptions  Medication Sig Dispense Refill  . acetaminophen (TYLENOL) 500 MG tablet Take 500 mg by mouth every 6 (six) hours as needed. For headache       . carvedilol (COREG) 12.5 MG tablet TAKE 1 TABLET BY MOUTH TWICE DAILY WITH MEALS.  60 tablet  5  . COUMADIN 5 MG tablet TAKE AS DIRECTED.  30 tablet  3  . furosemide (LASIX) 40 MG tablet TAKE ONE TAB BY MOUTH ONCE DAILY. IF WEIGHT GREATER THAN 180 LBS THENTAKE 2 TABS DAILY UNTIL WEIGHT REACHES 175 LBS.  45 tablet  3  . losartan (COZAAR) 50 MG tablet Take 25 mg by mouth daily.      . Multiple Vitamins-Minerals (MULTIVITAMINS THER. W/MINERALS) TABS Take 1 tablet by mouth daily.        Marland Kitchen spironolactone (ALDACTONE) 25 MG tablet Take 12.5 mg by mouth daily.       Marland Kitchen tiotropium (SPIRIVA) 18 MCG inhalation capsule Place 1 capsule (18 mcg total) into inhaler and inhale daily.  30 capsule  6  . zaleplon (SONATA) 5 MG capsule 1 for sleep as needed, no less than 3 hours before morning  30 capsule  5   No current facility-administered medications for this visit.    Past Medical History  Diagnosis Date  . Nonischemic cardiomyopathy     Cath in 1997-nonobstructive  disease with normal EF; h/o CHF; EF-45% in '03, 25% in '08, 25-30% in '11;   . Hypertension   . Orthostatic hypotension   . Obstructive sleep apnea 2002    CPAP prescribed  . DJD (degenerative joint disease)   . AICD (automatic cardioverter/defibrillator) present 06/2007    AICD/dual-chamber pacemaker - (St. Jude)- 2/09;   . Atrial fibrillation 04/2009    Rare brief episodes by ICD interrogation until 03/2011->persistent  . Chronic anticoagulation     Past Surgical History  Procedure Laterality Date  . Tonsillectomy    . Pacemaker insertion    . Cholecystectomy    . Posterior laminectomy / decompression lumbar spine  1985    +fusion; Dr. Hope Pigeon  . Cardiac defibrillator placement  06/2007    History   Social History  . Marital Status: Married    Spouse Name: N/A    Number of Children: 2  . Years of Education: N/A   Occupational History  . Retired     Garment/textile technologist   Social History Main Topics  . Smoking status: Never Smoker   . Smokeless tobacco: Never Used  . Alcohol Use: 0.5 oz/week    1 drink(s) per week  . Drug  Use: No  . Sexual Activity: No   Other Topics Concern  . Not on file   Social History Narrative  . No narrative on file    BP 99/54 Pulse 89    PHYSICAL EXAM General: NAD Neck: No JVD, no thyromegaly or thyroid nodule.  Lungs: Clear to auscultation bilaterally with normal respiratory effort. CV: Nondisplaced PMI.  Heart irregular rhythm, normal rate, normal S1/S2, no S3/S4, no murmur.  2+ pitting pedal edema up to the knees b/l.  No carotid bruit.   Abdomen: Soft, nontender, no hepatosplenomegaly, no distention.  Neurologic: Alert and oriented x 3.  Psych: Normal affect. Extremities: No clubbing or cyanosis.   ECG: reviewed and available in electronic records.      ASSESSMENT AND PLAN: 1. Acute on chronic systolic heart failure: I will increase Lasix to 80 mg po bid and also start Metolazone 2.5 mg bid for the next  several days. I will check a BMP on Thursday, 9/25, to assess his renal function. I've instructed him to purchase a urinal as well so as to avoid accidents.  2. Atrial fibrillation: rate is controlled. 3. HTN: controlled, and actually slightly hypotensive.  Dispo: I will have him f/u with me this Friday, 9/26.   Prentice Docker, M.D., F.A.C.C.

## 2013-01-30 ENCOUNTER — Encounter: Payer: Self-pay | Admitting: Cardiovascular Disease

## 2013-01-31 ENCOUNTER — Telehealth: Payer: Self-pay | Admitting: *Deleted

## 2013-01-31 NOTE — Telephone Encounter (Signed)
Pt wife called to advise the pt has not lost any fluid since his recent OV, Dr. Purvis Sheffield gave verbal orders to have pt increase the metolazone to 5mg  tonight as well as Metalozone 5mg  BID, also increase Lasix 80 to TID, start tonight with an additional Lasix 80mg  and keep upcoming apt for this Friday 02-02-13, pt wife understood Pt will call back to our office with any further concerns if necessary

## 2013-02-01 LAB — BASIC METABOLIC PANEL
CO2: 29 mEq/L (ref 19–32)
Calcium: 8.8 mg/dL (ref 8.4–10.5)
Creat: 1.93 mg/dL — ABNORMAL HIGH (ref 0.50–1.35)
Glucose, Bld: 97 mg/dL (ref 70–99)
Sodium: 132 mEq/L — ABNORMAL LOW (ref 135–145)

## 2013-02-02 ENCOUNTER — Encounter (HOSPITAL_COMMUNITY): Payer: Self-pay | Admitting: General Practice

## 2013-02-02 ENCOUNTER — Inpatient Hospital Stay (HOSPITAL_COMMUNITY)
Admission: AD | Admit: 2013-02-02 | Discharge: 2013-02-06 | DRG: 292 | Disposition: A | Discharge status: Home Health | Payer: Medicare Other | Source: Ambulatory Visit | Attending: Internal Medicine | Admitting: Internal Medicine

## 2013-02-02 ENCOUNTER — Ambulatory Visit (INDEPENDENT_AMBULATORY_CARE_PROVIDER_SITE_OTHER): Payer: Medicare Other | Admitting: Cardiovascular Disease

## 2013-02-02 ENCOUNTER — Inpatient Hospital Stay (HOSPITAL_COMMUNITY): Payer: Medicare Other

## 2013-02-02 ENCOUNTER — Encounter: Payer: Self-pay | Admitting: Cardiovascular Disease

## 2013-02-02 VITALS — BP 76/52 | HR 85 | Ht 69.0 in | Wt 183.1 lb

## 2013-02-02 DIAGNOSIS — N289 Disorder of kidney and ureter, unspecified: Secondary | ICD-10-CM

## 2013-02-02 DIAGNOSIS — Z7901 Long term (current) use of anticoagulants: Secondary | ICD-10-CM

## 2013-02-02 DIAGNOSIS — E871 Hypo-osmolality and hyponatremia: Secondary | ICD-10-CM | POA: Diagnosis present

## 2013-02-02 DIAGNOSIS — I959 Hypotension, unspecified: Secondary | ICD-10-CM | POA: Diagnosis present

## 2013-02-02 DIAGNOSIS — I5023 Acute on chronic systolic (congestive) heart failure: Secondary | ICD-10-CM

## 2013-02-02 DIAGNOSIS — G4733 Obstructive sleep apnea (adult) (pediatric): Secondary | ICD-10-CM | POA: Diagnosis present

## 2013-02-02 DIAGNOSIS — I428 Other cardiomyopathies: Secondary | ICD-10-CM | POA: Diagnosis present

## 2013-02-02 DIAGNOSIS — I509 Heart failure, unspecified: Secondary | ICD-10-CM

## 2013-02-02 DIAGNOSIS — N183 Chronic kidney disease, stage 3 unspecified: Secondary | ICD-10-CM | POA: Diagnosis present

## 2013-02-02 DIAGNOSIS — Z95810 Presence of automatic (implantable) cardiac defibrillator: Secondary | ICD-10-CM | POA: Diagnosis present

## 2013-02-02 DIAGNOSIS — I129 Hypertensive chronic kidney disease with stage 1 through stage 4 chronic kidney disease, or unspecified chronic kidney disease: Secondary | ICD-10-CM | POA: Diagnosis present

## 2013-02-02 DIAGNOSIS — M1990 Unspecified osteoarthritis, unspecified site: Secondary | ICD-10-CM | POA: Diagnosis present

## 2013-02-02 DIAGNOSIS — Z23 Encounter for immunization: Secondary | ICD-10-CM

## 2013-02-02 DIAGNOSIS — I4891 Unspecified atrial fibrillation: Secondary | ICD-10-CM

## 2013-02-02 DIAGNOSIS — I1310 Hypertensive heart and chronic kidney disease without heart failure, with stage 1 through stage 4 chronic kidney disease, or unspecified chronic kidney disease: Secondary | ICD-10-CM

## 2013-02-02 DIAGNOSIS — J449 Chronic obstructive pulmonary disease, unspecified: Secondary | ICD-10-CM | POA: Diagnosis present

## 2013-02-02 DIAGNOSIS — J4489 Other specified chronic obstructive pulmonary disease: Secondary | ICD-10-CM | POA: Diagnosis present

## 2013-02-02 HISTORY — DX: Chronic kidney disease, stage 3 unspecified: N18.30

## 2013-02-02 HISTORY — DX: Chronic kidney disease, stage 3 (moderate): N18.3

## 2013-02-02 LAB — COMPREHENSIVE METABOLIC PANEL
ALT: 14 U/L (ref 0–53)
Alkaline Phosphatase: 85 U/L (ref 39–117)
CO2: 29 mEq/L (ref 19–32)
Chloride: 89 mEq/L — ABNORMAL LOW (ref 96–112)
GFR calc Af Amer: 34 mL/min — ABNORMAL LOW (ref >90)
GFR calc non Af Amer: 29 mL/min — ABNORMAL LOW (ref >90)
Glucose, Bld: 120 mg/dL — ABNORMAL HIGH (ref 70–99)
Potassium: 3.8 mEq/L (ref 3.5–5.1)
Sodium: 129 mEq/L — ABNORMAL LOW (ref 135–145)
Total Bilirubin: 1.7 mg/dL — ABNORMAL HIGH (ref 0.3–1.2)

## 2013-02-02 LAB — CBC
Hemoglobin: 13.2 g/dL (ref 13.0–17.0)
MCH: 33.3 pg (ref 26.0–34.0)
MCHC: 34.5 g/dL (ref 30.0–36.0)
Platelets: 147 10*3/uL — ABNORMAL LOW (ref 150–400)

## 2013-02-02 LAB — BLOOD GAS, ARTERIAL
Bicarbonate: 22.7 mEq/L (ref 20.0–24.0)
Patient temperature: 37
TCO2: 19.9 mmol/L (ref 0–100)
pH, Arterial: 7.486 — ABNORMAL HIGH (ref 7.350–7.450)
pO2, Arterial: 97.4 mmHg (ref 80.0–100.0)

## 2013-02-02 LAB — TROPONIN I
Troponin I: <0.3 ng/mL (ref <0.30)
Troponin I: <0.3 ng/mL (ref <0.30)

## 2013-02-02 LAB — PROTIME-INR: Prothrombin Time: 25.3 seconds — ABNORMAL HIGH (ref 11.6–15.2)

## 2013-02-02 LAB — PRO B NATRIURETIC PEPTIDE: Pro B Natriuretic peptide (BNP): 7763 pg/mL — ABNORMAL HIGH (ref 0–450)

## 2013-02-02 LAB — APTT: aPTT: 45 seconds — ABNORMAL HIGH (ref 24–37)

## 2013-02-02 MED ORDER — ZOLPIDEM TARTRATE 5 MG PO TABS
5.0000 mg | ORAL_TABLET | Freq: Once | ORAL | Status: AC
  Administered 2013-02-02: 5 mg via ORAL
  Filled 2013-02-02: qty 1

## 2013-02-02 MED ORDER — HYDROCODONE-ACETAMINOPHEN 5-325 MG PO TABS: 1.0000 | ORAL_TABLET | ORAL | Status: DC | PRN

## 2013-02-02 MED ORDER — FUROSEMIDE 10 MG/ML IJ SOLN
40.0000 mg | Freq: Three times a day (TID) | INTRAMUSCULAR | Status: DC
  Administered 2013-02-02 – 2013-02-05 (×9): 40 mg via INTRAVENOUS
  Filled 2013-02-02 (×10): qty 4

## 2013-02-02 MED ORDER — TIOTROPIUM BROMIDE MONOHYDRATE 18 MCG IN CAPS
18.0000 ug | ORAL_CAPSULE | Freq: Every day | RESPIRATORY_TRACT | Status: DC
  Filled 2013-02-02: qty 5

## 2013-02-02 MED ORDER — BENZONATATE 100 MG PO CAPS: 100.0000 mg | ORAL_CAPSULE | Freq: Three times a day (TID) | ORAL | Status: DC | PRN

## 2013-02-02 MED ORDER — WARFARIN - PHARMACIST DOSING INPATIENT: Status: DC

## 2013-02-02 MED ORDER — INFLUENZA VAC SPLIT QUAD 0.5 ML IM SUSP
0.5000 mL | INTRAMUSCULAR | Status: AC
  Administered 2013-02-03: 0.5 mL via INTRAMUSCULAR
  Filled 2013-02-02: qty 0.5

## 2013-02-02 MED ORDER — LEVALBUTEROL HCL 0.63 MG/3ML IN NEBU: 0.6300 mg | INHALATION_SOLUTION | Freq: Four times a day (QID) | RESPIRATORY_TRACT | Status: DC | PRN

## 2013-02-02 MED ORDER — ACETAMINOPHEN 650 MG RE SUPP: 650.0000 mg | RECTAL | Status: DC | PRN

## 2013-02-02 MED ORDER — ACETAMINOPHEN 325 MG PO TABS: 650.0000 mg | ORAL_TABLET | ORAL | Status: DC | PRN

## 2013-02-02 MED ORDER — WARFARIN SODIUM 5 MG PO TABS
5.0000 mg | ORAL_TABLET | Freq: Once | ORAL | Status: AC
  Administered 2013-02-02: 5 mg via ORAL
  Filled 2013-02-02: qty 1

## 2013-02-02 NOTE — Patient Instructions (Addendum)
Pt to be admitted to [facility]

## 2013-02-02 NOTE — Progress Notes (Signed)
Patient ID: Mario Cline, male   DOB: Jan 09, 1929, 79 y.o.   MRN: [id number]    CARDIOLOGY CONSULT NOTE  Patient ID: Mario Cline MRN: [id number] DOB/AGE: [birth year] 79 y.o.    HPI: Mario Cline has a known h/o a nonischemic cardiomyopathy and an ICD. His echo from May 2013 revealed an EF of 15%. When he was evaluated by Dr. Ladona Ridgel in late August, he was apparently doing well with no edema or shortness of breath. He normally takes Lasix 40 mg daily and spironolactone 12.5 mg daily.  He has been having gradual leg swelling up to the knees for the past 3 weeks. He's been avoiding sodium-rich foods, and rarely eats out. He denies chest pain and palpitations. He has had a mild cough lately, more noticed by his wife. He's also been experiencing 2-pillow orthopnea, but denies paroxysmal nocturnal dyspnea. When I saw him earlier in the week,  he'd been taking 80 mg of Lasix daily. At that time, I increased his Lasix to 80 mg bid and started metolazone 2.5 mg bid. His wife called back within 2 days stating that her husband's leg swelling was unchanged, and he had not been urinating more frequently in spite of the increased diuretic dosing. I then recommended he try Lasix 80 mg tid and increase the metolazone to 5 mg bid and get a BMP.  In spite of these modifications to his medication regimen, he's not had a significant increase in his urinary frequency and his legs are markedly swollen, moreso his right leg. He feels his abdomen is not swollen, but upon further discussion, he thinks his scrotum may be slightly more swollen.  SocHx: nonsmoker, married.  No Known Allergies  Current Outpatient Prescriptions  Medication Sig Dispense Refill  . acetaminophen (TYLENOL) 500 MG tablet Take 500 mg by mouth every 6 (six) hours as needed. For headache       . carvedilol (COREG) 12.5 MG tablet TAKE 1 TABLET BY MOUTH TWICE DAILY WITH MEALS.  60 tablet  5  . COUMADIN 5 MG tablet TAKE AS DIRECTED.  30 tablet  3  .  furosemide (LASIX) 80 MG tablet Take 80 mg by mouth 3 (three) times daily.      Marland Kitchen losartan (COZAAR) 50 MG tablet Take 50 mg by mouth daily.       . metolazone (ZAROXOLYN) 2.5 MG tablet Take 5 mg by mouth 2 (two) times daily.      Marland Kitchen spironolactone (ALDACTONE) 25 MG tablet Take 12.5 mg by mouth daily.       Marland Kitchen tiotropium (SPIRIVA) 18 MCG inhalation capsule Place 1 capsule (18 mcg total) into inhaler and inhale daily.  30 capsule  6  . zaleplon (SONATA) 5 MG capsule 1 for sleep as needed, no less than 3 hours before morning  30 capsule  5   No current facility-administered medications for this visit.    Past Medical History  Diagnosis Date  . Nonischemic cardiomyopathy     Cath in 1997-nonobstructive disease with normal EF; h/o CHF; EF-45% in '03, 25% in '08, 25-30% in '11;   . Hypertension   . Orthostatic hypotension   . Obstructive sleep apnea 2002    CPAP prescribed  . DJD (degenerative joint disease)   . AICD (automatic cardioverter/defibrillator) present 06/2007    AICD/dual-chamber pacemaker - (St. Jude)- 2/09;   . Atrial fibrillation 04/2009    Rare brief episodes by ICD interrogation until 03/2011->persistent  . Chronic anticoagulation     Past  Surgical History  Procedure Laterality Date  . Tonsillectomy    . Pacemaker insertion    . Cholecystectomy    . Posterior laminectomy / decompression lumbar spine  1985    +fusion; Dr. Hope Pigeon  . Cardiac defibrillator placement  06/2007    History   Social History  . Marital Status: Married    Spouse Name: N/A    Number of Children: 2  . Years of Education: N/A   Occupational History  . Retired     Garment/textile technologist   Social History Main Topics  . Smoking status: Never Smoker   . Smokeless tobacco: Never Used  . Alcohol Use: 0.5 oz/week    1 drink(s) per week  . Drug Use: No  . Sexual Activity: No   Other Topics Concern  . Not on file   Social History Narrative  . No narrative on file      Family History  Problem Relation Age of Onset  . Heart failure Mother   . Heart attack Father   . Cancer Brother     x2       Review of systems complete and found to be negative unless listed above in HPI     Physical exam Blood pressure 76/52, pulse 85, height 5\' 9"  (1.753 m), weight 183 lb 1.9 oz (83.063 kg). General: NAD Neck: No JVD, no thyromegaly or thyroid nodule.  Lungs: Clear to auscultation bilaterally with normal respiratory effort. CV: Nondisplaced PMI.  Heart regular S1/S2, +S3, no S4.  2-3+ pitting pedal edema bilaterally, with slightly greater swelling of the right leg.  No carotid bruit.  Normal pedal pulses.  Abdomen: Soft, nontender, no hepatosplenomegaly, mild distention.  Skin: Intact without lesions or rashes.  Neurologic: Alert and oriented x 3.  Psych: Normal affect. Extremities: No clubbing or cyanosis.  HEENT: Normal.   Labs:   Lab Results  Component Value Date   WBC 6.1 11/22/2012   HGB 13.4 11/22/2012   HCT 39.6 11/22/2012   MCV 96.8 11/22/2012   PLT 156 11/22/2012    Recent Labs Lab 02/01/13 0824  NA 132*  K 4.9  CL 94*  CO2 29  BUN 57*  CREATININE 1.93*  CALCIUM 8.8  GLUCOSE 97   Lab Results  Component Value Date   CKTOTAL 89 02/14/2008   CKMB 3.3 02/14/2008   TROPONINI  Value: 0.01        NO INDICATION OF MYOCARDIAL INJURY. 02/14/2008    Lab Results  Component Value Date   CHOL 159 09/17/2009   CHOL 159 09/17/2009   CHOL 146 08/01/2009   Lab Results  Component Value Date   HDL 34 09/17/2009   HDL 34 0/98/1191   HDL 27* 08/01/2009   Lab Results  Component Value Date   LDLCALC 101 09/17/2009   LDLCALC 101 09/17/2009   LDLCALC 97 08/01/2009   Lab Results  Component Value Date   TRIG 120 09/17/2009   TRIG 120 09/17/2009   TRIG 109 08/01/2009   Lab Results  Component Value Date   CHOLHDL 5.4 Ratio 08/01/2009   No results found for this basename: LDLDIRECT           ASSESSMENT AND PLAN:  1. Acute on chronic  systolic heart failure: given that he's not had any significant diuretic response with multiple oral diuretics, I will hospitalize him for intravenous diuresis. I would recommend starting Lasix 40 IV mg tid. If he does not have a reasonable response within the  next 12-24 hours, I would consider starting a Lasix infusion. I do not feel that his BP reading is accurate, as he is functioning well from a cognitive standpoint, and is able to walk without assistance without gait disturbance. While his true arterial pressure may be hypotensive, this is likely due to his current systolic decompensation, and will likely improve to some degree (albeit his EF is low and thus would expect a lower BP overall) with diuresis, which will improve his cardiac output. If he still hasn't had an adequate diuretic response, one could consider starting a dobutamine infusion in addition to the lasix infusion, but this can be deferred at this time. I would continue his carvedilol but could reduce the dose to 3.125 mg bid for BP purposes. I would recommend stopping the oral furosemide, metolazone, and spironolactone. The spironolactone can be restarted prior to discharge if deemed feasible. The losartan can also be held for BP purposes but reinstituted prior to discharge if deemed feasible.  2. Atrial fibrillation: on warfarin.  3. AICD: functioning normally.  Dispo: I've discussed the plan to hospitalize this patient for IV diuresis with Dr. Elliot Cousin and she has agreed to assume care. Please do not hesitate to call me with further questions.  Signed: Prentice Docker, M.D., F.A.C.C. 02/02/2013, 3:38 PM

## 2013-02-02 NOTE — Progress Notes (Signed)
ANTICOAGULATION CONSULT NOTE - Initial Consult  Pharmacy Consult for Warfarin Indication: atrial fibrillation  No Known Allergies  Patient Measurements: Height: 5\' 9"  (175.3 cm) Weight: 182 lb 12.2 oz (82.9 kg) IBW/kg (Calculated) : 70.7  Vital Signs: Temp: 98.2 F (36.8 C) (09/26 1623) Temp src: Oral (09/26 1623) BP: 83/56 mmHg (09/26 1623) Pulse Rate: 86 (09/26 1623)  Labs:  Recent Labs  02/01/13 0824 02/02/13 1811 02/02/13 1813  HGB  --  13.2  --   HCT  --  38.3*  --   PLT  --  147*  --   APTT  --   --  45*  LABPROT  --   --  25.3*  INR  --   --  2.39*  CREATININE 1.93* 2.01*  --   TROPONINI  --  <0.30  --     Estimated Creatinine Clearance: 28.3 ml/min (by C-G formula based on Cr of 2.01).   Medical History: Past Medical History  Diagnosis Date  . Nonischemic cardiomyopathy     Cath in 1997-nonobstructive disease with normal EF; h/o CHF; EF-45% in '03, 25% in '08, 25-30% in '11;   . Hypertension   . Orthostatic hypotension   . Obstructive sleep apnea 2002    CPAP prescribed  . DJD (degenerative joint disease)   . AICD (automatic cardioverter/defibrillator) present 06/2007    AICD/dual-chamber pacemaker - (St. Jude)- 2/09;   . Atrial fibrillation 04/2009    Rare brief episodes by ICD interrogation until 03/2011->persistent  . Chronic anticoagulation   . Chronic kidney disease, stage III (moderate)     Cardiorenal disease.    Medications:  Prescriptions prior to admission  Medication Sig Dispense Refill  . carvedilol (COREG) 12.5 MG tablet Take 12.5 mg by mouth 2 (two) times daily with a meal.      . furosemide (LASIX) 80 MG tablet Take 80 mg by mouth 3 (three) times daily.      Marland Kitchen losartan (COZAAR) 100 MG tablet Take 50 mg by mouth every morning.      . metolazone (ZAROXOLYN) 2.5 MG tablet Take 5 mg by mouth 2 (two) times daily.      . Multiple Vitamin (MULTIVITAMIN WITH MINERALS) TABS tablet Take 1 tablet by mouth every 30 (thirty) days.      Marland Kitchen  spironolactone (ALDACTONE) 25 MG tablet Take 12.5 mg by mouth daily.       Marland Kitchen tiotropium (SPIRIVA) 18 MCG inhalation capsule Place 1 capsule (18 mcg total) into inhaler and inhale daily.  30 capsule  6  . warfarin (COUMADIN) 5 MG tablet Take 2.5-5 mg by mouth See admin instructions. **Take one-half tablet on Mondays and Thursdays. Takes one tablet on all other days.**      . zaleplon (SONATA) 5 MG capsule 1 for sleep as needed, no less than 3 hours before morning  30 capsule  5  . acetaminophen (TYLENOL) 500 MG tablet Take 500 mg by mouth every 6 (six) hours as needed. For headache         Assessment: Okay for Protocol INR Therapuetic  Goal of Therapy:  INR 2-3   Plan:  Warfarin 5mg  PO Tonight. Daily PT/INR  Mady Gemma 02/02/2013,7:26 PM

## 2013-02-02 NOTE — H&P (Signed)
[facility] History and Physical  PEIGHTON EDGIN ZOX:[id number] DOB: Sep 22, 1928 DOA: 02/02/2013  Referring physician: Cardiologist, Dr. Purvis Sheffield PCP: Carylon Perches, MD  Specialists: [facility]  Chief Complaint: Lower extremity swelling  HPI: Mario Cline is a 79 y.o. male with a history of nonischemic cardiomyopathy, ejection fraction of 15% per 2-D echocardiogram in May 2013, status post ICD placement, who presents to the hospital as a direct admission from cardiologist, Dr. Purvis Sheffield. The patient has had progressive gradual increase in bilateral leg swelling for the past 3 weeks. Despite increases in his diuretic therapy by cardiology, he has not responded with an increase in urination and/or decrease in bilateral leg swelling. He has also had abdominal fullness as if the fluid has migrated to his abdomen. He denies chest congestion, but on occasion, he has heard wheezing. He is also had a dry cough. He denies associated fever or chills. He has had some shortness of breath, primarily with activity, and some shortness of breath when he lays flat. He denies chest pain or palpitations.  Dr. Purvis Sheffield has titrated Lasix from 40 mg daily to 80 mg twice a day. He had recently started the patient on metolazone at 2.5 mg twice a day as well. The patient had normally taken 40 mg of Lasix and 12.5 mg of spironolactone. Eventually, he increased the patient's Lasix to 80 mg 3 times a day and increased the metolazone to 5 mg twice a day. As stated above, despite these modifications, the patient did not have an increase in urinary frequency and his legs are still markedly swollen.  We are asked to admit the patient for IV diuresis and treatment of presumed acute on chronic systolic congestive heart failure. Laboratory data are pending.   Review of Systems: Review of systems reviewed. Positive as above in history present illness, otherwise negative.  Past Medical History  Diagnosis Date  .  Nonischemic cardiomyopathy     Cath in 1997-nonobstructive disease with normal EF; h/o CHF; EF-45% in '03, 25% in '08, 25-30% in '11;   . Hypertension   . Orthostatic hypotension   . Obstructive sleep apnea 2002    CPAP prescribed  . DJD (degenerative joint disease)   . AICD (automatic cardioverter/defibrillator) present 06/2007    AICD/dual-chamber pacemaker - (St. Jude)- 2/09;   . Atrial fibrillation 04/2009    Rare brief episodes by ICD interrogation until 03/2011->persistent  . Chronic anticoagulation   . Chronic kidney disease, stage III (moderate)     Cardiorenal disease.   Past Surgical History  Procedure Laterality Date  . Tonsillectomy    . Pacemaker insertion    . Cholecystectomy    . Posterior laminectomy / decompression lumbar spine  1985    +fusion; Dr. Hope Pigeon  . Cardiac defibrillator placement  06/2007   Social History: He is married. He has 2 children. He lives in [place]. He is retired. He denies tobacco and illicit drug use. He drinks approximately 0.5 ounces of alcohol per week. He still drives but is limited to in town.   No Known Allergies  Family History  Problem Relation Age of Onset  . Heart failure Mother   . Heart attack Father   . Cancer Brother     x2     Prior to Admission medications   Medication Sig Start Date End Date Taking? Authorizing Provider  acetaminophen (TYLENOL) 500 MG tablet Take 500 mg by mouth every 6 (six) hours as needed. For headache     Historical Provider, MD  carvedilol (COREG) 12.5 MG tablet TAKE 1 TABLET BY MOUTH TWICE DAILY WITH MEALS. 01/26/13   Laqueta Linden, MD  COUMADIN 5 MG tablet TAKE AS DIRECTED. 03/20/12   Kathlen Brunswick, MD  furosemide (LASIX) 80 MG tablet Take 80 mg by mouth 3 (three) times daily. 01/29/13   Laqueta Linden, MD  losartan (COZAAR) 50 MG tablet Take 50 mg by mouth daily.  09/26/12   Kathlen Brunswick, MD  metolazone (ZAROXOLYN) 2.5 MG tablet Take 5 mg by mouth 2 (two) times daily.  01/29/13   Laqueta Linden, MD  spironolactone (ALDACTONE) 25 MG tablet Take 12.5 mg by mouth daily.  10/23/12   Jodelle Gross, NP  tiotropium (SPIRIVA) 18 MCG inhalation capsule Place 1 capsule (18 mcg total) into inhaler and inhale daily. 12/19/12   Waymon Budge, MD  zaleplon (SONATA) 5 MG capsule 1 for sleep as needed, no less than 3 hours before morning 12/19/12   Waymon Budge, MD   Physical Exam: Filed Vitals:   02/02/13 1623  BP: 83/56  Pulse: 86  Temp: 98.2 F (36.8 C)  Resp: 17     General: Pleasant alert 79 year old Caucasian man laying in bed, in no acute distress.  Eyes: Pupils equal, round, and reactive to light. Extraocular movements are intact. Conjunctivae are clear. Sclerae are white.  ENT: Nasal mucosa is dry, no rhinorrhea. Oropharynx reveals moist membranes. No posterior exudates or erythema.  Neck: Supple, no thyromegaly, no JVD.  Cardiovascular: S1, S2, with an ectopic beat versus irregular, irregular. 2-3+ right greater than left bilateral lower extremity edema,pitting. Pedal pulses palpable.  Respiratory: Decreased breath sounds in the bases, otherwise clear. Breathing is nonlabored.  Abdomen: Protuberant, soft, nontender, nondistended. No clear ascites.  Skin: Fair to good turgor. Stasis changes on both legs noted.  Musculoskeletal: No acute hot red joints. Muscle tone within normal limits.  Psychiatric: Pleasant affect. He is alert and oriented x3. Speech is clear.  Neurologic: Cranial nerves II through XII are intact with exception of chronic decrease in hearing acuity.  Labs on Admission:  Basic Metabolic Panel:  Recent Labs Lab 02/01/13 0824  NA 132*  K 4.9  CL 94*  CO2 29  GLUCOSE 97  BUN 57*  CREATININE 1.93*  CALCIUM 8.8   Liver Function Tests: No results found for this basename: AST, ALT, ALKPHOS, BILITOT, PROT, ALBUMIN,  in the last 168 hours No results found for this basename: LIPASE, AMYLASE,  in the last 168  hours No results found for this basename: AMMONIA,  in the last 168 hours CBC: No results found for this basename: WBC, NEUTROABS, HGB, HCT, MCV, PLT,  in the last 168 hours Cardiac Enzymes: No results found for this basename: CKTOTAL, CKMB, CKMBINDEX, TROPONINI,  in the last 168 hours  BNP (last 3 results) No results found for this basename: PROBNP,  in the last 8760 hours CBG: No results found for this basename: GLUCAP,  in the last 168 hours  Radiological Exams on Admission: No results found.  EKG: Pending.   Assessment/Plan Principal Problem:   Acute on chronic systolic congestive heart failure Active Problems:   Nonischemic cardiomyopathy   Automatic implantable cardioverter-defibrillator in situ   Hypotension   Obstructive sleep apnea   Atrial fibrillation   Chronic anticoagulation   Chronic kidney disease, stage III (moderate)   Acute renal insufficiency   COPD (chronic obstructive pulmonary disease)   1. Acute on chronic systolic congestive heart failure. He has an  ejection fraction of 15% per echocardiogram in May 2013. The patient has had progressive symptomatic lower extremity edema despite outpatient titration of diuretic therapy. The patient was discussed with Dr. Purvis Sheffield who made recommendations regarding treatment. Despite the patient relative hypotension,  diuresis should improve the patient's cardiac output and does improve his blood pressure. If he does not have an adequate diuretic response, dobutamine infusion in addition to a Lasix infusion can be considered. 2. Cardiorenal syndrome with acute renal insufficiency superimposed on stage III chronic kidney disease. (Per outpatient laboratory studies ordered yesterday). The patient's baseline creatinine from a few months ago was 1.7, although it has been in the 1.9 range per chart review. Hopefully, with diuresis, his creatinine will return to baseline. His urine output will be measured closely. 3. Hypotension.   The patient's blood pressure is likely chronically low from his low cardiac output state. He is treated chronically with carvedilol, losartan, and diuretics. Losartan and carvedilol will be restarted once his blood pressure improves. He will be held for now. 4. Chronic atrial fibrillation, on chronic Coumadin therapy. We'll continue Coumadin and will consult pharmacy for assistance with Coumadin dosing. 5. Obstructive sleep apnea, on CPAP. 6. COPD, currently stable.     Plan: 1. As stated above and as discussed with Dr. Purvis Sheffield, the patient is being admitted to the step down unit. We'll start IV Lasix at 40 mg Q8 hours. We'll hold losartan and carvedilol until his blood pressure is consistently in the 90+ range systolically. Within restart carvedilol at 3.125 mg daily. We'll hold metolazone and spironolactone for now as discussed. 2.Consider dobutamine and Lasix infusion if there is not a significant diuretic response over the next 24-48 hours. 3. Continue CPAP and Spiriva inhaler. 4. Coumadin per pharmacy. 5. We'll order admission laboratory studies including cardiac enzymes, TSH, CMET, CBC, PT/PTT, ABG, portable chest x-ray, and EKG. 6. Comment catheter for strict ins and outs. Consider indwelling Foley catheter. Daily weights.    Code Status: Full code.  Family Communication: discussed with wife and son.  Disposition Plan: anticipate discharge to home in a few days when clinically improved.   Time spent: 1 hour.   [facility] Pager [phone]  If 7PM-7AM, please contact night-coverage [email or web address] Password [facility] 02/02/2013, 5:37 PM

## 2013-02-03 LAB — BASIC METABOLIC PANEL
BUN: 70 mg/dL — ABNORMAL HIGH (ref 6–23)
CO2: 29 mEq/L (ref 19–32)
Calcium: 9.7 mg/dL (ref 8.4–10.5)
Chloride: 89 mEq/L — ABNORMAL LOW (ref 96–112)
GFR calc non Af Amer: 23 mL/min — ABNORMAL LOW (ref >90)
Glucose, Bld: 93 mg/dL (ref 70–99)
Potassium: 3.7 mEq/L (ref 3.5–5.1)

## 2013-02-03 LAB — PROTIME-INR
INR: 2.31 — ABNORMAL HIGH (ref 0.00–1.49)
Prothrombin Time: 24.6 seconds — ABNORMAL HIGH (ref 11.6–15.2)

## 2013-02-03 LAB — TSH: TSH: 1.219 u[IU]/mL (ref 0.350–4.500)

## 2013-02-03 LAB — CBC
HCT: 38.7 % — ABNORMAL LOW (ref 39.0–52.0)
Hemoglobin: 13.4 g/dL (ref 13.0–17.0)
MCHC: 34.6 g/dL (ref 30.0–36.0)
Platelets: 155 10*3/uL (ref 150–400)
RBC: 4.03 MIL/uL — ABNORMAL LOW (ref 4.22–5.81)

## 2013-02-03 MED ORDER — WARFARIN SODIUM 5 MG PO TABS
5.0000 mg | ORAL_TABLET | Freq: Once | ORAL | Status: AC
  Administered 2013-02-03: 5 mg via ORAL
  Filled 2013-02-03: qty 1

## 2013-02-03 NOTE — Progress Notes (Signed)
ANTICOAGULATION CONSULT NOTE  Pharmacy Consult for Warfarin Indication: atrial fibrillation  No Known Allergies  Patient Measurements: Height: 5\' 9"  (175.3 cm) Weight: 181 lb 14.1 oz (82.5 kg) IBW/kg (Calculated) : 70.7  Vital Signs: Temp: 98.2 F (36.8 C) (09/27 0730) Temp src: Oral (09/27 0730) BP: 93/68 mmHg (09/27 0700) Pulse Rate: 41 (09/27 0700)  Labs:  Recent Labs  02/01/13 0824 02/02/13 1811 02/02/13 1813 02/02/13 2301 02/03/13 0605  HGB  --  13.2  --   --  13.4  HCT  --  38.3*  --   --  38.7*  PLT  --  147*  --   --  155  APTT  --   --  45*  --   --   LABPROT  --   --  25.3*  --  24.6*  INR  --   --  2.39*  --  2.31*  CREATININE 1.93* 2.01*  --   --  2.47*  TROPONINI  --  <0.30  --  <0.30 <0.30    Estimated Creatinine Clearance: 23.1 ml/min (by C-G formula based on Cr of 2.47).   Medical History: Past Medical History  Diagnosis Date  . Nonischemic cardiomyopathy     Cath in 1997-nonobstructive disease with normal EF; h/o CHF; EF-45% in '03, 25% in '08, 25-30% in '11;   . Hypertension   . Orthostatic hypotension   . Obstructive sleep apnea 2002    CPAP prescribed  . DJD (degenerative joint disease)   . AICD (automatic cardioverter/defibrillator) present 06/2007    AICD/dual-chamber pacemaker - (St. Jude)- 2/09;   . Atrial fibrillation 04/2009    Rare brief episodes by ICD interrogation until 03/2011->persistent  . Chronic anticoagulation   . Chronic kidney disease, stage III (moderate)     Cardiorenal disease.    Medications:  Prescriptions prior to admission  Medication Sig Dispense Refill  . carvedilol (COREG) 12.5 MG tablet Take 12.5 mg by mouth 2 (two) times daily with a meal.      . furosemide (LASIX) 80 MG tablet Take 80 mg by mouth 3 (three) times daily.      Marland Kitchen losartan (COZAAR) 100 MG tablet Take 50 mg by mouth every morning.      . metolazone (ZAROXOLYN) 2.5 MG tablet Take 5 mg by mouth 2 (two) times daily.      . Multiple Vitamin  (MULTIVITAMIN WITH MINERALS) TABS tablet Take 1 tablet by mouth every 30 (thirty) days.      Marland Kitchen spironolactone (ALDACTONE) 25 MG tablet Take 12.5 mg by mouth daily.       Marland Kitchen tiotropium (SPIRIVA) 18 MCG inhalation capsule Place 1 capsule (18 mcg total) into inhaler and inhale daily.  30 capsule  6  . warfarin (COUMADIN) 5 MG tablet Take 2.5-5 mg by mouth See admin instructions. **Take one-half tablet on Mondays and Thursdays. Takes one tablet on all other days.**      . zaleplon (SONATA) 5 MG capsule 1 for sleep as needed, no less than 3 hours before morning  30 capsule  5  . acetaminophen (TYLENOL) 500 MG tablet Take 500 mg by mouth every 6 (six) hours as needed. For headache         Assessment: INR Therapeutic, no bleeding noted.  Goal of Therapy:  INR 2-3   Plan:  Warfarin 5mg  PO today. Continue daily PT/INR.  Lamonte Richer R 02/03/2013,8:51 AM

## 2013-02-03 NOTE — Progress Notes (Signed)
Patient was placed on CPAP for OSA ;pt was fitted with a full face mask then patient remembered he wore a nasal mask at home. Patient on room air saturation of 97%.

## 2013-02-04 LAB — BASIC METABOLIC PANEL
BUN: 72 mg/dL — ABNORMAL HIGH (ref 6–23)
Calcium: 10.1 mg/dL (ref 8.4–10.5)
Creatinine, Ser: 2.06 mg/dL — ABNORMAL HIGH (ref 0.50–1.35)
GFR calc Af Amer: 33 mL/min — ABNORMAL LOW (ref >90)
GFR calc non Af Amer: 28 mL/min — ABNORMAL LOW (ref >90)
Glucose, Bld: 97 mg/dL (ref 70–99)
Potassium: 3.3 mEq/L — ABNORMAL LOW (ref 3.5–5.1)

## 2013-02-04 LAB — PROTIME-INR: Prothrombin Time: 23.3 seconds — ABNORMAL HIGH (ref 11.6–15.2)

## 2013-02-04 MED ORDER — WARFARIN SODIUM 5 MG PO TABS
5.0000 mg | ORAL_TABLET | Freq: Once | ORAL | Status: AC
  Administered 2013-02-04: 5 mg via ORAL
  Filled 2013-02-04: qty 1

## 2013-02-04 MED ORDER — CARVEDILOL 12.5 MG PO TABS
12.5000 mg | ORAL_TABLET | Freq: Two times a day (BID) | ORAL | Status: DC
  Administered 2013-02-04 (×2): 12.5 mg via ORAL
  Filled 2013-02-04 (×5): qty 1

## 2013-02-04 MED ORDER — ZOLPIDEM TARTRATE 5 MG PO TABS
5.0000 mg | ORAL_TABLET | Freq: Every evening | ORAL | Status: DC | PRN
  Administered 2013-02-04 – 2013-02-05 (×2): 5 mg via ORAL
  Filled 2013-02-04 (×2): qty 1

## 2013-02-04 NOTE — Progress Notes (Signed)
ANTICOAGULATION CONSULT NOTE  Pharmacy Consult for Warfarin Indication: atrial fibrillation  No Known Allergies  Patient Measurements: Height: 5\' 9"  (175.3 cm) Weight: 170 lb 13.7 oz (77.5 kg) IBW/kg (Calculated) : 70.7  Vital Signs: Temp: 97.5 F (36.4 C) (09/28 0730) Temp src: Oral (09/28 0730) BP: 97/54 mmHg (09/28 0752) Pulse Rate: 106 (09/28 0752)  Labs:  Recent Labs  02/02/13 1811 02/02/13 1813 02/02/13 2301 02/03/13 0605 02/04/13 0458  HGB 13.2  --   --  13.4  --   HCT 38.3*  --   --  38.7*  --   PLT 147*  --   --  155  --   APTT  --  45*  --   --   --   LABPROT  --  25.3*  --  24.6* 23.3*  INR  --  2.39*  --  2.31* 2.15*  CREATININE 2.01*  --   --  2.47* 2.06*  TROPONINI <0.30  --  <0.30 <0.30  --     Estimated Creatinine Clearance: 27.6 ml/min (by C-G formula based on Cr of 2.06).   Assessment: INR Therapeutic, no bleeding noted. Home dose 5mg  daily except 2.5mg  on Mondays and Thursdays.  Goal of Therapy:  INR 2-3   Plan:  Repeat Warfarin 5mg  PO today. Continue daily PT/INR.  Mady Gemma 02/04/2013,9:05 AM

## 2013-02-04 NOTE — Progress Notes (Signed)
NAME:  NAVRAJ, DREIBELBIS NO.:  [id number]  MEDICAL RECORD NO.:  [id number]  LOCATION:  IC09                          FACILITY:  [facility]  PHYSICIAN:  Evangelia Whitaker G. Renard Matter, MD   DATE OF BIRTH:  1928/06/25  DATE OF PROCEDURE: DATE OF DISCHARGE:                                PROGRESS NOTE   This patient was more comfortable this morning.  He has began to diurese.  He does have acute-on-chronic systolic congestive heart failure with edema.  He does have a history of nonischemic cardiomyopathy with very low ejection fraction 15%.  He had increased swelling in his legs over the last few weeks and had not been responding to outpatient diuresis.  PHYSICAL EXAMINATION:  GENERAL:  The patient is alert and oriented. VITAL SIGNS:  Blood pressure 90/57, respirations 14, pulse 83, temperature 98. HEENT:  Eyes PERRLA.  TMs negative.  Oropharynx benign. NECK:  Supple.  Slight distention JVD. Heart.  Regular rhythm. LUNGS:  Diminished breath sounds. ABDOMEN:  Slightly distended. SKIN:  Warm and dry. EXTREMITIES:  Minimal edema in extremities bilaterally.  ASSESSMENT:  The patient does have acute-on-chronic systolic congestive heart failure, history of nonischemic cardiomyopathy, hypotension, atrial fibrillation, chronic kidney disease  stage 3.  PLAN:  Continue to hold losartan and carvedilol.  Continue IV Lasix. Continue to monitor.  Chemistries, daily weights, and INR.     Nyna Chilton G. Renard Matter, MD     AGM/MEDQ  D:  02/03/2013  T:  02/04/2013  Job:  [id number]

## 2013-02-05 LAB — BASIC METABOLIC PANEL
CO2: 32 mEq/L (ref 19–32)
Calcium: 10.2 mg/dL (ref 8.4–10.5)
GFR calc non Af Amer: 27 mL/min — ABNORMAL LOW (ref >90)
Glucose, Bld: 108 mg/dL — ABNORMAL HIGH (ref 70–99)
Sodium: 137 mEq/L (ref 135–145)

## 2013-02-05 LAB — PROTIME-INR: INR: 2 — ABNORMAL HIGH (ref 0.00–1.49)

## 2013-02-05 MED ORDER — TORSEMIDE 20 MG PO TABS
40.0000 mg | ORAL_TABLET | Freq: Two times a day (BID) | ORAL | Status: DC
  Administered 2013-02-05: 40 mg via ORAL
  Filled 2013-02-05 (×2): qty 2

## 2013-02-05 MED ORDER — WARFARIN SODIUM 2.5 MG PO TABS
2.5000 mg | ORAL_TABLET | Freq: Once | ORAL | Status: AC
  Administered 2013-02-05: 2.5 mg via ORAL
  Filled 2013-02-05: qty 1

## 2013-02-05 MED ORDER — POTASSIUM CHLORIDE CRYS ER 20 MEQ PO TBCR
20.0000 meq | EXTENDED_RELEASE_TABLET | Freq: Three times a day (TID) | ORAL | Status: DC
  Administered 2013-02-05 – 2013-02-06 (×4): 20 meq via ORAL
  Filled 2013-02-05 (×4): qty 1

## 2013-02-05 NOTE — Care Management Note (Signed)
    Page 1 of 1   02/06/2013     1:44:55 PM   CARE MANAGEMENT NOTE 02/06/2013  Patient:  Mario Cline, Mario Cline   Account Number:  [id number]  Date Initiated:  02/05/2013  Documentation initiated by:  Rosemary Holms  Subjective/Objective Assessment:   Pt lives at home with his wife. Pt would benefit from home health CHF program. Wife and pt agreeable and receptive.     Action/Plan:   Anticipated DC Date:  02/06/2013   Anticipated DC Plan:  HOME W HOME HEALTH SERVICES      DC Planning Services  CM consult      Choice offered to / List presented to:          [facility] arranged  HH-1 RN  HH-10 DISEASE MANAGEMENT      HH agency  [facility]   Status of service:  Completed, signed off Medicare Important Message given?  YES (If response is "NO", the following Medicare IM given date fields will be blank) Date Medicare IM given:  02/06/2013 Date Additional Medicare IM given:    Discharge Disposition:  HOME W HOME HEALTH SERVICES  Per UR Regulation:    If discussed at Long Length of Stay Meetings, dates discussed:    Comments:  02/06/13 Rosemary Holms RN BSN CM [facility] set up for RN and the "we got heart" program for CHF  02/05/13 Terril Chestnut Leanord Hawking RN BSN CM

## 2013-02-05 NOTE — Progress Notes (Signed)
Pt placed on CPAP from Respiratory Dept. He has his ResMED bag from home but it does not contain his machine or tubing. He at present time is too sleepy to explain.

## 2013-02-05 NOTE — Progress Notes (Signed)
ANTICOAGULATION CONSULT NOTE  Pharmacy Consult for Warfarin Indication: atrial fibrillation  No Known Allergies  Patient Measurements: Height: 5\' 9"  (175.3 cm) Weight: 170 lb 13.7 oz (77.5 kg) IBW/kg (Calculated) : 70.7  Vital Signs: Temp: 98.4 F (36.9 C) (09/29 0444) Temp src: Oral (09/29 0444) BP: 86/62 mmHg (09/29 0758) Pulse Rate: 94 (09/29 0444)  Labs:  Recent Labs  02/02/13 1811  02/02/13 1813 02/02/13 2301 02/03/13 0605 02/04/13 0458 02/05/13 0519  HGB 13.2  --   --   --  13.4  --   --   HCT 38.3*  --   --   --  38.7*  --   --   PLT 147*  --   --   --  155  --   --   APTT  --   --  45*  --   --   --   --   LABPROT  --   < > 25.3*  --  24.6* 23.3* 22.1*  INR  --   < > 2.39*  --  2.31* 2.15* 2.00*  CREATININE 2.01*  --   --   --  2.47* 2.06* 2.15*  TROPONINI <0.30  --   --  <0.30 <0.30  --   --   < > = values in this interval not displayed.  Estimated Creatinine Clearance: 26.5 ml/min (by C-G formula based on Cr of 2.15).  Assessment: INR Therapeutic, no bleeding noted. Home dose 5mg  daily except 2.5mg  on Mondays and Thursdays.  Goal of Therapy:  INR 2-3   Plan:  Warfarin 2.5mg  PO today. (home dose) Continue daily PT/INR.  Valrie Hart A 02/05/2013,8:33 AM

## 2013-02-05 NOTE — Plan of Care (Signed)
Dr. Please address possible DNR status with pt when rounding.  Pt has requested to speak with you about this.

## 2013-02-05 NOTE — Progress Notes (Signed)
UR Chart Review Completed  

## 2013-02-05 NOTE — Clinical Documentation Improvement (Signed)
THIS DOCUMENT IS NOT A PERMANENT PART OF THE MEDICAL RECORD  Please update your documentation within the medical record to reflect your response to this query. If you need help knowing how to do this please call [phone].  02/05/13  Dear Dr. Ouida Sills Marton Redwood A review of the medical record has revealed the following indicators.  Based on your clinical judgment, please clarify and document in a progress note and/or discharge summary the clinical condition associated with the following supporting information:  The medical record reflects the following clinical findings, please clarify the diagnostic and/or clinical significance:    Supporting Information:  Risk Factors: Admitted w/Acute on Chronic systolic chf with Lasix therapy History of CKD stage III & Cardiorenal disease Advanced age of 56  Lab Test: Sodium Normal Values : 135-145  Patient Values:  10/24/12 = 143 11/22/12 = 142 02/01/13 = 132 02/02/13 = 129 02/03/13 = 130 02/04/13 = 134 02/05/13 = 137  Treatment: Monitoring Daily BMP     Possible Clinical Conditions?                                  Hyponatremia              Other Condition              Cannot Clinically Determine               Reviewed: Answered Coder query  Thank You,  Harless Litten  RN Clinical Documentation Specialist: [phone] [facility] Health Information Management [place]

## 2013-02-05 NOTE — Progress Notes (Signed)
Patient using home CPAP unit at this time. RRT pulled hospital unit out of room and cleaned.

## 2013-02-05 NOTE — Progress Notes (Signed)
NAME:  Mario Cline, GHOSH NO.:  [id number]  MEDICAL RECORD NO.:  [id number]  LOCATION:  A322                          FACILITY:  [facility]  PHYSICIAN:  Sydna Brodowski G. Renard Matter, MD   DATE OF BIRTH:  03-12-1929  DATE OF PROCEDURE: DATE OF DISCHARGE:                                PROGRESS NOTE   The patient is fairly comfortable today.  He is began to diurese and has noted significant change in the amount of edema in his lower extremities.  His abdomen is not as distended.  He does have a history of nonischemic cardiomyopathy with low ejection fraction 15%.  OBJECTIVE:  VITAL SIGNS:  Alert patient with BP 92/67, respiration 19, pulse 96, temp 98. HEENT:  Eyes, PERRLA.  TMs negative.  Oropharynx benign. NECK:  Supple.  Slightly distended JVD. HEART:  Regular rhythm. LUNGS:  Diminished breath sounds bilaterally. ABDOMEN:  No palpable organs or masses. SKIN:  Warm and dry. EXTREMITIES:  Did not show edema today.  ASSESSMENT:  The patient does have acute on chronic systolic congestive heart failure, history of nonischemic cardiomyopathy, hypotension, atrial fibrillation, chronic kidney disease stage III.  PLAN:  To continue IV Lasix.  Continue to monitor chemistries, daily weights.  Continue current med.     Nieve Rojero G. Renard Matter, MD     AGM/MEDQ  D:  02/04/2013  T:  02/05/2013  Job:  [id number]

## 2013-02-05 NOTE — Progress Notes (Signed)
[facility] ROUNDING NOTE  Consulting cardiologist: Wyline Mood, MD  Subjective:   Admitted Friday afternoon from our office in the setting of decompensated CHF.    Objective:   Temp:  [97.5 F (36.4 C)-98.4 F (36.9 C)] 98.4 F (36.9 C) (09/29 0444) Pulse Rate:  [73-94] 94 (09/29 0444) Resp:  [16-24] 18 (09/29 0444) BP: (81-92)/(51-66) 86/62 mmHg (09/29 0758) SpO2:  [92 %-97 %] 97 % (09/29 0444) Last BM Date: 02/04/13  Filed Weights   02/03/13 0530 02/04/13 0500 02/04/13 0530  Weight: 181 lb 14.1 oz (82.5 kg) 171 lb 11.8 oz (77.9 kg) 170 lb 13.7 oz (77.5 kg)    Intake/Output Summary (Last 24 hours) at 02/05/13 0901 Last data filed at 02/05/13 0445  Gross per 24 hour  Intake    720 ml  Output   2150 ml  Net  -1430 ml    Telemetry:  NSR with PVC's rates in the 60's-70's.   Exam:  General: No acute distress.  HEENT: Conjunctiva and lids normal, oropharynx clear.  Lungs: Crackles in the left base. No wheezes or coughing.  Cardiac: No elevated JVP or bruits.Distant HS, RRR with occasional irregular systole., no gallop or rub.   Abdomen: Normoactive bowel sounds, nontender, nondistended.  Extremities: No pitting edema, distal pulses full.  Neuropsychiatric: Alert and oriented x3, affect appropriate.   Lab Results:  Basic Metabolic Panel:  Recent Labs Lab 02/03/13 0605 02/04/13 0458 02/05/13 0519  NA 130* 134* 137  K 3.7 3.3* 3.3*  CL 89* 90* 91*  CO2 29 31 32  GLUCOSE 93 97 108*  BUN 70* 72* 82*  CREATININE 2.47* 2.06* 2.15*  CALCIUM 9.7 10.1 10.2    Liver Function Tests:  Recent Labs Lab 02/02/13 1811  AST 23  ALT 14  ALKPHOS 85  BILITOT 1.7*  PROT 6.9  ALBUMIN 4.0    CBC:  Recent Labs Lab 02/02/13 1811 02/03/13 0605  WBC 5.7 5.9  HGB 13.2 13.4  HCT 38.3* 38.7*  MCV 96.7 96.0  PLT 147* 155    Cardiac Enzymes:  Recent Labs Lab 02/02/13 1811 02/02/13 2301 02/03/13 0605  TROPONINI <0.30 <0.30 <0.30    BNP:  Recent  Labs  02/02/13 1811 02/03/13 0605  PROBNP 7763.0* 9243.0*    Coagulation:  Recent Labs Lab 02/03/13 0605 02/04/13 0458 02/05/13 0519  INR 2.31* 2.15* 2.00*    Echo 09/28/2011 Left ventricle: The cavity size was mildly to moderately dilated. Wall thickness was normal. Systolic function was severely reduced. The estimated ejection fraction was 15%. Severe diffuse hypokinesis. - Aortic valve: Moderately calcified annulus. Trileaflet. Trivial regurgitation. - Aorta: The aorta was moderately calcified. - Left atrium: The atrium was mildly to moderately dilated. - Right atrium: The atrium was mildly dilated.     Medications:   Scheduled Medications: . carvedilol  12.5 mg Oral BID WC  . furosemide  40 mg Intravenous Q8H  . potassium chloride  20 mEq Oral TID  . tiotropium  18 mcg Inhalation Daily  . warfarin  2.5 mg Oral Once  . Warfarin - Pharmacist Dosing Inpatient   Does not apply Q24H    Infusions:    PRN Medications: acetaminophen, acetaminophen, benzonatate, HYDROcodone-acetaminophen, levalbuterol, zolpidem   Assessment and Plan:   1. Acute on Chronic Systolic CHF in the setting of NICM with EF of 15%: He has diuresed 11 lbs since admission with improvement in edema and breathing status. He is not requiring O2 presently. I will have him get out of bed  this morning and walk, to ascertain his breathing status with increased activity.  He remains on lasix 40 mg TID, Creatinine 2.15, improved from 2.47. Likely related to cardio-renal syndrome. Will change to home dose of 80 mg TID. Spironolactone is not restarted due to renal insufficiency. Continue carvedilol 12.5 mg BID. No ACE/ARB presently. BP low normal for him.  2. Atrial fibrillation: HR is well controlled. He remains on warfarin with INR this am 2.0.  3. NICM S/P ICD in situ: Recently interrogated on August 25.2014. Functioning appropriately without discharges.   Bettey Mare. Lyman Bishop NP [facility] 02/05/2013, 9:01 AM    Attending Note Patient seen and discussed with NP Lyman Bishop, I agree with her documentation above. 79 yo male history of NICM admitted with ADSHF, failure to response to increasing oral diuretics at home. Significant response to IV diuretics in hospital, he is net negative nearly 9 liters and appears new euvolemic. His significant response to IV loop diuretic suggests that failure of oral therapy likely was due to bioavailability of oral loop diuretic in the setting of chronic renal insufficiency. Recommend oral torsemide which has better oral bioavailability than lasix, will switch to now and monitor response in hospital. Pending response to oral regimen, may be possible for discharge tomorrow with follow up as outpatient.    Dina Rich MD

## 2013-02-06 LAB — BASIC METABOLIC PANEL
CO2: 32 mEq/L (ref 19–32)
Chloride: 89 mEq/L — ABNORMAL LOW (ref 96–112)
Creatinine, Ser: 2.53 mg/dL — ABNORMAL HIGH (ref 0.50–1.35)
GFR calc non Af Amer: 22 mL/min — ABNORMAL LOW (ref >90)
Sodium: 134 mEq/L — ABNORMAL LOW (ref 135–145)

## 2013-02-06 LAB — PROTIME-INR
INR: 1.98 — ABNORMAL HIGH (ref 0.00–1.49)
Prothrombin Time: 21.9 seconds — ABNORMAL HIGH (ref 11.6–15.2)

## 2013-02-06 MED ORDER — TORSEMIDE 20 MG PO TABS: 20.0000 mg | ORAL_TABLET | Freq: Two times a day (BID) | ORAL | Status: DC

## 2013-02-06 MED ORDER — WARFARIN SODIUM 5 MG PO TABS: 5.0000 mg | ORAL_TABLET | Freq: Once | ORAL | Status: DC

## 2013-02-06 NOTE — Progress Notes (Signed)
Patient given d/c instructions. Verbalizes understanding. IV cath removed and intact. Patient getting dressed and waiting to be transported out.

## 2013-02-06 NOTE — Progress Notes (Signed)
Patient refused to use Spiriva inhaler this am, and from documentation has being refusing for several days. Patient stated to me this am " he hasn't used inhaler for 2 weeks and was not going to use anymore, felt as though it made his cough worse and was only given to him as a trial by his physician".

## 2013-02-06 NOTE — Progress Notes (Signed)
[facility] ROUNDING NOTE  Consulting cardiologist: Wyline Mood, MD  Subjective:    He is without complaint of dyspnea or chest discomfort.   Objective:   Temp:  [97.7 F (36.5 C)-98.3 F (36.8 C)] 98.3 F (36.8 C) (09/30 0504) Pulse Rate:  [88-114] 91 (09/30 0554) Resp:  [18-20] 18 (09/30 0504) BP: (63-92)/(43-64) 90/53 mmHg (09/30 0554) SpO2:  [90 %-96 %] 96 % (09/30 0504) Weight:  [164 lb 14.5 oz (74.8 kg)-168 lb 3.4 oz (76.3 kg)] 168 lb 3.4 oz (76.3 kg) (09/30 0504) Last BM Date: 02/04/13  Filed Weights   02/04/13 0530 02/05/13 1332 02/06/13 0504  Weight: 170 lb 13.7 oz (77.5 kg) 164 lb 14.5 oz (74.8 kg) 168 lb 3.4 oz (76.3 kg)    Intake/Output Summary (Last 24 hours) at 02/06/13 0800 Last data filed at 02/06/13 0555  Gross per 24 hour  Intake    240 ml  Output    600 ml  Net   -360 ml    Telemetry: Afib with PVC's.  Exam:  General: No acute distress.  HEENT: Conjunctiva and lids normal, oropharynx clear.  Lungs: Clear to auscultation, nonlabored.  Cardiac: No elevated JVP or bruits. RRR, no gallop or rub.   Abdomen: Normoactive bowel sounds, nontender, nondistended.  Extremities: No pitting edema, distal pulses full.  Neuropsychiatric: Alert and oriented x3, affect appropriate.   Lab Results:  Basic Metabolic Panel:  Recent Labs Lab 02/04/13 0458 02/05/13 0519 02/06/13 0549  NA 134* 137 134*  K 3.3* 3.3* 3.8  CL 90* 91* 89*  CO2 31 32 32  GLUCOSE 97 108* 116*  BUN 72* 82* 91*  CREATININE 2.06* 2.15* 2.53*  CALCIUM 10.1 10.2 9.8    Liver Function Tests:  Recent Labs Lab 02/02/13 1811  AST 23  ALT 14  ALKPHOS 85  BILITOT 1.7*  PROT 6.9  ALBUMIN 4.0    CBC:  Recent Labs Lab 02/02/13 1811 02/03/13 0605  WBC 5.7 5.9  HGB 13.2 13.4  HCT 38.3* 38.7*  MCV 96.7 96.0  PLT 147* 155    Cardiac Enzymes:  Recent Labs Lab 02/02/13 1811 02/02/13 2301 02/03/13 0605  TROPONINI <0.30 <0.30 <0.30    BNP:  Recent Labs  02/02/13 1811 02/03/13 0605  PROBNP 7763.0* 9243.0*    Coagulation:  Recent Labs Lab 02/04/13 0458 02/05/13 0519 02/06/13 0549  INR 2.15* 2.00* 1.98*      Medications:   Scheduled Medications: . carvedilol  12.5 mg Oral BID WC  . potassium chloride  20 mEq Oral TID  . tiotropium  18 mcg Inhalation Daily  . torsemide  40 mg Oral BID  . Warfarin - Pharmacist Dosing Inpatient   Does not apply Q24H      PRN Medications:  acetaminophen, acetaminophen, benzonatate, HYDROcodone-acetaminophen, levalbuterol, zolpidem   Assessment and Plan:   1. Acute on Chronic Systolic CHF in the setting of NICM with EF of 15%: He has diuresed and appears euvolemic. He has been transitioned to oral torsemide, He has been taken off of spironolactone, losartan and metolazone presently due to worsening renal insufficiency creatinine  2.53. Likely is part due to cardiorenal syndrome. Continue coreg. He has an appt with Dr. Purvis Sheffield in 2 weeks with follow up BMET to be completed at that time. Avoid NSAIDs. Would recommend referral to CHF clinic with Dr. Gala Romney in [facility] for assistance with management.   2. Atrial fibrillation: Heart rate is currently controlled with frequent PVC's. Continue warfarin as OP for dosing.   3.NICM  ICD in situ: Continue follow up with Dr. Ladona Ridgel. Most recent evaluation in August of 2014, functioning appropriately.  Bettey Mare. Lyman Bishop NP [facility] 02/06/2013, 8:00 AM  Attending note Patient seen and discussed with NP Lyman Bishop, I agree with her documentation above. Patiently currently euvolemic, probably somewhat on the dry side reflected by his lower bp's and increaesed Cr. Feel patient is ok for discharge, recommend holding his oral diuretics for today and resuming tomorrow. I have spoken with him about checking his weight daily, he is to take torsemide 40mg  bid daily and if he notices a 2 lbs weight gain over a 2-3 day period he can take the metolazone he has  at home 2.5mg  once for that day. He will follow up in clinic in 2 weeks.    Dina Rich MD

## 2013-02-06 NOTE — Progress Notes (Signed)
ANTICOAGULATION CONSULT NOTE  Pharmacy Consult for Warfarin Indication: atrial fibrillation  No Known Allergies  Patient Measurements: Height: 5\' 9"  (175.3 cm) Weight: 168 lb 3.4 oz (76.3 kg) IBW/kg (Calculated) : 70.7  Vital Signs: Temp: 98.3 F (36.8 C) (09/30 0504) Temp src: Oral (09/30 0504) BP: 92/62 mmHg (09/30 0809) Pulse Rate: 99 (09/30 0809)  Labs:  Recent Labs  02/04/13 0458 02/05/13 0519 02/06/13 0549  LABPROT 23.3* 22.1* 21.9*  INR 2.15* 2.00* 1.98*  CREATININE 2.06* 2.15* 2.53*    Estimated Creatinine Clearance: 22.5 ml/min (by C-G formula based on Cr of 2.53).  Assessment: 79 yo M on chronic warfarin for Afib.   Home dose 5mg  daily except 2.5mg  on Mondays and Thursdays. INR was therapeutic on admission, but trending down on home regimen.  It is sub-therapeutic today.   Will continue home regimen today since patient is scheduled to receive higher dose of 5mg , but may need to give a Coumadin "boost" if INR continues to trend down.  No bleeding noted.   Goal of Therapy:  INR 2-3   Plan:  Warfarin 5mg  PO today.   Continue daily PT/INR.  Elson Clan 02/06/2013,8:27 AM

## 2013-02-08 NOTE — Progress Notes (Signed)
NAME:  Mario Cline, Mario Cline NO.:  [id number]  MEDICAL RECORD NO.:  [id number]  LOCATION:                                 FACILITY:  PHYSICIAN:  Kingsley Callander. Ouida Sills, MD       DATE OF BIRTH:  01/23/1929  DATE OF PROCEDURE:  02/05/2013 DATE OF DISCHARGE:  02/06/2013                                PROGRESS NOTE   Mario Cline was admitted Friday through the Cardiology Clinic after having heart failure exacerbation with marked fluid retention in his legs.  He has diuresed well.  His leg edema has resolved.  He is not feeling short of breath now.  I cannot find where his weights are recorded.  He does not know his weight.  He had a negative I and O of 38/80, day before yesterday, and a -14/30 yesterday.  BUN and creatinine have gone up to 70 and 2.47 to 82 and 2.15.  PHYSICAL EXAMINATION:  VITAL SIGNS:  Temperature 98.4, pulse 94, blood pressure 84/55, oxygen saturation 97%. GENERAL:  Alert and in no distress. LUNGS:  Clear. HEART:  Irregularly irregular. ABDOMEN:  Soft and nontender. EXTREMITIES:  Reveal no edema.  IMPRESSION/PLAN: 1. Acute-on-chronic systolic congestive heart failure with an ejection     fraction of approximately 15%, much improved with IV diuresis.  BUN     and creatinine have gone up to 82 and 2.15.  He is hypokalemic at     3.3, will be supplemented orally.  Carvedilol and losartan had been     held.  It appears carvedilol was restarted yesterday.  He is     receiving Lasix 40 mg IV every 8 hours.  Cardiology will see him     again this morning. 2. Chronic kidney disease.  BUN and creatinine are up with diuresis     compared to baseline. 3. Sleep apnea. 4. Chronic AFib.  He is satisfactorily anticoagulated with Coumadin     with an INR of 2.     Kingsley Callander. Ouida Sills, MD     ROF/MEDQ  D:  02/05/2013  T:  02/06/2013  Job:  [id number]

## 2013-02-12 NOTE — Discharge Summary (Signed)
NAME:  JAQUILLE, KAU NO.:  [id number]  MEDICAL RECORD NO.:  [id number]  LOCATION:                                 FACILITY:  PHYSICIAN:  Kingsley Callander. Ouida Sills, MD       DATE OF BIRTH:  10/02/1928  DATE OF ADMISSION:  02/02/2013 DATE OF DISCHARGE:  09/30/2014LH                              DISCHARGE SUMMARY   DISCHARGE DIAGNOSES: 1. Acute on chronic systolic congestive heart failure secondary to     nonischemic cardiomyopathy. 2. Chronic atrial fibrillation. 3. Status post automated implantable cardioverter-defibrillator     placement. 4. Hypertension. 5. Sleep apnea. 6. Chronic kidney disease, stage 3.  DISCHARGE MEDICATIONS: 1. Torsemide 40 mg b.i.d. 2. Carvedilol 12.5 mg b.i.d. 3. Multivitamin daily. 4. Spiriva 18 mcg capsules daily. 5. Coumadin 5 mg daily except 2.5 mg on Monday and Thursday. 6. Sonata 5 mg at bedtime p.r.n.  HOSPITAL COURSE:  This patient is an 79 year old male with nonischemic cardiomyopathy who presented with progressive leg swelling, despite increasing doses of diuretics through the Cardiology Clinic.  The Lasix dose had been increased and Zaroxolyn had been added.  Despite these modifications, he continued to experience progressive edema in his legs. He was hospitalized.  After return visit to the Cardiology Clinic and was started on intravenous Lasix, his edema responded well to IV diuretic therapy.  He was seen in Cardiology consultation.  He had low to low normal blood pressures and losartan, spironolactone, and metolazone were held.  Carvedilol was also intermittently and briefly held, but then readded. His BUN and creatinine increased from 64 and 2.01-91 and 2.53.  Serum potassium dropped to 3.3, but improved to 3.8, with supplementation. His initial BNP was 7763 with a repeat of 9243.  Troponins were negative.  Hemoglobin was normal at 13.4 and ABG revealed a pH of 7.48 with a pCO2 of 30 and pO2 of 97.  INRs were therapeutic.   He was euthyroid with a TSH of 1.2.  His chest x-ray revealed moderate enlargement of the cardiac silhouette.  As noted, he responded well to IV diuretic therapy.  He was modified to oral torsemide.  He will be seen in followup in my office with 1 week. His condition at discharge as much improved.     Kingsley Callander. Ouida Sills, MD     ROF/MEDQ  D:  02/12/2013  T:  02/12/2013  Job:  [id number]

## 2013-02-14 ENCOUNTER — Telehealth: Payer: Self-pay | Admitting: *Deleted

## 2013-02-14 ENCOUNTER — Encounter: Payer: Self-pay | Admitting: Cardiovascular Disease

## 2013-02-14 NOTE — Discharge Summary (Signed)
NAME:  RAYMONDO, GARCIALOPEZ NO.:  [id number]  MEDICAL RECORD NO.:  [id number]  LOCATION:                                 FACILITY:  PHYSICIAN:  Kingsley Callander. Ouida Sills, MD       DATE OF BIRTH:  [birth year]  DATE OF ADMISSION:  02/02/2013 DATE OF DISCHARGE:  09/30/2014LH                              DISCHARGE SUMMARY   ADDENDUM:  DISCHARGE DIAGNOSIS:  Hyponatremia.  Please note that the patient had hyponatremia with serum sodiums improving from 129-137.     Kingsley Callander. Ouida Sills, MD     ROF/MEDQ  D:  02/14/2013  T:  02/14/2013  Job:  [id number]

## 2013-02-14 NOTE — Telephone Encounter (Signed)
[id number]:[id number] incoming labs from [facility]/PCP [facility], noted ordering MD Ouida Sills, labs given to Leda Gauze PAC to advise per abnormal, noted pt needs to start K Dur Take 2 now and then 2 this evening/PM then the pt will need ongoing K Dur one tablet BID, repeat BMET next week, this nurse contacted [facility] nurse to clarify that Dr Ouida Sills has not already advised instructions for this pt abnormal BMET, was advised by nurse Jewel that Dr Ouida Sills has already addressed the abnormal labs and given the pt potassium to take, pt has been made aware, pt has upcoming apt with Dr. Purvis Sheffield 02-21-13, labs to be scanned into pt chart for next week review per collected 02-12-13

## 2013-02-21 ENCOUNTER — Ambulatory Visit (INDEPENDENT_AMBULATORY_CARE_PROVIDER_SITE_OTHER): Payer: Medicare Other | Admitting: Cardiovascular Disease

## 2013-02-21 ENCOUNTER — Encounter: Payer: Self-pay | Admitting: Cardiovascular Disease

## 2013-02-21 VITALS — BP 88/60 | HR 85 | Ht 69.0 in | Wt 168.0 lb

## 2013-02-21 DIAGNOSIS — I428 Other cardiomyopathies: Secondary | ICD-10-CM

## 2013-02-21 DIAGNOSIS — Z7901 Long term (current) use of anticoagulants: Secondary | ICD-10-CM

## 2013-02-21 DIAGNOSIS — I4891 Unspecified atrial fibrillation: Secondary | ICD-10-CM

## 2013-02-21 DIAGNOSIS — Z95810 Presence of automatic (implantable) cardiac defibrillator: Secondary | ICD-10-CM

## 2013-02-21 DIAGNOSIS — I5022 Chronic systolic (congestive) heart failure: Secondary | ICD-10-CM

## 2013-02-21 NOTE — Patient Instructions (Signed)
Your physician recommends that you schedule a follow-up appointment in: 3-4 months with Dr Koneswaran You will receive a reminder letter two months in advance reminding you to call and schedule your appointment. If you don't receive this letter, please contact our office.  Your physician recommends that you continue on your current medications as directed. Please refer to the Current Medication list given to you today.   
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
## 2013-04-02 ENCOUNTER — Ambulatory Visit: Payer: Medicare Other | Admitting: Cardiovascular Disease

## 2013-04-09 ENCOUNTER — Other Ambulatory Visit: Payer: Self-pay | Admitting: Internal Medicine

## 2013-04-09 ENCOUNTER — Encounter (INDEPENDENT_AMBULATORY_CARE_PROVIDER_SITE_OTHER): Payer: Medicare Other | Admitting: *Deleted

## 2013-04-09 DIAGNOSIS — Z95810 Presence of automatic (implantable) cardiac defibrillator: Secondary | ICD-10-CM

## 2013-04-09 DIAGNOSIS — I428 Other cardiomyopathies: Secondary | ICD-10-CM

## 2013-04-15 LAB — MDC_IDC_ENUM_SESS_TYPE_REMOTE
Battery Remaining Longevity: 46 mo
Brady Statistic AP VP Percent: 0 %
Brady Statistic AP VS Percent: 0 %
Brady Statistic AS VS Percent: 82 %
Brady Statistic RA Percent Paced: 1 %
Brady Statistic RV Percent Paced: 9.8 %
Date Time Interrogation Session: 20141201093009
Lead Channel Impedance Value: 300 Ohm
Lead Channel Impedance Value: 340 Ohm
Lead Channel Pacing Threshold Amplitude: 0.5 V
Lead Channel Sensing Intrinsic Amplitude: 0.4 mV
Lead Channel Setting Pacing Amplitude: 2 V
Lead Channel Setting Pacing Amplitude: 2 V
Lead Channel Setting Pacing Pulse Width: 0.5 ms
Zone Setting Detection Interval: 250 ms
Zone Setting Detection Interval: 300 ms

## 2013-04-17 ENCOUNTER — Encounter: Payer: Self-pay | Admitting: Cardiology

## 2013-04-24 ENCOUNTER — Encounter: Payer: Self-pay | Admitting: *Deleted

## 2013-05-02 ENCOUNTER — Encounter: Payer: Self-pay | Admitting: Internal Medicine

## 2013-05-29 ENCOUNTER — Ambulatory Visit (HOSPITAL_COMMUNITY)
Admission: RE | Admit: 2013-05-29 | Discharge: 2013-05-29 | Disposition: A | Discharge status: Home / Self Care | Payer: Medicare Other | Source: Ambulatory Visit | Attending: Internal Medicine | Admitting: Internal Medicine

## 2013-05-29 VITALS — BP 88/60 | HR 88 | Wt 179.2 lb

## 2013-05-29 DIAGNOSIS — I5022 Chronic systolic (congestive) heart failure: Secondary | ICD-10-CM | POA: Insufficient documentation

## 2013-05-29 DIAGNOSIS — I4891 Unspecified atrial fibrillation: Secondary | ICD-10-CM

## 2013-05-29 DIAGNOSIS — N184 Chronic kidney disease, stage 4 (severe): Secondary | ICD-10-CM | POA: Insufficient documentation

## 2013-05-29 MED ORDER — METOLAZONE 2.5 MG PO TABS: 2.5000 mg | ORAL_TABLET | ORAL | Status: DC

## 2013-05-29 NOTE — Progress Notes (Signed)
Patient ID: RAMAJ FRANGOS, male   DOB: 05-28-1928, 80 y.o.   MRN: [id number]   PCP: Asencion Noble  ADVANCED HF CLINIC NOTE  SUBJECTIVE: Mr. Cerro is an 80 y/o male referred to the HF Clinic by Dr. Bronson Ing for further evaluation of advanced HF.   He has a h/o HTN, CKD (Cr 2.1-2.5), chronic AF on coumadin, OSA on CPAP and CHF due to NICM EF 15% s/p SJ ICD (cath 1997 with minimal non-obstructive CAD).   He was hospitalized in September 2014 with ADHF. Diuresed ~ 25 pounds. D/c weight ~ 168 pounds (163 at home). He is here with his wife. Functional capacity is quite poor. Says he gets very tired walking 40 feet to the mailbox. Denies orthopnea or PND.  Struggling with edema and low BP.  Switched from lasix to torsemide in the Fall.    Weighing every day at home and says it keeps going up slowly. Since discharge weight has crept up from 163->174 pounds. Occasionally takes extra diuretics to try to push it back down. SBP runs about 90. Very careful about salt intake.   ECHO 5/13 - Left ventricle: The cavity size was mildly to moderately dilated. Wall thickness was normal. Systolic function was severely reduced. The estimated ejection fraction was 15%. Severe diffuse hypokinesis. - Aortic valve: Moderately calcified annulus. Trileaflet. Trivial regurgitation. - Aorta: The aorta was moderately calcified. - Left atrium: The atrium was mildly to moderately dilated. - Right atrium: The atrium was mildly dilated.    Past Medical History  Diagnosis Date  . Nonischemic cardiomyopathy     Cath in 1997-nonobstructive disease with normal EF; h/o CHF; EF-45% in '03, 25% in '08, 25-30% in '11;   . Hypertension   . Orthostatic hypotension   . Obstructive sleep apnea 2002    CPAP prescribed  . DJD (degenerative joint disease)   . AICD (automatic cardioverter/defibrillator) present 06/2007    AICD/dual-chamber pacemaker - (St. Jude)- 2/09;   . Atrial fibrillation 04/2009    Rare brief episodes by ICD  interrogation until 03/2011->persistent  . Chronic anticoagulation   . Chronic kidney disease, stage III (moderate)     Cardiorenal disease.    No Known Allergies  Current Outpatient Prescriptions  Medication Sig Dispense Refill  . allopurinol (ZYLOPRIM) 100 MG tablet Take 100 mg by mouth daily.      . carvedilol (COREG) 12.5 MG tablet Take 6.25 mg by mouth 2 (two) times daily with a meal.       . colchicine 0.6 MG tablet Take 0.6 mg by mouth daily.      . fluticasone (VERAMYST) 27.5 MCG/SPRAY nasal spray Place 2 sprays into the nose daily.      . potassium chloride SA (K-DUR,KLOR-CON) 20 MEQ tablet Take 20 mEq by mouth daily.       Marland Kitchen torsemide (DEMADEX) 20 MG tablet Take 40 mg by mouth 2 (two) times daily.      Marland Kitchen warfarin (COUMADIN) 5 MG tablet Take 2.5-5 mg by mouth See admin instructions. **Take one-half tablet on Mondays and Thursdays. Takes one tablet on all other days.**      . zaleplon (SONATA) 5 MG capsule 1 for sleep as needed, no less than 3 hours before morning  30 capsule  5   No current facility-administered medications for this encounter.    Past Medical History  Diagnosis Date  . Nonischemic cardiomyopathy     Cath in 1997-nonobstructive disease with normal EF; h/o CHF; EF-45% in '03, 25%  in '08, 25-30% in '11;   . Hypertension   . Orthostatic hypotension   . Obstructive sleep apnea 2002    CPAP prescribed  . DJD (degenerative joint disease)   . AICD (automatic cardioverter/defibrillator) present 06/2007    AICD/dual-chamber pacemaker - (St. Jude)- 2/09;   . Atrial fibrillation 04/2009    Rare brief episodes by ICD interrogation until 03/2011->persistent  . Chronic anticoagulation   . Chronic kidney disease, stage III (moderate)     Cardiorenal disease.    Past Surgical History  Procedure Laterality Date  . Tonsillectomy    . Pacemaker insertion    . Cholecystectomy    . Posterior laminectomy / decompression lumbar spine  1985    +fusion; Dr. Pearlie Oyster  .  Cardiac defibrillator placement  06/2007    History   Social History  . Marital Status: Married    Spouse Name: N/A    Number of Children: 2  . Years of Education: N/A   Occupational History  . Retired     Insurance account manager   Social History Main Topics  . Smoking status: Never Smoker   . Smokeless tobacco: Never Used  . Alcohol Use: 0.5 oz/week    1 drink(s) per week  . Drug Use: No  . Sexual Activity: No   Other Topics Concern  . Not on file   Social History Narrative  . No narrative on file     Filed Vitals:   05/29/13 0958  BP: 88/60  Pulse: 88  Weight: 179 lb 4 oz (81.307 kg)  SpO2: 100%    PHYSICAL EXAM General: Elderly frail. NAD Neck: JVP to jaw, no thyromegaly or thyroid nodule.  Lungs: Clear to auscultation bilaterally with normal respiratory effort. CV: Laterally displaced PMI.  Heart irregularly irregular, normal S1/S2, +S3/no S4, 3/6 MR    Abdomen: Soft, nontender, no hepatosplenomegaly, + distention.  Neurologic: Alert and oriented x 3.  Psych: Normal affect. Extremities: No clubbing or cyanosis. 2-3+ edema  ECG: AF 91 IVCD 14ms  +PVCs    ASSESSMENT AND PLAN 1. Chronic systolic heart failure WEF 15% due to NICM. Last echo 5/13 2. Chronic Atrial fibrillation: rate controlled, on warfarin. 3. CKD, stage IV  I had along talk with Mr. Kirksey and his wife. He clearly has advanced/end-stage HF due to a severe NICM with signfiicant renal failure. Currently NYHA IIIB. Volume status is markedly elevated. We had a frank talk that he has a 50% mortality at one year and there is little we can do to change this particularly with his BP being so low. We did discuss that we will focus on aggressive symptom management. On exam, he is markedly volume overloaded, we will add metolazone 2.5mg  M & F. We will follow renal function and electrolytes very closely. We discussed Code Status and we will revisit this next time. I recommend limited code  status with no CPR or intubation (shock or inotropes OK). We also discussed the possibility of home intoropes if symptoms get much worse. We will see back in 2 weeks. May be worth repeating his echo at some point.   Krishawn Vanderweele,MD 10:53 AM

## 2013-05-29 NOTE — Addendum Note (Signed)
Encounter addended by: Scarlette Calico, RN on: 05/29/2013 11:04 AM<BR>     Documentation filed: Patient Instructions Section, Orders

## 2013-05-29 NOTE — Patient Instructions (Signed)
Start Metolazone 2.5 mg take 1 tab today and then 1 tab every Mon and Fri  Labs on Friday at Dr Ria Comment  Your physician recommends that you schedule a follow-up appointment in: 2 weeks

## 2013-06-04 ENCOUNTER — Ambulatory Visit (INDEPENDENT_AMBULATORY_CARE_PROVIDER_SITE_OTHER): Payer: Medicare Other | Admitting: Cardiovascular Disease

## 2013-06-04 ENCOUNTER — Encounter: Payer: Self-pay | Admitting: Cardiovascular Disease

## 2013-06-04 VITALS — BP 92/58 | HR 97 | Ht 69.0 in | Wt 164.8 lb

## 2013-06-04 DIAGNOSIS — Z95810 Presence of automatic (implantable) cardiac defibrillator: Secondary | ICD-10-CM

## 2013-06-04 DIAGNOSIS — I428 Other cardiomyopathies: Secondary | ICD-10-CM

## 2013-06-04 DIAGNOSIS — I4891 Unspecified atrial fibrillation: Secondary | ICD-10-CM

## 2013-06-04 DIAGNOSIS — I5022 Chronic systolic (congestive) heart failure: Secondary | ICD-10-CM

## 2013-06-04 DIAGNOSIS — I1310 Hypertensive heart and chronic kidney disease without heart failure, with stage 1 through stage 4 chronic kidney disease, or unspecified chronic kidney disease: Secondary | ICD-10-CM

## 2013-06-04 DIAGNOSIS — Z7901 Long term (current) use of anticoagulants: Secondary | ICD-10-CM

## 2013-06-04 NOTE — Progress Notes (Signed)
Patient ID: Mario Cline, male   DOB: 17-Dec-1928, 80 y.o.   MRN: [id number]      SUBJECTIVE: The patient presents for routine cardiovascular followup. He was recently evaluated by Dr. Haroldine Laws in the heart failure clinic. He is feeling less weak now that he has lost a considerable amount of weight with the addition of metolazone. He is now 164 pounds which is essentially near his dry weight. His wife has several questions, but these are more related to arthritis and nasal congestion. They have been monitoring his blood pressure and heart rate faithfully, and he continues to remain hypotensive with a resting heart rate in the 80-90 beats per minute range. He reportedly underwent blood tests at his PCPs office recently, but they are unavailable to me at this time.    No Known Allergies  Current Outpatient Prescriptions  Medication Sig Dispense Refill  . allopurinol (ZYLOPRIM) 100 MG tablet Take 100 mg by mouth daily.      . carvedilol (COREG) 12.5 MG tablet Take 6.25 mg by mouth 2 (two) times daily with a meal.       . colchicine 0.6 MG tablet Take 0.6 mg by mouth daily.      . fluticasone (FLONASE) 50 MCG/ACT nasal spray       . fluticasone (VERAMYST) 27.5 MCG/SPRAY nasal spray Place 2 sprays into the nose daily.      . metolazone (ZAROXOLYN) 2.5 MG tablet Take 1 tablet (2.5 mg total) by mouth 2 (two) times a week. EVERY MON AND FRI  10 tablet  3  . potassium chloride SA (K-DUR,KLOR-CON) 20 MEQ tablet Take 20 mEq by mouth daily.       Marland Kitchen torsemide (DEMADEX) 20 MG tablet Take 40 mg by mouth 2 (two) times daily.      Marland Kitchen warfarin (COUMADIN) 5 MG tablet Take 2.5-5 mg by mouth See admin instructions. **Take one-half tablet on Mondays and Thursdays. Takes one tablet on all other days.**      . zaleplon (SONATA) 5 MG capsule 1 for sleep as needed, no less than 3 hours before morning  30 capsule  5   No current facility-administered medications for this visit.    Past Medical History  Diagnosis  Date  . Nonischemic cardiomyopathy     Cath in 1997-nonobstructive disease with normal EF; h/o CHF; EF-45% in '03, 25% in '08, 25-30% in '11;   . Hypertension   . Orthostatic hypotension   . Obstructive sleep apnea 2002    CPAP prescribed  . DJD (degenerative joint disease)   . AICD (automatic cardioverter/defibrillator) present 06/2007    AICD/dual-chamber pacemaker - (St. Jude)- 2/09;   . Atrial fibrillation 04/2009    Rare brief episodes by ICD interrogation until 03/2011->persistent  . Chronic anticoagulation   . Chronic kidney disease, stage III (moderate)     Cardiorenal disease.    Past Surgical History  Procedure Laterality Date  . Tonsillectomy    . Pacemaker insertion    . Cholecystectomy    . Posterior laminectomy / decompression lumbar spine  1985    +fusion; Dr. Pearlie Oyster  . Cardiac defibrillator placement  06/2007    History   Social History  . Marital Status: Married    Spouse Name: N/A    Number of Children: 2  . Years of Education: N/A   Occupational History  . Retired     Insurance account manager   Social History Main Topics  . Smoking status: Never Smoker   .  Smokeless tobacco: Never Used  . Alcohol Use: 0.5 oz/week    1 drink(s) per week  . Drug Use: No  . Sexual Activity: No   Other Topics Concern  . Not on file   Social History Narrative  . No narrative on file     Filed Vitals:   06/04/13 1129  BP: 92/58  Pulse: 97  Height: 5\' 9"  (1.753 m)  Weight: 164 lb 12 oz (74.73 kg)    PHYSICAL EXAM General: NAD Neck: JVP 8-9 cm, no thyromegaly or thyroid nodule.  Lungs: Clear to auscultation bilaterally with normal respiratory effort. CV: Heart irregular, normal S1/S2, +S3, III/VI apical holosystolic murmur.  1+ peripheral leg edema.  No carotid bruit.  Normal pedal pulses.  Abdomen: firm, nontender, no hepatosplenomegaly,  + distention.  Neurologic: Alert and oriented x 3.  Psych: Normal affect. Extremities: No clubbing or  cyanosis.   ECG: reviewed and available in electronic records.      ASSESSMENT AND PLAN: 1. Chronic systolic heart failure, IO03%, due to NICM. Last echo 5/13. Was recently started on metolazone on Mon and Fri by Dr. Haroldine Laws. Wt 164 lbs today. Remains hypotensive with HR 90 bpm range. Unable to increase Coreg dose.  2. Chronic atrial fibrillation: rate controlled, on warfarin.  3. CKD, stage IV: will obtain results from PCP's office with respect to recent blood work.  Dispo: sees Dr. Haroldine Laws on 2/3, I will f/u with him in 8-10 weeks.  Kate Sable, M.D., F.A.C.C.

## 2013-06-04 NOTE — Patient Instructions (Signed)
Your physician recommends that you schedule a follow-up appointment in: 8-10 weeks with Dr Bronson Ing

## 2013-06-06 ENCOUNTER — Telehealth (HOSPITAL_COMMUNITY): Payer: Self-pay

## 2013-06-06 MED ORDER — POTASSIUM CHLORIDE CRYS ER 20 MEQ PO TBCR: EXTENDED_RELEASE_TABLET | ORAL | Status: DC

## 2013-06-06 NOTE — Telephone Encounter (Signed)
Patient's wife called regarding lab results, instructed to take extra 40 (2 tablets) more of potassium today on top of normal 20 meq dose (3 tabs total for today), then start taking 40 meq on mondays and Friday once daily, and all other days keep 20 meq daily.  Wife aware and agreeable, Rx with new instructions sent electronically to patient preferred pharmacy..  Reminded of upcoming appointment, advised to call sooner if needed for questions/concerns. [name]

## 2013-06-07 ENCOUNTER — Other Ambulatory Visit (HOSPITAL_COMMUNITY): Payer: Self-pay

## 2013-06-07 MED ORDER — ALLOPURINOL 100 MG PO TABS: 100.0000 mg | ORAL_TABLET | Freq: Every day | ORAL | Status: DC

## 2013-06-07 MED ORDER — CARVEDILOL 6.25 MG PO TABS: 6.2500 mg | ORAL_TABLET | Freq: Two times a day (BID) | ORAL | Status: DC

## 2013-06-12 ENCOUNTER — Encounter (HOSPITAL_COMMUNITY): Payer: Self-pay

## 2013-06-12 ENCOUNTER — Ambulatory Visit (HOSPITAL_COMMUNITY)
Admission: RE | Admit: 2013-06-12 | Discharge: 2013-06-12 | Disposition: A | Discharge status: Home / Self Care | Payer: Medicare Other | Source: Ambulatory Visit | Attending: Internal Medicine | Admitting: Internal Medicine

## 2013-06-12 VITALS — BP 108/70 | HR 88 | Resp 16 | Wt 164.0 lb

## 2013-06-12 DIAGNOSIS — I509 Heart failure, unspecified: Secondary | ICD-10-CM | POA: Insufficient documentation

## 2013-06-12 DIAGNOSIS — G4733 Obstructive sleep apnea (adult) (pediatric): Secondary | ICD-10-CM | POA: Insufficient documentation

## 2013-06-12 DIAGNOSIS — Z79899 Other long term (current) drug therapy: Secondary | ICD-10-CM | POA: Insufficient documentation

## 2013-06-12 DIAGNOSIS — Z7901 Long term (current) use of anticoagulants: Secondary | ICD-10-CM | POA: Insufficient documentation

## 2013-06-12 DIAGNOSIS — I428 Other cardiomyopathies: Secondary | ICD-10-CM | POA: Insufficient documentation

## 2013-06-12 DIAGNOSIS — N183 Chronic kidney disease, stage 3 unspecified: Secondary | ICD-10-CM

## 2013-06-12 DIAGNOSIS — I129 Hypertensive chronic kidney disease with stage 1 through stage 4 chronic kidney disease, or unspecified chronic kidney disease: Secondary | ICD-10-CM | POA: Insufficient documentation

## 2013-06-12 DIAGNOSIS — Z95810 Presence of automatic (implantable) cardiac defibrillator: Secondary | ICD-10-CM | POA: Insufficient documentation

## 2013-06-12 DIAGNOSIS — I4891 Unspecified atrial fibrillation: Secondary | ICD-10-CM | POA: Insufficient documentation

## 2013-06-12 DIAGNOSIS — I2510 Atherosclerotic heart disease of native coronary artery without angina pectoris: Secondary | ICD-10-CM | POA: Insufficient documentation

## 2013-06-12 DIAGNOSIS — N184 Chronic kidney disease, stage 4 (severe): Secondary | ICD-10-CM | POA: Insufficient documentation

## 2013-06-12 DIAGNOSIS — I5022 Chronic systolic (congestive) heart failure: Secondary | ICD-10-CM | POA: Insufficient documentation

## 2013-06-12 NOTE — Progress Notes (Addendum)
Patient ID: Mario Cline, male   DOB: [birth year], 80 y.o.   MRN: [id number]   PCP: Asencion Noble Cardiologist: Dr Bronson Ing  ADVANCED HF CLINIC NOTE  SUBJECTIVE: Mario Cline is an 80 y/o male has a h/o HTN, CKD (Cr 2.1-2.5), chronic AF on coumadin, OSA on CPAP and CHF due to NICM EF 15% s/p SJ ICD (cath 1997 with minimal non-obstructive CAD).   He was hospitalized in September 2014 with ADHF. Diuresed ~ 25 pounds. D/c weight ~ 168 pounds (163 at home).   He returns for follow up. Last visit he was started on Metolazone 2.5 mg twice a week. Weight went down about 15 pounds. Overall he feels better. Weight at home trending down from 179 to 159.  Able to walk 100 feet . Complains of leg fatigue. Denies SOB/PND. Sleeps on 2 pillows + Orthopnea. + Bendopnea.  Lower extremity edema improving. Appetite good. Using CPAP. Compliant with medications.   Labs 06/04/13 K 3.2 Creatinine 2.09  ECHO 09/20/2011 - Left ventricle: The cavity size was mildly to moderately dilated. Wall thickness was normal. Systolic function was severely reduced. The estimated ejection fraction was 15%. Severe diffuse hypokinesis. - Aortic valve: Moderately calcified annulus. Trileaflet. Trivial regurgitation. - Aorta: The aorta was moderately calcified. - Left atrium: The atrium was mildly to moderately dilated. - Right atrium: The atrium was mildly dilated.    Past Medical History  Diagnosis Date  . Nonischemic cardiomyopathy     Cath in 1997-nonobstructive disease with normal EF; h/o CHF; EF-45% in '03, 25% in '08, 25-30% in '11;   . Hypertension   . Orthostatic hypotension   . Obstructive sleep apnea 2002    CPAP prescribed  . DJD (degenerative joint disease)   . AICD (automatic cardioverter/defibrillator) present 06/2007    AICD/dual-chamber pacemaker - (St. Jude)- 2/09;   . Atrial fibrillation 04/2009    Rare brief episodes by ICD interrogation until 03/2011->persistent  . Chronic anticoagulation   . Chronic  kidney disease, stage III (moderate)     Cardiorenal disease.    No Known Allergies  Current Outpatient Prescriptions  Medication Sig Dispense Refill  . allopurinol (ZYLOPRIM) 100 MG tablet Take 1 tablet (100 mg total) by mouth daily.  30 tablet  3  . carvedilol (COREG) 6.25 MG tablet Take 1 tablet (6.25 mg total) by mouth 2 (two) times daily with a meal.  60 tablet  3  . colchicine 0.6 MG tablet Take 0.6 mg by mouth daily.      . fluticasone (FLONASE) 50 MCG/ACT nasal spray       . fluticasone (VERAMYST) 27.5 MCG/SPRAY nasal spray Place 2 sprays into the nose daily.      . metolazone (ZAROXOLYN) 2.5 MG tablet Take 1 tablet (2.5 mg total) by mouth 2 (two) times a week. EVERY MON AND FRI  10 tablet  3  . potassium chloride SA (K-DUR,KLOR-CON) 20 MEQ tablet Sunday, Tuesday, Wednesday, Thursday, Saturday take 20 meq (1 tab) once a day.Marland KitchenMarland KitchenMonday and Friday take 40 meq (2 tabs) once a day  40 tablet  3  . torsemide (DEMADEX) 20 MG tablet Take 40 mg by mouth 2 (two) times daily.      Marland Kitchen warfarin (COUMADIN) 5 MG tablet Take 2.5-5 mg by mouth See admin instructions. **Take one-half tablet on Mondays and Thursdays. Takes one tablet on all other days.**      . zaleplon (SONATA) 5 MG capsule 1 for sleep as needed, no less than 3 hours before morning  30 capsule  5   No current facility-administered medications for this encounter.    Past Medical History  Diagnosis Date  . Nonischemic cardiomyopathy     Cath in 1997-nonobstructive disease with normal EF; h/o CHF; EF-45% in '03, 25% in '08, 25-30% in '11;   . Hypertension   . Orthostatic hypotension   . Obstructive sleep apnea 2002    CPAP prescribed  . DJD (degenerative joint disease)   . AICD (automatic cardioverter/defibrillator) present 06/2007    AICD/dual-chamber pacemaker - (St. Jude)- 2/09;   . Atrial fibrillation 04/2009    Rare brief episodes by ICD interrogation until 03/2011->persistent  . Chronic anticoagulation   . Chronic kidney  disease, stage III (moderate)     Cardiorenal disease.    Past Surgical History  Procedure Laterality Date  . Tonsillectomy    . Pacemaker insertion    . Cholecystectomy    . Posterior laminectomy / decompression lumbar spine  1985    +fusion; Dr. Pearlie Oyster  . Cardiac defibrillator placement  06/2007    History   Social History  . Marital Status: Married    Spouse Name: N/A    Number of Children: 2  . Years of Education: N/A   Occupational History  . Retired     Insurance account manager   Social History Main Topics  . Smoking status: Never Smoker   . Smokeless tobacco: Never Used  . Alcohol Use: 0.5 oz/week    1 drink(s) per week  . Drug Use: No  . Sexual Activity: No   Other Topics Concern  . Not on file   Social History Narrative  . No narrative on file     Filed Vitals:   06/12/13 0950  BP: 108/70  Pulse: 88  Resp: 16  Weight: 164 lb (74.39 kg)  SpO2: 100%    PHYSICAL EXAM General: Elderly frail. NAD Wife and 2 sons present.  Neck: JVP 6-7 no thyromegaly or thyroid nodule.  Lungs: Clear to auscultation bilaterally with normal respiratory effort. CV: Laterally displaced PMI.  Heart irregularly irregular, normal S1/S2, +S3, 3/6 MR    Abdomen: Soft, nontender, no hepatosplenomegaly, nondistended.  Neurologic: Alert and oriented x 3.  Psych: Normal affect. Extremities: No clubbing or cyanosis. No lower extremity edema.      ASSESSMENT AND PLAN 1. Chronic systolic heart failure EF 15% due to NICM. Last echo 5/13 NYHA IIIb. Volume status improved after the addition of Metolazone. Continue torsemide 40 mg twice a day and Metoalzone twice a week. Instructed to hold torsemide if weight is less than 156 pounds. Instructed to take an additional 20 mg torsemide if weight 163 or greater.  Continue current dose of carvedilol 6.25 mg twice a day. He is not on ace/dig/spiro due to CKD. BP to soft for hydralazine/imdur.  Discussed the need for possible  RHC if his functional status declines and that he may need inotropes at some point.  Instructed to call HF clinic with functional decline.  Lengthy discussion him and his wife regarding limiting fluid intake to < 2 liters per day and low salt food choices.  Consider repeat ECHO at next visit. Will need to meed with HF SW for goals of care. Plan on discussing at next visit.  PCP to repeat BMET next week and fax results.  2. Chronic Atrial fibrillation: rate controlled, on warfarin. 3. CKD, stage IV Creatinine baseline  4. OSA- continue nightly CPAP.    Follow up in 3--4 weeks with Dr  Bensimhon.    Crystin Lechtenberg NP-C 10:44 AM

## 2013-06-12 NOTE — Patient Instructions (Signed)
Follow up in 3-4 weeks with Dr Haroldine Laws  Hold Torsemide and Metolazone if your weight is less than 156 pounds  Take an extra 20 mg of Torsemide if your weight is 163 pounds or greater and an extra 20 meq of potassium   Call HF clinic if you notice increased fatigue or shortness of breath.   Do the following things EVERYDAY: 1) Weigh yourself in the morning before breakfast. Write it down and keep it in a log. 2) Take your medicines as prescribed 3) Eat low salt foods-Limit salt (sodium) to 2000 mg per day.  4) Stay as active as you can everyday Limit all fluids for the day to less than 2 liters

## 2013-06-14 ENCOUNTER — Ambulatory Visit (INDEPENDENT_AMBULATORY_CARE_PROVIDER_SITE_OTHER): Payer: Medicare Other | Admitting: Otolaryngology

## 2013-06-14 DIAGNOSIS — J310 Chronic rhinitis: Secondary | ICD-10-CM

## 2013-06-14 DIAGNOSIS — J342 Deviated nasal septum: Secondary | ICD-10-CM

## 2013-06-20 ENCOUNTER — Telehealth (HOSPITAL_COMMUNITY): Payer: Self-pay

## 2013-06-20 NOTE — Telephone Encounter (Signed)
Attempted to call patient regarding lab work including low potassium level, will reattempt in the morning to take an extra 40 meq potassium. [name]

## 2013-06-21 ENCOUNTER — Telehealth (HOSPITAL_COMMUNITY): Payer: Self-pay

## 2013-06-21 ENCOUNTER — Encounter (HOSPITAL_COMMUNITY): Payer: Self-pay

## 2013-06-21 NOTE — Telephone Encounter (Signed)
3rd attempt to reach patient regarding lab work and 1 x extra dose of potassium.  Will send letter asking to contact clinic.

## 2013-06-22 ENCOUNTER — Telehealth (HOSPITAL_COMMUNITY): Payer: Self-pay

## 2013-06-22 NOTE — Telephone Encounter (Signed)
4th attempt to reach patient regarding lab work and extra potassium.  Was able to get in touch with wife who received message of lab results and for patient to take 1 extra potassium tablet today.  Agreeable and appreciative of call. [name]

## 2013-06-25 ENCOUNTER — Telehealth: Payer: Self-pay | Admitting: Internal Medicine

## 2013-06-25 MED ORDER — ZALEPLON 5 MG PO CAPS: ORAL_CAPSULE | ORAL | Status: DC

## 2013-06-25 NOTE — Telephone Encounter (Signed)
Ok to refill 6 months 

## 2013-06-25 NOTE — Telephone Encounter (Signed)
Last refill or [facility] was 12/19/12 #30 x 5 refills Last OV 12/19/13 Pending 12/2013 Please advise Dr. Annamaria Boots thanks

## 2013-06-25 NOTE — Telephone Encounter (Signed)
Pt is aware. RX called in.  

## 2013-06-29 ENCOUNTER — Encounter: Payer: Self-pay | Admitting: Internal Medicine

## 2013-07-09 ENCOUNTER — Ambulatory Visit (HOSPITAL_COMMUNITY)
Admission: RE | Admit: 2013-07-09 | Discharge: 2013-07-09 | Disposition: A | Discharge status: Home / Self Care | Payer: Medicare Other | Source: Ambulatory Visit | Attending: Internal Medicine | Admitting: Internal Medicine

## 2013-07-09 ENCOUNTER — Encounter (HOSPITAL_COMMUNITY): Payer: Self-pay

## 2013-07-09 VITALS — BP 102/58 | HR 66 | Resp 18 | Wt 163.5 lb

## 2013-07-09 DIAGNOSIS — I5022 Chronic systolic (congestive) heart failure: Secondary | ICD-10-CM | POA: Insufficient documentation

## 2013-07-09 LAB — BASIC METABOLIC PANEL
BUN: 83 mg/dL — ABNORMAL HIGH (ref 6–23)
CALCIUM: 9.7 mg/dL (ref 8.4–10.5)
CO2: 28 meq/L (ref 19–32)
CREATININE: 1.74 mg/dL — AB (ref 0.50–1.35)
Chloride: 96 mEq/L (ref 96–112)
GFR calc non Af Amer: 35 mL/min — ABNORMAL LOW (ref >90)
GFR, EST AFRICAN AMERICAN: 40 mL/min — AB (ref >90)
Glucose, Bld: 116 mg/dL — ABNORMAL HIGH (ref 70–99)
Potassium: 3.2 mEq/L — ABNORMAL LOW (ref 3.7–5.3)
SODIUM: 140 meq/L (ref 137–147)

## 2013-07-09 MED ORDER — METOLAZONE 2.5 MG PO TABS: 2.5000 mg | ORAL_TABLET | ORAL | Status: DC

## 2013-07-09 NOTE — Patient Instructions (Signed)
Follow up 1 month  Take Metolazone 2.5 mg every Monday Wednesday and Friday   Hold Metolazone if weight is < 153 pounds or he is dizzy or if his BP is < 90   Do the following things EVERYDAY: 1) Weigh yourself in the morning before breakfast. Write it down and keep it in a log. 2) Take your medicines as prescribed 3) Eat low salt foods-Limit salt (sodium) to 2000 mg per day.  4) Stay as active as you can everyday 5) Limit all fluids for the day to less than 2 liters

## 2013-07-09 NOTE — Progress Notes (Signed)
Patient ID: Mario Cline, male   DOB: February 23, 1929, 80 y.o.   MRN: [id number]  PCP: [facility] Cardiologist: Dr Bronson Ing  ADVANCED HF CLINIC NOTE  SUBJECTIVE: [name] is an 80 y/o male has a h/o HTN, CKD (Cr 2.1-2.5), chronic AF on coumadin, OSA on CPAP and CHF due to NICM EF 15% s/p SJ ICD (cath 1997 with minimal non-obstructive CAD).   He was hospitalized in September 2014 with ADHF. Diuresed ~ 25 pounds. D/C weight ~ 168 pounds (163 at home).   He returns for follow up. Last visit he was instructed hold torsemide if his weight is < 156 pounds. Weight a thome at home 156- 159 pounds.  Denies SOB/PND. Sleeps on 2 pillows + Orthopnea. + Bendopnea.  Lower extremity edema improved. Appetite good. Using CPAP nightly. Compliant with medications.   Labs 06/04/13 K 3.2 Creatinine 2.09 Labs 2/0/15 K 3.3 Creatinine 1.79   ECHO 09/20/2011 - Left ventricle: The cavity size was mildly to moderately dilated. Wall thickness was normal. Systolic function was severely reduced. The estimated ejection fraction was 15%. Severe diffuse hypokinesis. - Aortic valve: Moderately calcified annulus. Trileaflet. Trivial regurgitation. - Aorta: The aorta was moderately calcified. - Left atrium: The atrium was mildly to moderately dilated. - Right atrium: The atrium was mildly dilated.    Past Medical History  Diagnosis Date  . Nonischemic cardiomyopathy     Cath in 1997-nonobstructive disease with normal EF; h/o CHF; EF-45% in '03, 25% in '08, 25-30% in '11;   . Hypertension   . Orthostatic hypotension   . Obstructive sleep apnea 2002    CPAP prescribed  . DJD (degenerative joint disease)   . AICD (automatic cardioverter/defibrillator) present 06/2007    AICD/dual-chamber pacemaker - (St. Jude)- 2/09;   . Atrial fibrillation 04/2009    Rare brief episodes by ICD interrogation until 03/2011->persistent  . Chronic anticoagulation   . Chronic kidney disease, stage III (moderate)     Cardiorenal disease.     No Known Allergies  Current Outpatient Prescriptions  Medication Sig Dispense Refill  . allopurinol (ZYLOPRIM) 100 MG tablet Take 1 tablet (100 mg total) by mouth daily.  30 tablet  3  . carvedilol (COREG) 6.25 MG tablet Take 1 tablet (6.25 mg total) by mouth 2 (two) times daily with a meal.  60 tablet  3  . colchicine 0.6 MG tablet Take 0.6 mg by mouth daily.      . fluticasone (FLONASE) 50 MCG/ACT nasal spray       . fluticasone (VERAMYST) 27.5 MCG/SPRAY nasal spray Place 2 sprays into the nose daily.      . metolazone (ZAROXOLYN) 2.5 MG tablet Take 1 tablet (2.5 mg total) by mouth 2 (two) times a week. EVERY MON AND FRI  10 tablet  3  . potassium chloride SA (K-DUR,KLOR-CON) 20 MEQ tablet Sunday, Tuesday, Wednesday, Thursday, Saturday take 20 meq (1 tab) once a day.Marland KitchenMarland KitchenMonday and Friday take 40 meq (2 tabs) once a day  40 tablet  3  . torsemide (DEMADEX) 20 MG tablet Take 40 mg by mouth 2 (two) times daily.      Marland Kitchen warfarin (COUMADIN) 5 MG tablet Take 2.5-5 mg by mouth See admin instructions. **Take one-half tablet on Mondays and Thursdays. Takes one tablet on all other days.**      . zaleplon (SONATA) 5 MG capsule 1 for sleep as needed, no less than 3 hours before morning  30 capsule  5   No current facility-administered medications for  this encounter.    Past Medical History  Diagnosis Date  . Nonischemic cardiomyopathy     Cath in 1997-nonobstructive disease with normal EF; h/o CHF; EF-45% in '03, 25% in '08, 25-30% in '11;   . Hypertension   . Orthostatic hypotension   . Obstructive sleep apnea 2002    CPAP prescribed  . DJD (degenerative joint disease)   . AICD (automatic cardioverter/defibrillator) present 06/2007    AICD/dual-chamber pacemaker - (St. Jude)- 2/09;   . Atrial fibrillation 04/2009    Rare brief episodes by ICD interrogation until 03/2011->persistent  . Chronic anticoagulation   . Chronic kidney disease, stage III (moderate)     Cardiorenal disease.     Past Surgical History  Procedure Laterality Date  . Tonsillectomy    . Pacemaker insertion    . Cholecystectomy    . Posterior laminectomy / decompression lumbar spine  1985    +fusion; Dr. Pearlie Oyster  . Cardiac defibrillator placement  06/2007    History   Social History  . Marital Status: Married    Spouse Name: N/A    Number of Children: 2  . Years of Education: N/A   Occupational History  . Retired     Insurance account manager   Social History Main Topics  . Smoking status: Never Smoker   . Smokeless tobacco: Never Used  . Alcohol Use: 0.5 oz/week    1 drink(s) per week  . Drug Use: No  . Sexual Activity: No   Other Topics Concern  . Not on file   Social History Narrative  . No narrative on file     Filed Vitals:   07/09/13 1506  BP: 102/58  Pulse: 66  Resp: 18  Weight: 163 lb 8 oz (74.163 kg)  SpO2: 98%    PHYSICAL EXAM General: Elderly frail. NAD Wife and son present.  Neck: JVP ~9 prominent CV waves  no thyromegaly or thyroid nodule.  Lungs: Clear to auscultation bilaterally with normal respiratory effort. CV: Laterally displaced PMI.  Heart irregularly irregular, normal S1/S2, +S3, 3/6 MR    Abdomen: Soft, nontender, no hepatosplenomegaly, nondistended.  Neurologic: Alert and oriented x 3.  Psych: Normal affect. Extremities: No clubbing or cyanosis. R and LLE trace -1 + edema.       ASSESSMENT AND PLAN 1. Chronic systolic heart failure. Has ST Jude ICD.  EF 15% due to NICM. Last echo 09/2011.  NYHA IIIb. Volume status mildly elevated. Continue torsemide 40 mg twice a day and increase Metoalzone three times a week. Will readjust his baseline weight to closer to 153 -156 pounds.  Continue current dose of carvedilol 6.25 mg twice a day.  He is not on ace/dig/spiro due to CKD. BP to soft for hydralazine/imdur.  Lengthy discussion him and his wife regarding limiting fluid intake to < 2 liters per day and low salt food choices.  He is  not candidate for LVAD or heart transplant due to his age but he may require inotropes at some point.  Check ECHO  Check BMET  .  2. Chronic Atrial fibrillation: rate controlled, on warfarin. 3. CKD, stage IV Creatinine baseline 2.0-2.3 4. OSA- continue nightly CPAP.    Follow up in 1 month  CLEGG,AMY NP-C 3:20 PM  Patient seen and examined with Darrick Grinder, NP. We discussed all aspects of the encounter. I agree with the assessment and plan as stated above. He remians fairly tenuous. Volume status mildly elevated. Reinforced need for daily weights and reviewed  use of sliding scale diuretics. Agree with med adjustments as above. Not candidate for VAD or transplant. If has clinical deterioration could consider trial of home inotropes.   Benay Spice 3:53 PM

## 2013-07-10 ENCOUNTER — Telehealth (HOSPITAL_COMMUNITY): Payer: Self-pay

## 2013-07-10 MED ORDER — POTASSIUM CHLORIDE CRYS ER 20 MEQ PO TBCR: 40.0000 meq | EXTENDED_RELEASE_TABLET | Freq: Two times a day (BID) | ORAL | Status: DC

## 2013-07-10 NOTE — Telephone Encounter (Signed)
Patient called to report lab results and med changes, spoke with wife due to patient's hearing impairment on phone, informed of results and instructed to begin taking potassium 40 meq twice daily.  Aware and agreeable. Rx updated and sent to preferred pharmacy. [name]

## 2013-07-11 ENCOUNTER — Ambulatory Visit (INDEPENDENT_AMBULATORY_CARE_PROVIDER_SITE_OTHER): Payer: Medicare Other | Admitting: *Deleted

## 2013-07-11 ENCOUNTER — Encounter: Payer: Self-pay | Admitting: Internal Medicine

## 2013-07-11 DIAGNOSIS — I428 Other cardiomyopathies: Secondary | ICD-10-CM

## 2013-07-11 DIAGNOSIS — Z95810 Presence of automatic (implantable) cardiac defibrillator: Secondary | ICD-10-CM

## 2013-07-11 LAB — MDC_IDC_ENUM_SESS_TYPE_REMOTE
Brady Statistic AP VS Percent: 0 %
Brady Statistic AS VP Percent: 27 %
Brady Statistic RA Percent Paced: 1 %
Date Time Interrogation Session: 20150304080125
HIGH POWER IMPEDANCE MEASURED VALUE: 41 Ohm
Implantable Pulse Generator Serial Number: 499949
Lead Channel Impedance Value: 350 Ohm
MDC IDC MSMT BATTERY REMAINING LONGEVITY: 46 mo
MDC IDC MSMT BATTERY VOLTAGE: 2.57 V
MDC IDC MSMT LEADCHNL RA IMPEDANCE VALUE: 300 Ohm
MDC IDC MSMT LEADCHNL RV SENSING INTR AMPL: 6.9 mV
MDC IDC SET LEADCHNL RA PACING AMPLITUDE: 2 V
MDC IDC SET LEADCHNL RV PACING AMPLITUDE: 2 V
MDC IDC SET LEADCHNL RV PACING PULSEWIDTH: 0.5 ms
MDC IDC SET LEADCHNL RV SENSING SENSITIVITY: 0.3 mV
MDC IDC STAT BRADY AP VP PERCENT: 0 %
MDC IDC STAT BRADY AS VS PERCENT: 73 %
MDC IDC STAT BRADY RV PERCENT PACED: 7.9 %
Zone Setting Detection Interval: 250 ms
Zone Setting Detection Interval: 300 ms
Zone Setting Detection Interval: 330 ms

## 2013-07-13 ENCOUNTER — Encounter: Payer: Self-pay | Admitting: Internal Medicine

## 2013-07-23 ENCOUNTER — Encounter: Payer: Self-pay | Admitting: Cardiovascular Disease

## 2013-07-26 ENCOUNTER — Ambulatory Visit (INDEPENDENT_AMBULATORY_CARE_PROVIDER_SITE_OTHER): Payer: Medicare Other | Admitting: Otolaryngology

## 2013-07-26 DIAGNOSIS — J310 Chronic rhinitis: Secondary | ICD-10-CM

## 2013-07-26 DIAGNOSIS — J342 Deviated nasal septum: Secondary | ICD-10-CM

## 2013-07-26 DIAGNOSIS — J343 Hypertrophy of nasal turbinates: Secondary | ICD-10-CM

## 2013-07-27 ENCOUNTER — Ambulatory Visit (INDEPENDENT_AMBULATORY_CARE_PROVIDER_SITE_OTHER): Payer: Medicare Other | Admitting: Cardiovascular Disease

## 2013-07-27 VITALS — BP 95/54 | HR 90 | Ht 69.0 in | Wt 165.0 lb

## 2013-07-27 DIAGNOSIS — I1310 Hypertensive heart and chronic kidney disease without heart failure, with stage 1 through stage 4 chronic kidney disease, or unspecified chronic kidney disease: Secondary | ICD-10-CM

## 2013-07-27 DIAGNOSIS — I5022 Chronic systolic (congestive) heart failure: Secondary | ICD-10-CM

## 2013-07-27 DIAGNOSIS — Z7901 Long term (current) use of anticoagulants: Secondary | ICD-10-CM

## 2013-07-27 DIAGNOSIS — I10 Essential (primary) hypertension: Secondary | ICD-10-CM

## 2013-07-27 DIAGNOSIS — I428 Other cardiomyopathies: Secondary | ICD-10-CM

## 2013-07-27 DIAGNOSIS — N184 Chronic kidney disease, stage 4 (severe): Secondary | ICD-10-CM

## 2013-07-27 DIAGNOSIS — I4891 Unspecified atrial fibrillation: Secondary | ICD-10-CM

## 2013-07-27 DIAGNOSIS — G4733 Obstructive sleep apnea (adult) (pediatric): Secondary | ICD-10-CM

## 2013-07-27 DIAGNOSIS — E782 Mixed hyperlipidemia: Secondary | ICD-10-CM

## 2013-07-27 DIAGNOSIS — Z95810 Presence of automatic (implantable) cardiac defibrillator: Secondary | ICD-10-CM

## 2013-07-27 LAB — BASIC METABOLIC PANEL
BUN: 75 mg/dL — ABNORMAL HIGH (ref 6–23)
CALCIUM: 9.4 mg/dL (ref 8.4–10.5)
CO2: 29 mEq/L (ref 19–32)
Chloride: 101 mEq/L (ref 96–112)
Creat: 1.67 mg/dL — ABNORMAL HIGH (ref 0.50–1.35)
Glucose, Bld: 137 mg/dL — ABNORMAL HIGH (ref 70–99)
Potassium: 3.4 mEq/L — ABNORMAL LOW (ref 3.5–5.3)
SODIUM: 142 meq/L (ref 135–145)

## 2013-07-27 NOTE — Progress Notes (Signed)
Patient ID: Mario Cline, male   DOB: [birth year], 80 y.o.   MRN: [id number]      SUBJECTIVE: Mr. Eveland is an 80 y/o male has a h/o HTN, CKD (Cr 2.1-2.5), chronic AF on coumadin, OSA on CPAP and CHF due to NICM EF 15% s/p SJ ICD (cath 1997 with minimal non-obstructive CAD).  He was hospitalized in September 2014 with ADHF.  He follows up in the advanced heart failure clinic, and his metolazone was most recently increased to three times a week. BUN 83, creatinine 1.74 on 3/2, K 3.2 and KCl increased to 40 meq bid. Baseline weight reestablished at 153-156 lbs. An echocardiogram is pending.   He is feeling well. His weight is 158.6 lbs this morning at home. He is able to walk more than he was able to before prior to experiencing symptoms of shortness of breath. He said it's about 50 ft to his mailbox and he is able to walk to it and back without difficulty. He has 2 pillow orthopnea, but denies PND. His appetite has increased as well.  He is here with his wife.   No Known Allergies  Current Outpatient Prescriptions  Medication Sig Dispense Refill  . allopurinol (ZYLOPRIM) 100 MG tablet Take 1 tablet (100 mg total) by mouth daily.  30 tablet  3  . carvedilol (COREG) 6.25 MG tablet Take 1 tablet (6.25 mg total) by mouth 2 (two) times daily with a meal.  60 tablet  3  . colchicine 0.6 MG tablet Take 0.6 mg by mouth daily.      . fluticasone (FLONASE) 50 MCG/ACT nasal spray       . fluticasone (VERAMYST) 27.5 MCG/SPRAY nasal spray Place 2 sprays into the nose daily.      . metolazone (ZAROXOLYN) 2.5 MG tablet Take 1 tablet (2.5 mg total) by mouth 2 (two) times a week. EVERY MON  Wed and FRI  12 tablet  3  . potassium chloride SA (K-DUR,KLOR-CON) 20 MEQ tablet Take 2 tablets (40 mEq total) by mouth 2 (two) times daily.  120 tablet  3  . torsemide (DEMADEX) 20 MG tablet Take 40 mg by mouth 2 (two) times daily.      Marland Kitchen warfarin (COUMADIN) 5 MG tablet Take 2.5-5 mg by mouth See admin instructions.  **Take one-half tablet on Mondays and Thursdays. Takes one tablet on all other days.**      . zaleplon (SONATA) 5 MG capsule 1 for sleep as needed, no less than 3 hours before morning  30 capsule  5   No current facility-administered medications for this visit.    Past Medical History  Diagnosis Date  . Nonischemic cardiomyopathy     Cath in 1997-nonobstructive disease with normal EF; h/o CHF; EF-45% in '03, 25% in '08, 25-30% in '11;   . Hypertension   . Orthostatic hypotension   . Obstructive sleep apnea 2002    CPAP prescribed  . DJD (degenerative joint disease)   . AICD (automatic cardioverter/defibrillator) present 06/2007    AICD/dual-chamber pacemaker - (St. Jude)- 2/09;   . Atrial fibrillation 04/2009    Rare brief episodes by ICD interrogation until 03/2011->persistent  . Chronic anticoagulation   . Chronic kidney disease, stage III (moderate)     Cardiorenal disease.    Past Surgical History  Procedure Laterality Date  . Tonsillectomy    . Pacemaker insertion    . Cholecystectomy    . Posterior laminectomy / decompression lumbar spine  1985    +  fusion; Dr. Pearlie Oyster  . Cardiac defibrillator placement  06/2007    History   Social History  . Marital Status: Married    Spouse Name: N/A    Number of Children: 2  . Years of Education: N/A   Occupational History  . Retired     Insurance account manager   Social History Main Topics  . Smoking status: Never Smoker   . Smokeless tobacco: Never Used  . Alcohol Use: 0.5 oz/week    1 drink(s) per week  . Drug Use: No  . Sexual Activity: No   Other Topics Concern  . Not on file   Social History Narrative  . No narrative on file     Filed Vitals:   07/27/13 0822  Pulse: 90  Height: 5\' 9"  (1.753 m)  Weight: 165 lb (74.844 kg)  SpO2: 100%   BP 95/54 Pulse 90   PHYSICAL EXAM General: Elderly, frail. NAD. Neck: JVP ~7 . No thyromegaly or thyroid nodule.  Lungs: Clear to auscultation  bilaterally with normal respiratory effort.  CV: Laterally displaced PMI. Heart irregularly irregular, normal S1/S2, +S3, no murmur appreciated.  Abdomen: Soft, nontender, no hepatosplenomegaly, nondistended.  Neurologic: Alert and oriented x 3.  Psych: Normal affect.  Extremities: No clubbing or cyanosis. Trace edema in b/l LE's.    ECG: reviewed and available in electronic records.      ASSESSMENT AND PLAN: 1. Chronic systolic heart failure. Has ST Jude ICD. EF 15% due to NICM. Last echo 09/2011.  NYHA IIIb. Volume status appears normal. Continue torsemide 40 mg twice a day and metoalzone three times a week.  Metolazone to be held if SBP < 90 mmHg or if he is feeling dizzy. Continue current dose of carvedilol 6.25 mg twice a day.  He is not on ace/dig/spiro due to CKD. BP too low for hydralazine/imdur.  He and his wife have been limiting fluid intake to < 2 liters per day and consuming low sodium foods.  He is not candidate for LVAD or heart transplant due to his age but he may require inotropes at some point.  Awaiting echocardiogram. Will repeat BMET. 2. Chronic Atrial fibrillation: rate controlled, on warfarin.  3. CKD, stage IV Creatinine baseline 2.0-2.3, most recently 1.74 on 3/2. Repeat BMET. 4. OSA- continue nightly CPAP.    Dispo: f/u 3 months with me, sooner in HF clinic.   Kate Sable, M.D., F.A.C.C.

## 2013-07-27 NOTE — Patient Instructions (Addendum)
Your physician recommends that you schedule a follow-up appointment in: 3 months   Your physician recommends that you continue on your current medications as directed. Please refer to the Current Medication list given to you today.     Please get blood work today (BMET)     Thank you for choosing [facility] !   

## 2013-07-27 NOTE — Addendum Note (Signed)
Addended by: Barbarann Ehlers A on: 07/27/2013 09:37 AM   Modules accepted: Orders

## 2013-07-30 ENCOUNTER — Telehealth: Payer: Self-pay | Admitting: *Deleted

## 2013-07-30 ENCOUNTER — Telehealth: Payer: Self-pay

## 2013-07-30 ENCOUNTER — Telehealth: Payer: Self-pay | Admitting: Cardiovascular Disease

## 2013-07-30 NOTE — Telephone Encounter (Signed)
Pt was told they need to go get a 2-d echo and one is in system but not by our doctors. Was it the plan for [place] to order echo on this patient and if so when should they go to get it done?

## 2013-07-30 NOTE — Telephone Encounter (Signed)
Unable to reach by phone,NA,NM  Per Dr.Koneswaran after review of blood work done on Friday 07/27/13,patient should change lasix dose to the following :  KCL 80 meq q am and 40 meq q pm  Repeat BMET four days afterwords

## 2013-07-30 NOTE — Telephone Encounter (Signed)
Pt 's echo was put in by Dr Sung Amabile on March 2,2015. Pt wanted an appt made for [facility]. TS at front desk made appt for pt. Made pt aware that the results will come from Dr Valarie Cones office.

## 2013-07-30 NOTE — Telephone Encounter (Signed)
K slightly low at 3.4. I attempted calling this patient this morning, but there was no answer and voice mail to leave a message. Please contact this patient later today to confirm how much potassium he is taking, so that appropriate instructions can be provided. Thank you.

## 2013-07-31 ENCOUNTER — Other Ambulatory Visit: Payer: Self-pay

## 2013-07-31 ENCOUNTER — Telehealth: Payer: Self-pay

## 2013-07-31 DIAGNOSIS — E876 Hypokalemia: Secondary | ICD-10-CM

## 2013-07-31 MED ORDER — FUROSEMIDE 20 MG PO TABS: ORAL_TABLET | ORAL | Status: DC

## 2013-07-31 MED ORDER — POTASSIUM CHLORIDE CRYS ER 20 MEQ PO TBCR: EXTENDED_RELEASE_TABLET | ORAL | Status: DC

## 2013-07-31 NOTE — Telephone Encounter (Signed)
Will attempt to contact again later today.

## 2013-07-31 NOTE — Telephone Encounter (Signed)
I spoke with wife Darryll Capers just now.I instructed her that Dr.koneswaran wants her husband to increase KCL to 80 meq q am and 40 meq q pm. I had to repeat this information several times to her.I also expressed he needs repeat BMET on Saturday.She wanted to know if we could change his dose then.I responded that we need to know his lab values first.  She stated she is very angry as "Jimmie doesn't like those pills"  I Called Tripp, the pharmacist at [facility] and he indicated KCL only comes in 20 meq tablest.Powder formula is not covered by patients insurance plan.   I immediately called wife back and phone just ran,no answering machine pick up.  She had previously stated this am they have to see Dr.Fanta,run to their church,see an insurance person,and see a Mr.Whitt.She said she has a cell phone but wouldn't be able to answer.This nurse has done all I can do at this point.  She still requests a call back from Dr.Koneswaran-he called her yesterday and got no anser either.

## 2013-08-02 ENCOUNTER — Ambulatory Visit (HOSPITAL_COMMUNITY)
Admission: RE | Admit: 2013-08-02 | Discharge: 2013-08-02 | Disposition: A | Discharge status: Home / Self Care | Payer: Medicare Other | Source: Ambulatory Visit | Attending: Adult Health | Admitting: Adult Health

## 2013-08-02 DIAGNOSIS — I129 Hypertensive chronic kidney disease with stage 1 through stage 4 chronic kidney disease, or unspecified chronic kidney disease: Secondary | ICD-10-CM | POA: Insufficient documentation

## 2013-08-02 DIAGNOSIS — N184 Chronic kidney disease, stage 4 (severe): Secondary | ICD-10-CM | POA: Insufficient documentation

## 2013-08-02 DIAGNOSIS — I5022 Chronic systolic (congestive) heart failure: Secondary | ICD-10-CM | POA: Insufficient documentation

## 2013-08-02 DIAGNOSIS — I4891 Unspecified atrial fibrillation: Secondary | ICD-10-CM | POA: Insufficient documentation

## 2013-08-02 DIAGNOSIS — E785 Hyperlipidemia, unspecified: Secondary | ICD-10-CM | POA: Insufficient documentation

## 2013-08-02 DIAGNOSIS — I700 Atherosclerosis of aorta: Secondary | ICD-10-CM | POA: Insufficient documentation

## 2013-08-02 DIAGNOSIS — I059 Rheumatic mitral valve disease, unspecified: Secondary | ICD-10-CM

## 2013-08-02 DIAGNOSIS — Z95810 Presence of automatic (implantable) cardiac defibrillator: Secondary | ICD-10-CM | POA: Insufficient documentation

## 2013-08-02 NOTE — Progress Notes (Signed)
*  PRELIMINARY RESULTS* Echocardiogram 2D Echocardiogram has been performed.  Camp Swift, [place] 08/02/2013, 10:42 AM

## 2013-08-04 LAB — BASIC METABOLIC PANEL
BUN: 85 mg/dL — ABNORMAL HIGH (ref 6–23)
CHLORIDE: 104 meq/L (ref 96–112)
CO2: 27 meq/L (ref 19–32)
Calcium: 9.1 mg/dL (ref 8.4–10.5)
Creat: 1.9 mg/dL — ABNORMAL HIGH (ref 0.50–1.35)
GLUCOSE: 88 mg/dL (ref 70–99)
POTASSIUM: 4 meq/L (ref 3.5–5.3)
SODIUM: 140 meq/L (ref 135–145)

## 2013-08-06 ENCOUNTER — Telehealth: Payer: Self-pay | Admitting: Cardiovascular Disease

## 2013-08-06 DIAGNOSIS — E876 Hypokalemia: Secondary | ICD-10-CM

## 2013-08-06 NOTE — Telephone Encounter (Signed)
Pt is taking 80 meq am, 40 meq pm

## 2013-08-06 NOTE — Telephone Encounter (Signed)
I spoke with Mr.Huether and relayed Dr.Koneswaran's comment that renal function is stable, and his potassium is normal.Patient asked what should he do regarding current potassium dose,I told him there are no changes in dosing.    Wife hopes she could reduce dose dose of potassium,please advise current dose

## 2013-08-06 NOTE — Telephone Encounter (Signed)
I would have him continue present dose and have BMET repeated in 2 weeks. I would also inform them about the potential for muscle cramps and arrhythmias with low potassium levels.

## 2013-08-06 NOTE — Telephone Encounter (Signed)
I relayed Dr.Koneswaran's message to wife Darryll Capers and she understood reasons for continuation of potassium

## 2013-08-06 NOTE — Telephone Encounter (Signed)
How much is he actually taking at present?

## 2013-08-06 NOTE — Telephone Encounter (Signed)
Please call patient back regarding lab work from Saturday  / tgs

## 2013-08-06 NOTE — Addendum Note (Signed)
Encounter addended by: Jolaine Artist, MD on: 08/06/2013  3:53 PM<BR>     Documentation filed: Follow-up Section, LOS Section, Notes Section

## 2013-08-09 ENCOUNTER — Ambulatory Visit (HOSPITAL_COMMUNITY)
Admission: RE | Admit: 2013-08-09 | Discharge: 2013-08-09 | Disposition: A | Discharge status: Home / Self Care | Payer: Medicare Other | Source: Ambulatory Visit | Attending: Internal Medicine | Admitting: Internal Medicine

## 2013-08-09 ENCOUNTER — Encounter (HOSPITAL_COMMUNITY): Payer: Self-pay

## 2013-08-09 VITALS — BP 70/54 | HR 73 | Wt 163.8 lb

## 2013-08-09 DIAGNOSIS — I959 Hypotension, unspecified: Secondary | ICD-10-CM | POA: Insufficient documentation

## 2013-08-09 DIAGNOSIS — I509 Heart failure, unspecified: Secondary | ICD-10-CM

## 2013-08-09 DIAGNOSIS — I5022 Chronic systolic (congestive) heart failure: Secondary | ICD-10-CM

## 2013-08-09 DIAGNOSIS — I5023 Acute on chronic systolic (congestive) heart failure: Secondary | ICD-10-CM | POA: Insufficient documentation

## 2013-08-09 DIAGNOSIS — N189 Chronic kidney disease, unspecified: Secondary | ICD-10-CM | POA: Insufficient documentation

## 2013-08-09 DIAGNOSIS — I130 Hypertensive heart and chronic kidney disease with heart failure and stage 1 through stage 4 chronic kidney disease, or unspecified chronic kidney disease: Secondary | ICD-10-CM | POA: Insufficient documentation

## 2013-08-09 DIAGNOSIS — I1310 Hypertensive heart and chronic kidney disease without heart failure, with stage 1 through stage 4 chronic kidney disease, or unspecified chronic kidney disease: Secondary | ICD-10-CM

## 2013-08-09 NOTE — Progress Notes (Signed)
Patient ID: Mario Cline, male   DOB: [birth year], 79 y.o.   MRN: [id number]  PCP: [facility]enc[facility] Noble Cardiologist: Dr Bronson Ing  ADVANCED HF CLINIC NOTE  SUBJECTIVE: Mario Cline is an 80 y/o male has a h/o HTN, CKD (Cr 2.1-2.5), chronic AF on coumadin, OSA on CPAP and CHF due to NICM EF 15% s/p SJ ICD (cath 1997 with minimal non-obstructive CAD).   He was hospitalized in September 2014 with ADHF. Diuresed ~ 25 pounds. D/C weight ~ 168 pounds (163 at home).   He returns for follow up. He has been instructed to hold torsemide and metolazone if his weight is less than 153 pounds.  Weight at home 154-158 pounds.  Weight is stable here today. He is now taking metolazone 1-2 times a week.  SBP at home 70-104.  He is not lightheaded today.  Occasional mild lightheadedness when he first stands.  No falls.  No chest pain.  He can walk 300 feet now without dyspnea, which is improved.  He walks in the yard. Sleeps on 2 pillows. + Orthopnea. + Bendopnea.  Lower extremity edema improved. Appetite good. Using CPAP nightly. Compliant with medications.   Labs 06/04/13 K 3.2 Creatinine 2.09 Labs 2/0/15 K 3.3 Creatinine 1.79  Labs 07/27/13 K 3.4 Creatinine 1.6 potassium increased to 120 meq daily  Labs 08/04/13 K 4.0 Creatinine 1.9  ECHO 09/20/2011 - Left ventricle: The cavity size was mildly to moderately dilated. Wall thickness was normal. Systolic function was severely reduced. The estimated ejection fraction was 15%. Severe diffuse hypokinesis. - Aortic valve: Moderately calcified annulus. Trileaflet. Trivial regurgitation. - Aorta: The aorta was moderately calcified. - Left atrium: The atrium was mildly to moderately dilated. - Right atrium: The atrium was mildly dilated.  ECHO 07/27/13  Left ventricle: The cavity size was moderately dilated. Wall thickness was increased in a pattern of mild LVH. Systolic function is severely reduced, estimated EF 10-15%. Severe diffuse hypokinesis is seen LA - mod-severely  dilated RV moderately dilated  RA- moderately dilated PA peak PA pressure 46 mm gh   Past Medical History  Diagnosis Date  . Nonischemic cardiomyopathy     Cath in 1997-nonobstructive disease with normal EF; h/o CHF; EF-45% in '03, 25% in '08, 25-30% in '11;   . Hypertension   . Orthostatic hypotension   . Obstructive sleep apnea 2002    CPAP prescribed  . DJD (degenerative joint disease)   . AICD (automatic cardioverter/defibrillator) present 06/2007    AICD/dual-chamber pacemaker - (St. Jude)- 2/09;   . Atrial fibrillation 04/2009    Rare brief episodes by ICD interrogation until 03/2011->persistent  . Chronic anticoagulation   . Chronic kidney disease, stage III (moderate)     Cardiorenal disease.    No Known Allergies  Current Outpatient Prescriptions  Medication Sig Dispense Refill  . allopurinol (ZYLOPRIM) 100 MG tablet Take 100 mg by mouth 2 (two) times daily.      . carvedilol (COREG) 6.25 MG tablet Take 1 tablet (6.25 mg total) by mouth 2 (two) times daily with a meal.  60 tablet  3  . fluticasone (FLONASE) 50 MCG/ACT nasal spray       . fluticasone (VERAMYST) 27.5 MCG/SPRAY nasal spray Place 2 sprays into the nose daily.      . metolazone (ZAROXOLYN) 2.5 MG tablet Take 2.5 mg by mouth 3 (three) times a week. EVERY MON  Wed and FRI      . potassium chloride SA (K-DUR,KLOR-CON) 20 MEQ tablet Take 80 mg  am ,and 40 mg pm  180 tablet  3  . torsemide (DEMADEX) 20 MG tablet Take 40 mg by mouth 2 (two) times daily.      Marland Kitchen warfarin (COUMADIN) 5 MG tablet Take 2.5-5 mg by mouth See admin instructions. **Take one-half tablet on Mondays and Thursdays. Takes one tablet on all other days.**      . zaleplon (SONATA) 5 MG capsule 1 for sleep as needed, no less than 3 hours before morning  30 capsule  5   No current facility-administered medications for this encounter.    Past Medical History  Diagnosis Date  . Nonischemic cardiomyopathy     Cath in 1997-nonobstructive disease with  normal EF; h/o CHF; EF-45% in '03, 25% in '08, 25-30% in '11;   . Hypertension   . Orthostatic hypotension   . Obstructive sleep apnea 2002    CPAP prescribed  . DJD (degenerative joint disease)   . AICD (automatic cardioverter/defibrillator) present 06/2007    AICD/dual-chamber pacemaker - (St. Jude)- 2/09;   . Atrial fibrillation 04/2009    Rare brief episodes by ICD interrogation until 03/2011->persistent  . Chronic anticoagulation   . Chronic kidney disease, stage III (moderate)     Cardiorenal disease.    Past Surgical History  Procedure Laterality Date  . Tonsillectomy    . Pacemaker insertion    . Cholecystectomy    . Posterior laminectomy / decompression lumbar spine  1985    +fusion; Dr. Pearlie Oyster  . Cardiac defibrillator placement  06/2007    History   Social History  . Marital Status: Married    Spouse Name: N/A    Number of Children: 2  . Years of Education: N/A   Occupational History  . Retired     Insurance account manager   Social History Main Topics  . Smoking status: Never Smoker   . Smokeless tobacco: Never Used  . Alcohol Use: 0.5 oz/week    1 drink(s) per week  . Drug Use: No  . Sexual Activity: No   Other Topics Concern  . Not on file   Social History Narrative  . No narrative on file     Filed Vitals:   08/09/13 1006  BP: 70/54  Pulse: 73  Weight: 163 lb 12.8 oz (74.299 kg)  SpO2: 94%    PHYSICAL EXAM General: Elderly, frail. NAD, Wife and son present.  Neck: JVP 8, no thyromegaly or thyroid nodule.  Lungs: Clear to auscultation bilaterally with normal respiratory effort. CV: Laterally displaced PMI.  Heart irregularly irregular, normal S1/S2, no S3, 2/6 MR    Abdomen: Soft, nontender, no hepatosplenomegaly, nondistended.  Neurologic: Alert and oriented x 3.  Psych: Normal affect. Extremities: No clubbing or cyanosis. 1+ edema 3/4 to knees bilaterally.     ASSESSMENT AND PLAN 1. Chronic systolic heart failure:  Nonischemic cardiomyopathy.  Has ST Jude ICD.  ECHO 07/27/13 EF 15%.  Stable NYHA class III symptoms.  Stable volume status, weight has not changed since last appointment.   BP runs low but he is fairly asymptomatic from this and actually seems to be doing better overall.  - Continue torsemide 40 mg twice a day and will have him use metolazone as he has been doing (take dose if weight is not < 153 on home scale, no more than 3 times a week).  - Continue current dose of carvedilol 6.25 mg twice a day.  - He is not on ACEI, spironolactone, or digoxin at this time  due to CKD. BP too soft at this time for hydralazine/imdur.  - He is not candidate for LVAD or heart transplant due to his age but he may require inotropes at some point (not yet).  2. Chronic Atrial fibrillation: rate controlled, on warfarin. 3. CKD: Stage IV. Creatinine was at baseline when checked last week (1.9).  This will need to be followed closely.  4. OSA: continue nightly CPAP.   Follow up in [place] office for BMET in 2 wks, see Dr. Jacinta Shoe in 1 month.  He can be seen back here as needed, especially if he is thought to be nearing need for home inotropes.  The trip to [place] is difficult for him (he and wife do not drive to [place] and have to get son to bring them).    CLEGG,AMY NP-C 10:54 AM  Patient seen with NP, agree with the above note. Currently stable.  BP is low but has been persistently low for months now.  He has minimal lightheadedness. Would manage diuretics as outlined above, no new medications.  Will need to follow clinical status and renal function closely.   Loralie Champagne 08/10/2013

## 2013-08-09 NOTE — Patient Instructions (Signed)
Your physician recommends that you schedule a follow-up appointment in: 1 month with [place] in 2 weeks with Dr. Bronson Ing  Your physician recommends that you schedule a follow-up appointment in: CHF clinic as needed  Do the following things EVERYDAY: 1) Weigh yourself in the morning before breakfast. Write it down and keep it in a log. 2) Take your medicines as prescribed 3) Eat low salt foods-Limit salt (sodium) to 2000 mg per day.  4) Stay as active as you can everyday 5) Limit all fluids for the day to less than 2 liters 6)

## 2013-08-23 ENCOUNTER — Encounter: Payer: Self-pay | Admitting: *Deleted

## 2013-08-23 LAB — BASIC METABOLIC PANEL
BUN: 87 mg/dL — AB (ref 6–23)
CALCIUM: 9.7 mg/dL (ref 8.4–10.5)
CHLORIDE: 101 meq/L (ref 96–112)
CO2: 27 meq/L (ref 19–32)
CREATININE: 1.93 mg/dL — AB (ref 0.50–1.35)
GLUCOSE: 116 mg/dL — AB (ref 70–99)
Potassium: 4 mEq/L (ref 3.5–5.3)
Sodium: 140 mEq/L (ref 135–145)

## 2013-08-30 ENCOUNTER — Telehealth: Payer: Self-pay | Admitting: Internal Medicine

## 2013-08-30 DIAGNOSIS — G4733 Obstructive sleep apnea (adult) (pediatric): Secondary | ICD-10-CM

## 2013-08-30 NOTE — Telephone Encounter (Signed)
Order- DME [facility]    CPAP complicating need to get up for bathroom. Please make sure he knows how to quick release mask from hose, so he can get up with mask on, for less sleep disruption.

## 2013-08-30 NOTE — Telephone Encounter (Signed)
Spoke with the pt's spouse  She reports that the pt is unable to use CPAP like he should  He is on dieretics am and pm and gets up multiple times at night to urinate He finds it hard to go back to sleep  Was taking Sonata to help with sleep per Dr Annamaria Boots, but this was making him feel confused and so he stopped med  I asked how his breathing was, since initial msg stated that he was having SOB, but she states his breathing is fine  CDY, please advise thanks!

## 2013-08-30 NOTE — Telephone Encounter (Signed)
LMTCB

## 2013-08-31 NOTE — Telephone Encounter (Signed)
Pt spouse is aware and order placed.Ochiltree Bing, CMA

## 2013-09-10 ENCOUNTER — Other Ambulatory Visit (HOSPITAL_COMMUNITY): Payer: Self-pay | Admitting: Internal Medicine

## 2013-09-10 ENCOUNTER — Ambulatory Visit (HOSPITAL_COMMUNITY)
Admission: RE | Admit: 2013-09-10 | Discharge: 2013-09-10 | Disposition: A | Discharge status: Home / Self Care | Payer: Medicare Other | Source: Ambulatory Visit | Attending: Internal Medicine | Admitting: Internal Medicine

## 2013-09-10 DIAGNOSIS — M47812 Spondylosis without myelopathy or radiculopathy, cervical region: Secondary | ICD-10-CM | POA: Insufficient documentation

## 2013-09-10 DIAGNOSIS — Z950 Presence of cardiac pacemaker: Secondary | ICD-10-CM | POA: Insufficient documentation

## 2013-09-10 DIAGNOSIS — W19XXXA Unspecified fall, initial encounter: Secondary | ICD-10-CM

## 2013-09-10 DIAGNOSIS — M542 Cervicalgia: Secondary | ICD-10-CM | POA: Insufficient documentation

## 2013-09-11 ENCOUNTER — Ambulatory Visit (INDEPENDENT_AMBULATORY_CARE_PROVIDER_SITE_OTHER): Payer: Medicare Other | Admitting: Cardiovascular Disease

## 2013-09-11 ENCOUNTER — Encounter: Payer: Self-pay | Admitting: Cardiovascular Disease

## 2013-09-11 VITALS — BP 88/54 | HR 55 | Ht 69.0 in | Wt 164.0 lb

## 2013-09-11 DIAGNOSIS — I1310 Hypertensive heart and chronic kidney disease without heart failure, with stage 1 through stage 4 chronic kidney disease, or unspecified chronic kidney disease: Secondary | ICD-10-CM

## 2013-09-11 DIAGNOSIS — E876 Hypokalemia: Secondary | ICD-10-CM

## 2013-09-11 DIAGNOSIS — I4891 Unspecified atrial fibrillation: Secondary | ICD-10-CM

## 2013-09-11 DIAGNOSIS — I5022 Chronic systolic (congestive) heart failure: Secondary | ICD-10-CM

## 2013-09-11 DIAGNOSIS — I428 Other cardiomyopathies: Secondary | ICD-10-CM

## 2013-09-11 DIAGNOSIS — Z7901 Long term (current) use of anticoagulants: Secondary | ICD-10-CM

## 2013-09-11 DIAGNOSIS — E782 Mixed hyperlipidemia: Secondary | ICD-10-CM

## 2013-09-11 DIAGNOSIS — I959 Hypotension, unspecified: Secondary | ICD-10-CM

## 2013-09-11 DIAGNOSIS — G4733 Obstructive sleep apnea (adult) (pediatric): Secondary | ICD-10-CM

## 2013-09-11 DIAGNOSIS — N183 Chronic kidney disease, stage 3 unspecified: Secondary | ICD-10-CM

## 2013-09-11 DIAGNOSIS — Z95810 Presence of automatic (implantable) cardiac defibrillator: Secondary | ICD-10-CM

## 2013-09-11 MED ORDER — RIVAROXABAN 10 MG PO TABS: 10.0000 mg | ORAL_TABLET | Freq: Every day | ORAL | Status: DC

## 2013-09-11 NOTE — Progress Notes (Signed)
Patient ID: Mario Cline, male   DOB: Jul 12, 1928, 80 y.o.   MRN: [id number]      SUBJECTIVE: Mario Cline is an 80 y/o male has a h/o HTN, CKD (Cr 2.1-2.5), chronic AF on coumadin, OSA on CPAP and CHF due to NICM EF 15% s/p SJ ICD (cath 1997 with minimal non-obstructive CAD).  He was hospitalized in September 2014 with ADHF.  He follows up in the advanced heart failure clinic. He takes torsemide 40 mg bid and metolazone three times a week.  He has been having issues with hypokalemia and takes supplementation for this. 08/23/13: K 4, BUN 87, creatinine 1.93. Baseline weight reestablished at 153-156 lbs. Weight is 164 pounds in the office today. Echocardiogram in 08/02/2013 showed severely reduced left ventricular systolic function, EF 17-51%, moderately dilated left ventricle with severe diffuse hypokinesis, moderate mitral regurgitation, mild aortic regurgitation, mildly dilated ascending aortic root, right-sided chamber dilatation, mild tricuspid regurgitation, and moderately elevated pulmonary pressures.   His wife said that he has had a host of complaints and symptoms over the past one month. He has had difficulty sleeping and Dr. Willey Blade has prescribed trazodone. His legs become restless if he sits too long. He has felt more weak. He denies paroxysmal nocturnal dyspnea. His weight at home this morning was 161.4 pounds. He is also had a cough and was placed on Ceftin by Dr. Willey Blade.    No Known Allergies  Current Outpatient Prescriptions  Medication Sig Dispense Refill  . allopurinol (ZYLOPRIM) 100 MG tablet Take 100 mg by mouth 2 (two) times daily.      . carvedilol (COREG) 6.25 MG tablet Take 1 tablet (6.25 mg total) by mouth 2 (two) times daily with a meal.  60 tablet  3  . cefUROXime (CEFTIN) 250 MG tablet       . fluticasone (FLONASE) 50 MCG/ACT nasal spray       . fluticasone (VERAMYST) 27.5 MCG/SPRAY nasal spray Place 2 sprays into the nose daily.      . metolazone (ZAROXOLYN) 2.5 MG  tablet Take 2.5 mg by mouth 3 (three) times a week. EVERY MON  Wed and FRI      . potassium chloride SA (K-DUR,KLOR-CON) 20 MEQ tablet Take 80 mg am ,and 40 mg pm  180 tablet  3  . torsemide (DEMADEX) 20 MG tablet Take 40 mg by mouth 2 (two) times daily.      . traZODone (DESYREL) 50 MG tablet       . warfarin (COUMADIN) 5 MG tablet Take 2.5-5 mg by mouth See admin instructions. **Take one-half tablet on Mondays and Thursdays. Takes one tablet on all other days.**       No current facility-administered medications for this visit.    Past Medical History  Diagnosis Date  . Nonischemic cardiomyopathy     Cath in 1997-nonobstructive disease with normal EF; h/o CHF; EF-45% in '03, 25% in '08, 25-30% in '11;   . Hypertension   . Orthostatic hypotension   . Obstructive sleep apnea 2002    CPAP prescribed  . DJD (degenerative joint disease)   . AICD (automatic cardioverter/defibrillator) present 06/2007    AICD/dual-chamber pacemaker - (St. Jude)- 2/09;   . Atrial fibrillation 04/2009    Rare brief episodes by ICD interrogation until 03/2011->persistent  . Chronic anticoagulation   . Chronic kidney disease, stage III (moderate)     Cardiorenal disease.    Past Surgical History  Procedure Laterality Date  . Tonsillectomy    .  Pacemaker insertion    . Cholecystectomy    . Posterior laminectomy / decompression lumbar spine  1985    +fusion; Dr. Pearlie Oyster  . Cardiac defibrillator placement  06/2007    History   Social History  . Marital Status: Married    Spouse Name: N/A    Number of Children: 2  . Years of Education: N/A   Occupational History  . Retired     Insurance account manager   Social History Main Topics  . Smoking status: Never Smoker   . Smokeless tobacco: Never Used  . Alcohol Use: 0.5 oz/week    1 drink(s) per week  . Drug Use: No  . Sexual Activity: No   Other Topics Concern  . Not on file   Social History Narrative  . No narrative on file      Filed Vitals:   09/11/13 1057  BP: 88/54  Pulse: 55  Height: 5\' 9"  (1.753 m)  Weight: 164 lb (74.39 kg)  SpO2: 100%    PHYSICAL EXAM General: Elderly, frail. NAD.  Neck: JVP ~8 . No thyromegaly or thyroid nodule.  Lungs: Clear to auscultation bilaterally with normal respiratory effort.  CV: Laterally displaced PMI. Heart irregularly irregular, normal S1/S2, +S3, no murmur appreciated.  Abdomen: Soft, nontender, no hepatosplenomegaly, nondistended.  Neurologic: Alert and oriented x 3.  Psych: Normal affect.  Extremities: No clubbing or cyanosis. Trace edema in b/l LE's.    ECG: reviewed and available in electronic records.   ECHO 08/02/13): - Left ventricle: The cavity size was moderately dilated. Wall thickness was increased in a pattern of mild LVH. Systolic function is severely reduced, estimated EF 10-15%. Severe diffuse hypokinesis is seen. The study was not technically sufficient to allow evaluation of LV diastolic dysfunction due to atrial fibrillation. Doppler parameters are consistent with both elevated ventricular end-diastolic filling pressure and elevated left atrial filling pressure. Medial E/e' is 23.1. - Aortic valve: Mildly calcified annulus. Moderately thickened leaflets. There was no stenosis. Mild regurgitation. - Aorta: The aorta was mildly calcified. Aortic root dimension: 75mm (ED). - Ascending aorta: The ascending aorta was mildly dilated. 4.09 cm in maximal diameter. - Mitral valve: Mildly dilated annulus. Moderately thickened leaflets . Tethering of leaflet motion due to severe left ventricular dysfunction. Moderate regurgitation. Mild mitral annular calcification. - Left atrium: The atrium was moderately to severely dilated. - Right ventricle: The cavity size was moderately dilated. Pacer wire or catheter noted in right ventricle. Systolic function was moderately to severely reduced. - Right atrium: The atrium was moderately dilated.  Pacer wire or catheter noted in right atrium. - Tricuspid valve: Mild regurgitation. - Pulmonary arteries: PA peak pressure: 47mm Hg (S). Moderately elevated pulmonary pressures. RAP 15 mmHg. - Inferior vena cava: The vessel was dilated; the respirophasic diameter changes were blunted (< 50%); findings are consistent with elevated central venous pressure.     ASSESSMENT AND PLAN: 1. Chronic systolic heart failure. Has ST Jude ICD. EF 15% due to NICM. Last echo 07/2013.  NYHA IIIb. Volume status appears slightly elevated. Continue torsemide 40 mg twice a day and I've asked him to take metoalzone three times a week for the next 2 weeks. I will have a nurse call him in 2 weeks to see how he is doing.  Metolazone to be held if SBP < 90 mmHg or if he is feeling dizzy.  Continue current dose of carvedilol 6.25 mg twice a day.  He is not on ace/dig/spiro due to CKD.  BP too low for hydralazine/imdur.  He and his wife have been limiting fluid intake to < 2 liters per day and consuming low sodium foods.  He is not candidate for LVAD or heart transplant due to his age but he may require inotropes at some point.  2. Chronic Atrial fibrillation: rate controlled, on warfarin. He would like to stop taking warfarin. I will check to see if Xarelto is a viable option with regards to cost. If so, I would treat him with 15 mg daily. 3. CKD, stage IV Creatinine baseline 2.0-2.3, most recently 1.87 on 4/16. GFR 33 ml/min. 4. OSA- continue nightly CPAP.    Dispo: f/u 1 month.   Kate Sable, M.D., F.A.C.C.

## 2013-09-11 NOTE — Patient Instructions (Signed)
Your physician recommends that you schedule a follow-up appointment in: 6 weeks    Your physician has recommended you make the following change in your medication:     Take Metolazone Monday,Wednesday, and Friday this week and next week. I will call you in 2 weeks to see how Mario Cline is doing  I will see how much Xarelto will cost you and you can decide if you want to switch from warfarin (coumadin). I will speak with Lattie Haw your coumadin nurse   Thank you for choosing [facility] !

## 2013-09-17 ENCOUNTER — Other Ambulatory Visit (HOSPITAL_COMMUNITY): Payer: Self-pay | Admitting: Pulmonary Disease

## 2013-09-17 ENCOUNTER — Ambulatory Visit (HOSPITAL_COMMUNITY)
Admission: RE | Admit: 2013-09-17 | Discharge: 2013-09-17 | Disposition: A | Discharge status: Home / Self Care | Payer: Medicare Other | Source: Ambulatory Visit | Attending: Pulmonary Disease | Admitting: Pulmonary Disease

## 2013-09-17 DIAGNOSIS — R0989 Other specified symptoms and signs involving the circulatory and respiratory systems: Secondary | ICD-10-CM | POA: Insufficient documentation

## 2013-09-17 DIAGNOSIS — R059 Cough, unspecified: Secondary | ICD-10-CM | POA: Insufficient documentation

## 2013-09-17 DIAGNOSIS — J189 Pneumonia, unspecified organism: Secondary | ICD-10-CM

## 2013-09-17 DIAGNOSIS — R05 Cough: Secondary | ICD-10-CM | POA: Insufficient documentation

## 2013-09-21 ENCOUNTER — Encounter (HOSPITAL_COMMUNITY): Payer: Self-pay | Admitting: Emergency Medicine

## 2013-09-21 ENCOUNTER — Emergency Department (HOSPITAL_COMMUNITY)
Admission: EM | Admit: 2013-09-21 | Discharge: 2013-09-21 | Disposition: A | Discharge status: Home / Self Care | Payer: Medicare Other | Attending: Emergency Medicine | Admitting: Emergency Medicine

## 2013-09-21 ENCOUNTER — Emergency Department (HOSPITAL_COMMUNITY): Payer: Medicare Other

## 2013-09-21 DIAGNOSIS — R05 Cough: Secondary | ICD-10-CM | POA: Insufficient documentation

## 2013-09-21 DIAGNOSIS — G4733 Obstructive sleep apnea (adult) (pediatric): Secondary | ICD-10-CM | POA: Insufficient documentation

## 2013-09-21 DIAGNOSIS — N183 Chronic kidney disease, stage 3 unspecified: Secondary | ICD-10-CM | POA: Insufficient documentation

## 2013-09-21 DIAGNOSIS — I4891 Unspecified atrial fibrillation: Secondary | ICD-10-CM | POA: Insufficient documentation

## 2013-09-21 DIAGNOSIS — IMO0002 Reserved for concepts with insufficient information to code with codable children: Secondary | ICD-10-CM | POA: Insufficient documentation

## 2013-09-21 DIAGNOSIS — Z95810 Presence of automatic (implantable) cardiac defibrillator: Secondary | ICD-10-CM | POA: Insufficient documentation

## 2013-09-21 DIAGNOSIS — Z79899 Other long term (current) drug therapy: Secondary | ICD-10-CM | POA: Insufficient documentation

## 2013-09-21 DIAGNOSIS — Z792 Long term (current) use of antibiotics: Secondary | ICD-10-CM | POA: Insufficient documentation

## 2013-09-21 DIAGNOSIS — I129 Hypertensive chronic kidney disease with stage 1 through stage 4 chronic kidney disease, or unspecified chronic kidney disease: Secondary | ICD-10-CM | POA: Insufficient documentation

## 2013-09-21 DIAGNOSIS — Z7901 Long term (current) use of anticoagulants: Secondary | ICD-10-CM | POA: Insufficient documentation

## 2013-09-21 DIAGNOSIS — Z8739 Personal history of other diseases of the musculoskeletal system and connective tissue: Secondary | ICD-10-CM | POA: Insufficient documentation

## 2013-09-21 DIAGNOSIS — R059 Cough, unspecified: Secondary | ICD-10-CM | POA: Insufficient documentation

## 2013-09-21 NOTE — ED Notes (Signed)
Pt. Ambulating in room.

## 2013-09-21 NOTE — ED Notes (Signed)
Pt reports having x-ray done on Monday that showed possible pneumonia. His PCP started him on Levaquin. Reports he has been taking Mucinex for cough but tonight the cough wouldn't stop. Reports it being non-productive.

## 2013-09-21 NOTE — ED Provider Notes (Signed)
CSN: [id number]     Arrival date & time 09/21/13  0200 History   First MD Initiated Contact with Patient 09/21/13 0228     Chief Complaint  Patient presents with  . Cough      Patient is a 80 y.o. male presenting with cough. The history is provided by the patient and a relative.  Cough Severity:  Moderate Onset quality:  Gradual Duration:  10 days Timing:  Intermittent Progression:  Worsening Chronicity:  New Smoker: no   Relieved by:  Nothing Worsened by:  Nothing tried Associated symptoms: no chest pain, no fever and no shortness of breath   pt reports cough for over 10 days He has been seen by PCP, had outpatient CXR that revealed pneumonia and he has been on levaquin.  He reports he is not improving No cp/sob No hemoptysis No significant change in LE edema  Past Medical History  Diagnosis Date  . Nonischemic cardiomyopathy     Cath in 1997-nonobstructive disease with normal EF; h/o CHF; EF-45% in '03, 25% in '08, 25-30% in '11;   . Hypertension   . Orthostatic hypotension   . Obstructive sleep apnea 2002    CPAP prescribed  . DJD (degenerative joint disease)   . AICD (automatic cardioverter/defibrillator) present 06/2007    AICD/dual-chamber pacemaker - (St. Jude)- 2/09;   . Atrial fibrillation 04/2009    Rare brief episodes by ICD interrogation until 03/2011->persistent  . Chronic anticoagulation   . Chronic kidney disease, stage III (moderate)     Cardiorenal disease.   Past Surgical History  Procedure Laterality Date  . Tonsillectomy    . Pacemaker insertion    . Cholecystectomy    . Posterior laminectomy / decompression lumbar spine  1985    +fusion; Dr. Pearlie Oyster  . Cardiac defibrillator placement  06/2007   Family History  Problem Relation Age of Onset  . Heart failure Mother   . Heart attack Father   . Cancer Brother     x2   History  Substance Use Topics  . Smoking status: Never Smoker   . Smokeless tobacco: Never Used  . Alcohol Use: 0.5  oz/week    1 drink(s) per week    Review of Systems  Constitutional: Negative for fever.  Respiratory: Positive for cough. Negative for shortness of breath.   Cardiovascular: Negative for chest pain.  All other systems reviewed and are negative.     Allergies  Review of patient's allergies indicates no known allergies.  Home Medications   Prior to Admission medications   Medication Sig Start Date End Date Taking? Authorizing Provider  allopurinol (ZYLOPRIM) 100 MG tablet Take 100 mg by mouth 2 (two) times daily. 06/07/13  Yes Jolaine Artist, MD  carvedilol (COREG) 6.25 MG tablet Take 1 tablet (6.25 mg total) by mouth 2 (two) times daily with a meal. 06/07/13  Yes Jolaine Artist, MD  cefUROXime (CEFTIN) 250 MG tablet  09/10/13  Yes Historical Provider, MD  fluticasone (FLONASE) 50 MCG/ACT nasal spray  05/17/13  Yes Historical Provider, MD  fluticasone (VERAMYST) 27.5 MCG/SPRAY nasal spray Place 2 sprays into the nose daily.   Yes Historical Provider, MD  levofloxacin (LEVAQUIN) 500 MG tablet Take 500 mg by mouth daily.   Yes Historical Provider, MD  metolazone (ZAROXOLYN) 2.5 MG tablet Take 2.5 mg by mouth 3 (three) times a week. EVERY MON  Wed and FRI 07/09/13  Yes Amy D Clegg, NP  potassium chloride SA (K-DUR,KLOR-CON) 20 MEQ tablet  Take 80 mg am ,and 40 mg pm 07/31/13  Yes Herminio Commons, MD  rivaroxaban (XARELTO) 10 MG TABS tablet Take 1 tablet (10 mg total) by mouth daily. 09/11/13  Yes Herminio Commons, MD  torsemide (DEMADEX) 20 MG tablet Take 40 mg by mouth 2 (two) times daily. 02/06/13  Yes Asencion Noble, MD  traZODone (DESYREL) 50 MG tablet  08/16/13  Yes Historical Provider, MD  warfarin (COUMADIN) 5 MG tablet Take 2.5-5 mg by mouth See admin instructions. **Take one-half tablet on Mondays and Thursdays. Takes one tablet on all other days.**    Historical Provider, MD   BP 97/77  Pulse 116  Temp(Src) 97.3 F (36.3 C) (Oral)  Resp 22  Ht 5\' 10"  (1.778 m)  Wt 159 lb 6.4  oz (72.303 kg)  BMI 22.87 kg/m2  SpO2 100% Physical Exam CONSTITUTIONAL: Well developed/well nourished HEAD: Normocephalic/atraumatic EYES: EOMI/PERRL ENMT: Mucous membranes moist, nasal congestion NECK: supple no meningeal signs SPINE:entire spine nontender CV: S1/S2 noted LUNGS: Lungs are clear to auscultation bilaterally, no apparent distress ABDOMEN: soft, nontender, no rebound or guarding NEURO: Pt is awake/alert, moves all extremitiesx4 EXTREMITIES: pulses normal, full ROM, symmetric pitting edema in bilateral LE SKIN: warm, color normal PSYCH: no abnormalities of mood noted  ED Course  Procedures  2:57 AM Pt with h/o chronic CHF with ICD in place (no recent ICD shock, usually has low BP due to severe CHF) Here with consistent cough despite home antibiotics He is in no distress at this time Will obtain repeat CXR 3:40 AM Pt well appearing, walking around room, drinking water and in no distress CXR is improved He is already on levaquin as well as mucinex.  I hesitate to start albuterol due to his multiple cardiac issues including atrial fibrillation He is not actively coughing at this time He will increase use of mucinex He is otherwise well appearing and stable for d/c home \ Imaging Review Dg Chest 2 View  09/21/2013   CLINICAL DATA:  Cough.  EXAM: CHEST  2 VIEW  COMPARISON:  09/17/2013  FINDINGS: Cardiac enlargement. Pulmonary vascularity is normal. Stable appearance of cardiac pacemaker. Infiltrative changes seen previously in the right mid lung are not demonstrated today. Probable emphysematous changes in the lungs. No focal consolidation. No blunting of costophrenic angles. Tortuous aorta.  IMPRESSION: Emphysematous changes in the lungs. Cardiac enlargement. No evidence of active pulmonary disease.   Electronically Signed   By: Lucienne Capers M.D.   On: 09/21/2013 03:23      MDM   Final diagnoses:  Cough    Nursing notes including past medical history and  social history reviewed and considered in documentation xrays reviewed and considered Previous records reviewed and considered - previous xray reviewed     Sharyon Cable, MD 09/21/13 [phone]

## 2013-09-21 NOTE — Discharge Instructions (Signed)
Cough, Adult  A cough is a reflex. It helps you clear your throat and airways. A cough can help heal your body. A cough can last 2 or 3 weeks (acute) or may last more than 8 weeks (chronic). Some common causes of a cough can include an infection, allergy, or a cold. HOME CARE  Only take medicine as told by your doctor.  If given, take your medicines (antibiotics) as told. Finish them even if you start to feel better.  Use a cold steam vaporizer or humidier in your home. This can help loosen thick spit (secretions).  Sleep so you are almost sitting up (semi-upright). Use pillows to do this. This helps reduce coughing.  Rest as needed.  Stop smoking if you smoke. GET HELP RIGHT AWAY IF:  You have yellowish-white fluid (pus) in your thick spit.  Your cough gets worse.  Your medicine does not reduce coughing, and you are losing sleep.  You cough up blood.  You have trouble breathing.  Your pain gets worse and medicine does not help.  You have a fever. MAKE SURE YOU:   Understand these instructions.  Will watch your condition.  Will get help right away if you are not doing well or get worse. Document Released: 01/07/2011 Document Revised: 07/19/2011 Document Reviewed: 01/07/2011 [employer] Patient Information 2014 [employer].  

## 2013-10-02 ENCOUNTER — Telehealth: Payer: Self-pay | Admitting: *Deleted

## 2013-10-02 NOTE — Telephone Encounter (Signed)
Pts SBP runs between 80-100 wife denied any c/o dizziness    Wil forward to Dr.koneswaran

## 2013-10-02 NOTE — Telephone Encounter (Signed)
Pt wife is wanting pt seen today, states that he still has a cough and his legs are heavy.

## 2013-10-02 NOTE — Telephone Encounter (Signed)
Wife declined apt for now.I suggested she gets an incentive spirometer to help with  pulmonary toilet.She wil touch base with me later in the week

## 2013-10-02 NOTE — Telephone Encounter (Signed)
Pt was seen in ED for cough and leg heaviness. He recently had pneumonia treated with levaquin which upon repeat film is improving.ED attending suggested using more Mucinex.According to wife they don't have any and will pick some up at [facility].The ED also gave pt tessalon pearls which do not help.He will not keep legs elevated when sitting and she cannot make him wear support hose because he doesn't want to show bare legs with shorts.His weight is unchanged at 161 lbs and has been dismissed from heart failure clinic as he is doing well. I suggested they touch base with pcp Dr.Fagan

## 2013-10-02 NOTE — Telephone Encounter (Signed)
Agree-can f/u with Dr. Willey Blade. If he then needs to be seen, can have him seen by Curt Bears.

## 2013-10-08 ENCOUNTER — Telehealth: Payer: Self-pay | Admitting: Cardiovascular Disease

## 2013-10-08 NOTE — Telephone Encounter (Signed)
Wife Mario Cline states her husband still has cough which is now productive with green sputum at times(no fever,bo sob).His weight is unchanged at 159 lbs.He was treated for pneumonia and has seen both Dr.Fagan and Dr.Hawkins,has been on Levaquin and Ceftin and Tessalon pearls.He has fu apt with you on 6/23 which is the earliest apt you have. I encouraged her to see Dr.Fagan again

## 2013-10-08 NOTE — Telephone Encounter (Signed)
Agree. Follow up with Dr. Willey Blade.

## 2013-10-08 NOTE — Telephone Encounter (Signed)
Patient's wife would like return call.  Did not state what it was in regards to. / tgs

## 2013-10-09 ENCOUNTER — Ambulatory Visit (HOSPITAL_COMMUNITY)
Admission: RE | Admit: 2013-10-09 | Discharge: 2013-10-09 | Disposition: A | Discharge status: Home / Self Care | Payer: Medicare Other | Source: Ambulatory Visit | Attending: Internal Medicine | Admitting: Internal Medicine

## 2013-10-09 ENCOUNTER — Other Ambulatory Visit (HOSPITAL_COMMUNITY): Payer: Self-pay | Admitting: Internal Medicine

## 2013-10-09 DIAGNOSIS — R05 Cough: Secondary | ICD-10-CM

## 2013-10-09 DIAGNOSIS — I517 Cardiomegaly: Secondary | ICD-10-CM | POA: Insufficient documentation

## 2013-10-09 DIAGNOSIS — J9819 Other pulmonary collapse: Secondary | ICD-10-CM | POA: Insufficient documentation

## 2013-10-09 DIAGNOSIS — J479 Bronchiectasis, uncomplicated: Secondary | ICD-10-CM | POA: Insufficient documentation

## 2013-10-09 DIAGNOSIS — R188 Other ascites: Secondary | ICD-10-CM | POA: Insufficient documentation

## 2013-10-09 DIAGNOSIS — R059 Cough, unspecified: Secondary | ICD-10-CM

## 2013-10-09 DIAGNOSIS — Z95810 Presence of automatic (implantable) cardiac defibrillator: Secondary | ICD-10-CM | POA: Insufficient documentation

## 2013-10-10 ENCOUNTER — Other Ambulatory Visit: Payer: Self-pay | Admitting: Cardiology

## 2013-10-10 DIAGNOSIS — I951 Orthostatic hypotension: Secondary | ICD-10-CM

## 2013-10-10 DIAGNOSIS — I1310 Hypertensive heart and chronic kidney disease without heart failure, with stage 1 through stage 4 chronic kidney disease, or unspecified chronic kidney disease: Secondary | ICD-10-CM

## 2013-10-10 DIAGNOSIS — Z7901 Long term (current) use of anticoagulants: Secondary | ICD-10-CM

## 2013-10-10 DIAGNOSIS — I5022 Chronic systolic (congestive) heart failure: Secondary | ICD-10-CM

## 2013-10-10 DIAGNOSIS — I428 Other cardiomyopathies: Secondary | ICD-10-CM

## 2013-10-14 ENCOUNTER — Inpatient Hospital Stay (HOSPITAL_COMMUNITY)
Admission: EM | Admit: 2013-10-14 | Discharge: 2013-10-17 | DRG: 378 | Disposition: A | Discharge status: Home / Self Care | Payer: Medicare Other | Attending: Internal Medicine | Admitting: Internal Medicine

## 2013-10-14 ENCOUNTER — Encounter (HOSPITAL_COMMUNITY): Payer: Self-pay | Admitting: Emergency Medicine

## 2013-10-14 ENCOUNTER — Emergency Department (HOSPITAL_COMMUNITY): Payer: Medicare Other

## 2013-10-14 DIAGNOSIS — D500 Iron deficiency anemia secondary to blood loss (chronic): Secondary | ICD-10-CM | POA: Diagnosis present

## 2013-10-14 DIAGNOSIS — E876 Hypokalemia: Secondary | ICD-10-CM | POA: Diagnosis present

## 2013-10-14 DIAGNOSIS — Z7901 Long term (current) use of anticoagulants: Secondary | ICD-10-CM

## 2013-10-14 DIAGNOSIS — I509 Heart failure, unspecified: Secondary | ICD-10-CM | POA: Diagnosis present

## 2013-10-14 DIAGNOSIS — I4891 Unspecified atrial fibrillation: Secondary | ICD-10-CM | POA: Diagnosis present

## 2013-10-14 DIAGNOSIS — Z800 Family history of malignant neoplasm of digestive organs: Secondary | ICD-10-CM

## 2013-10-14 DIAGNOSIS — K922 Gastrointestinal hemorrhage, unspecified: Secondary | ICD-10-CM

## 2013-10-14 DIAGNOSIS — I959 Hypotension, unspecified: Secondary | ICD-10-CM

## 2013-10-14 DIAGNOSIS — Z8249 Family history of ischemic heart disease and other diseases of the circulatory system: Secondary | ICD-10-CM

## 2013-10-14 DIAGNOSIS — K921 Melena: Principal | ICD-10-CM | POA: Diagnosis present

## 2013-10-14 DIAGNOSIS — I129 Hypertensive chronic kidney disease with stage 1 through stage 4 chronic kidney disease, or unspecified chronic kidney disease: Secondary | ICD-10-CM | POA: Diagnosis present

## 2013-10-14 DIAGNOSIS — N19 Unspecified kidney failure: Secondary | ICD-10-CM

## 2013-10-14 DIAGNOSIS — I429 Cardiomyopathy, unspecified: Secondary | ICD-10-CM

## 2013-10-14 DIAGNOSIS — I2510 Atherosclerotic heart disease of native coronary artery without angina pectoris: Secondary | ICD-10-CM | POA: Diagnosis present

## 2013-10-14 DIAGNOSIS — N184 Chronic kidney disease, stage 4 (severe): Secondary | ICD-10-CM | POA: Diagnosis present

## 2013-10-14 DIAGNOSIS — N179 Acute kidney failure, unspecified: Secondary | ICD-10-CM | POA: Diagnosis present

## 2013-10-14 DIAGNOSIS — M1990 Unspecified osteoarthritis, unspecified site: Secondary | ICD-10-CM | POA: Diagnosis present

## 2013-10-14 DIAGNOSIS — R17 Unspecified jaundice: Secondary | ICD-10-CM | POA: Diagnosis present

## 2013-10-14 DIAGNOSIS — I5022 Chronic systolic (congestive) heart failure: Secondary | ICD-10-CM

## 2013-10-14 DIAGNOSIS — G4733 Obstructive sleep apnea (adult) (pediatric): Secondary | ICD-10-CM | POA: Diagnosis present

## 2013-10-14 DIAGNOSIS — K319 Disease of stomach and duodenum, unspecified: Secondary | ICD-10-CM | POA: Diagnosis present

## 2013-10-14 DIAGNOSIS — Z95810 Presence of automatic (implantable) cardiac defibrillator: Secondary | ICD-10-CM

## 2013-10-14 DIAGNOSIS — E875 Hyperkalemia: Secondary | ICD-10-CM | POA: Diagnosis present

## 2013-10-14 DIAGNOSIS — D696 Thrombocytopenia, unspecified: Secondary | ICD-10-CM | POA: Diagnosis present

## 2013-10-14 DIAGNOSIS — I428 Other cardiomyopathies: Secondary | ICD-10-CM | POA: Diagnosis present

## 2013-10-14 HISTORY — DX: Essential (primary) hypertension: I10

## 2013-10-14 LAB — COMPREHENSIVE METABOLIC PANEL
ALK PHOS: 116 U/L (ref 39–117)
ALT: 25 U/L (ref 0–53)
AST: 42 U/L — ABNORMAL HIGH (ref 0–37)
Albumin: 3.5 g/dL (ref 3.5–5.2)
BUN: 98 mg/dL — ABNORMAL HIGH (ref 6–23)
CALCIUM: 9.3 mg/dL (ref 8.4–10.5)
CHLORIDE: 99 meq/L (ref 96–112)
CO2: 20 meq/L (ref 19–32)
Creatinine, Ser: 3.13 mg/dL — ABNORMAL HIGH (ref 0.50–1.35)
GFR calc Af Amer: 20 mL/min — ABNORMAL LOW (ref >90)
GFR, EST NON AFRICAN AMERICAN: 17 mL/min — AB (ref >90)
GLUCOSE: 109 mg/dL — AB (ref 70–99)
Potassium: 5.9 mEq/L — ABNORMAL HIGH (ref 3.7–5.3)
SODIUM: 135 meq/L — AB (ref 137–147)
Total Bilirubin: 2 mg/dL — ABNORMAL HIGH (ref 0.3–1.2)
Total Protein: 6.2 g/dL (ref 6.0–8.3)

## 2013-10-14 LAB — CBC WITH DIFFERENTIAL/PLATELET
Basophils Absolute: 0 10*3/uL (ref 0.0–0.1)
Basophils Relative: 0 % (ref 0–1)
EOS PCT: 1 % (ref 0–5)
Eosinophils Absolute: 0 10*3/uL (ref 0.0–0.7)
HEMATOCRIT: 36.9 % — AB (ref 39.0–52.0)
Hemoglobin: 12.5 g/dL — ABNORMAL LOW (ref 13.0–17.0)
Lymphocytes Relative: 21 % (ref 12–46)
Lymphs Abs: 1.3 10*3/uL (ref 0.7–4.0)
MCH: 35.8 pg — AB (ref 26.0–34.0)
MCHC: 33.9 g/dL (ref 30.0–36.0)
MCV: 105.7 fL — AB (ref 78.0–100.0)
MONO ABS: 0.6 10*3/uL (ref 0.1–1.0)
Monocytes Relative: 10 % (ref 3–12)
Neutro Abs: 4.2 10*3/uL (ref 1.7–7.7)
Neutrophils Relative %: 68 % (ref 43–77)
Platelets: 124 10*3/uL — ABNORMAL LOW (ref 150–400)
RBC: 3.49 MIL/uL — ABNORMAL LOW (ref 4.22–5.81)
RDW: 16.6 % — AB (ref 11.5–15.5)
WBC: 6.1 10*3/uL (ref 4.0–10.5)

## 2013-10-14 LAB — PRO B NATRIURETIC PEPTIDE: Pro B Natriuretic peptide (BNP): 16323 pg/mL — ABNORMAL HIGH (ref 0–450)

## 2013-10-14 LAB — TYPE AND SCREEN
ABO/RH(D): O POS
Antibody Screen: NEGATIVE

## 2013-10-14 LAB — LIPASE, BLOOD: Lipase: 66 U/L — ABNORMAL HIGH (ref 11–59)

## 2013-10-14 LAB — POC OCCULT BLOOD, ED: Fecal Occult Bld: POSITIVE — AB

## 2013-10-14 LAB — PROTIME-INR
INR: 3.77 — ABNORMAL HIGH (ref 0.00–1.49)
Prothrombin Time: 35.8 seconds — ABNORMAL HIGH (ref 11.6–15.2)

## 2013-10-14 MED ORDER — SODIUM CHLORIDE 0.9 % IV SOLN
1.0000 g | Freq: Once | INTRAVENOUS | Status: AC
  Administered 2013-10-14: 1 g via INTRAVENOUS
  Filled 2013-10-14: qty 10

## 2013-10-14 MED ORDER — ONDANSETRON HCL 4 MG PO TABS: 4.0000 mg | ORAL_TABLET | Freq: Four times a day (QID) | ORAL | Status: DC | PRN

## 2013-10-14 MED ORDER — ONDANSETRON HCL 4 MG/2ML IJ SOLN
4.0000 mg | Freq: Once | INTRAMUSCULAR | Status: AC
  Administered 2013-10-14: 4 mg via INTRAVENOUS
  Filled 2013-10-14: qty 2

## 2013-10-14 MED ORDER — HYDROCODONE-ACETAMINOPHEN 5-325 MG PO TABS: 1.0000 | ORAL_TABLET | ORAL | Status: DC | PRN

## 2013-10-14 MED ORDER — ALLOPURINOL 100 MG PO TABS
100.0000 mg | ORAL_TABLET | Freq: Two times a day (BID) | ORAL | Status: DC
Start: 2013-10-14 — End: 2013-10-17
  Administered 2013-10-15 – 2013-10-17 (×6): 100 mg via ORAL
  Filled 2013-10-14 (×6): qty 1

## 2013-10-14 MED ORDER — FLUTICASONE FUROATE 27.5 MCG/SPRAY NA SUSP: 2.0000 | Freq: Every day | NASAL | Status: DC

## 2013-10-14 MED ORDER — ADULT MULTIVITAMIN W/MINERALS CH
1.0000 | ORAL_TABLET | Freq: Every day | ORAL | Status: DC
  Administered 2013-10-15 – 2013-10-16 (×2): 1 via ORAL
  Filled 2013-10-14 (×2): qty 1

## 2013-10-14 MED ORDER — SODIUM POLYSTYRENE SULFONATE 15 GM/60ML PO SUSP
45.0000 g | Freq: Once | ORAL | Status: AC
  Administered 2013-10-15: 45 g via ORAL
  Filled 2013-10-14: qty 180

## 2013-10-14 MED ORDER — SODIUM CHLORIDE 0.9 % IV SOLN
INTRAVENOUS | Status: DC
Start: 2013-10-15 — End: 2013-10-14
  Administered 2013-10-14: 22:00:00 via INTRAVENOUS

## 2013-10-14 MED ORDER — VITAMIN B-1 100 MG PO TABS
100.0000 mg | ORAL_TABLET | Freq: Every day | ORAL | Status: DC
  Administered 2013-10-15: 100 mg via ORAL
  Filled 2013-10-14: qty 1

## 2013-10-14 MED ORDER — SODIUM CHLORIDE 0.9 % IJ SOLN
3.0000 mL | Freq: Two times a day (BID) | INTRAMUSCULAR | Status: DC
  Administered 2013-10-14 – 2013-10-16 (×5): 3 mL via INTRAVENOUS

## 2013-10-14 MED ORDER — ACETAMINOPHEN 325 MG PO TABS: 650.0000 mg | ORAL_TABLET | Freq: Four times a day (QID) | ORAL | Status: DC | PRN

## 2013-10-14 MED ORDER — FOLIC ACID 1 MG PO TABS
1.0000 mg | ORAL_TABLET | Freq: Every day | ORAL | Status: DC
  Administered 2013-10-15 – 2013-10-16 (×2): 1 mg via ORAL
  Filled 2013-10-14 (×2): qty 1

## 2013-10-14 MED ORDER — BISACODYL 10 MG RE SUPP: 10.0000 mg | Freq: Every day | RECTAL | Status: DC | PRN

## 2013-10-14 MED ORDER — SODIUM CHLORIDE 0.9 % IV SOLN
INTRAVENOUS | Status: DC
Start: 2013-10-14 — End: 2013-10-17
  Administered 2013-10-15: via INTRAVENOUS

## 2013-10-14 MED ORDER — CARVEDILOL 3.125 MG PO TABS
3.1250 mg | ORAL_TABLET | Freq: Two times a day (BID) | ORAL | Status: DC
Start: 2013-10-15 — End: 2013-10-17
  Administered 2013-10-15 – 2013-10-17 (×4): 3.125 mg via ORAL
  Filled 2013-10-14 (×4): qty 1

## 2013-10-14 MED ORDER — ONDANSETRON HCL 4 MG/2ML IJ SOLN: 4.0000 mg | Freq: Four times a day (QID) | INTRAMUSCULAR | Status: DC | PRN

## 2013-10-14 MED ORDER — FLUTICASONE PROPIONATE 50 MCG/ACT NA SUSP
2.0000 | Freq: Every day | NASAL | Status: DC
  Administered 2013-10-15 – 2013-10-17 (×2): 2 via NASAL
  Filled 2013-10-14: qty 16

## 2013-10-14 MED ORDER — ACETAMINOPHEN 650 MG RE SUPP: 650.0000 mg | Freq: Four times a day (QID) | RECTAL | Status: DC | PRN

## 2013-10-14 MED ORDER — ALUM & MAG HYDROXIDE-SIMETH 200-200-20 MG/5ML PO SUSP: 30.0000 mL | Freq: Four times a day (QID) | ORAL | Status: DC | PRN

## 2013-10-14 MED ORDER — DOCUSATE SODIUM 100 MG PO CAPS
100.0000 mg | ORAL_CAPSULE | Freq: Two times a day (BID) | ORAL | Status: DC
  Administered 2013-10-15 – 2013-10-16 (×4): 100 mg via ORAL
  Filled 2013-10-14 (×5): qty 1

## 2013-10-14 MED ORDER — SODIUM CHLORIDE 0.9 % IV BOLUS (SEPSIS)
1000.0000 mL | Freq: Once | INTRAVENOUS | Status: AC
  Administered 2013-10-14: 1000 mL via INTRAVENOUS

## 2013-10-14 NOTE — ED Notes (Signed)
Per spouse patient had a large black tarry stool , she is concerned he may bleed out because he is on xarelto

## 2013-10-14 NOTE — ED Provider Notes (Signed)
CSN: [id number]     Arrival date & time 10/14/13  1826 History   First MD Initiated Contact with Patient 10/14/13 1854     Chief Complaint  Patient presents with  . Melena     (Consider location/radiation/quality/duration/timing/severity/associated sxs/prior Treatment) The history is provided by the patient.   80 year old male brought in by family. The patient had a black large bowel movement prior to arrival. They recognize that this could be blood. Patient is on blood thinners. Patient has a history of atrial fib has a pacemaker and defibrillator followed by [facility] cardiology here. Patient doesn't get up on his feet much but denied any lightheadedness or feeling like you pass out denied any abdominal pain. Patient at triage was noted to be hypotensive family states that his blood pressures normally around 90 systolic. No history of GI bleed in the past.  Past Medical History  Diagnosis Date  . Nonischemic cardiomyopathy     Cath in 1997-nonobstructive disease with normal EF; h/o CHF; EF-45% in '03, 25% in '08, 25-30% in '11;   . Hypertension   . Orthostatic hypotension   . Obstructive sleep apnea 2002    CPAP prescribed  . DJD (degenerative joint disease)   . AICD (automatic cardioverter/defibrillator) present 06/2007    AICD/dual-chamber pacemaker - (St. Jude)- 2/09;   . Atrial fibrillation 04/2009    Rare brief episodes by ICD interrogation until 03/2011->persistent  . Chronic anticoagulation   . Chronic kidney disease, stage III (moderate)     Cardiorenal disease.   Past Surgical History  Procedure Laterality Date  . Tonsillectomy    . Pacemaker insertion    . Cholecystectomy    . Posterior laminectomy / decompression lumbar spine  1985    +fusion; Dr. Pearlie Oyster  . Cardiac defibrillator placement  06/2007   Family History  Problem Relation Age of Onset  . Heart failure Mother   . Heart attack Father   . Cancer Brother     x2   History  Substance Use Topics  . Smoking  status: Never Smoker   . Smokeless tobacco: Never Used  . Alcohol Use: 0.5 oz/week    1 drink(s) per week    Review of Systems  Constitutional: Positive for appetite change. Negative for fever.  HENT: Negative for congestion.   Eyes: Negative for visual disturbance.  Respiratory: Negative for shortness of breath.   Cardiovascular: Negative for chest pain.  Gastrointestinal: Positive for blood in stool. Negative for nausea, vomiting and abdominal pain.  Genitourinary: Negative for dysuria and hematuria.  Musculoskeletal: Negative for back pain.  Skin: Negative for rash.  Neurological: Negative for headaches.  Hematological: Bruises/bleeds easily.  Psychiatric/Behavioral: Negative for confusion.      Allergies  Review of patient's allergies indicates no known allergies.  Home Medications   Prior to Admission medications   Medication Sig Start Date End Date Taking? Authorizing Provider  allopurinol (ZYLOPRIM) 100 MG tablet Take 100 mg by mouth 2 (two) times daily. 06/07/13  Yes Jolaine Artist, MD  carvedilol (COREG) 6.25 MG tablet Take 3.125 mg by mouth 2 (two) times daily with a meal.   Yes Historical Provider, MD  fluticasone (VERAMYST) 27.5 MCG/SPRAY nasal spray Place 2 sprays into the nose daily.   Yes Historical Provider, MD  metolazone (ZAROXOLYN) 2.5 MG tablet Take 2.5 mg by mouth 3 (three) times a week. EVERY MON  Wed and FRI 07/09/13  Yes Amy D Clegg, NP  moxifloxacin (AVELOX) 400 MG tablet Take 400 mg  by mouth daily.    Yes Historical Provider, MD  potassium chloride SA (K-DUR,KLOR-CON) 20 MEQ tablet Take 40 mEq by mouth 3 (three) times daily.   Yes Historical Provider, MD  rivaroxaban (XARELTO) 10 MG TABS tablet Take 1 tablet (10 mg total) by mouth daily. 09/11/13  Yes Herminio Commons, MD  torsemide (DEMADEX) 20 MG tablet Take 40 mg by mouth 2 (two) times daily. 02/06/13  Yes Asencion Noble, MD   BP 87/69  Pulse 114  Temp(Src) 97.5 F (36.4 C) (Oral)  Resp 14  Ht 5'  10" (1.778 m)  Wt 160 lb (72.576 kg)  BMI 22.96 kg/m2  SpO2 97% Physical Exam  Constitutional: He is oriented to person, place, and time. He appears well-developed and well-nourished. No distress.  HENT:  Head: Normocephalic and atraumatic.  Mouth/Throat: Oropharynx is clear and moist.  Eyes: Conjunctivae and EOM are normal. Pupils are equal, round, and reactive to light.  Neck: Normal range of motion. Neck supple.  Cardiovascular: Normal rate and normal heart sounds.   No murmur heard. Irregular  Pulmonary/Chest: Effort normal and breath sounds normal. No respiratory distress.  Abdominal: Soft. Bowel sounds are normal. There is no tenderness.  Genitourinary: Guaiac positive stool.  Minimal stool in the rectal vault. Some external skin tags no evidence of external hemorrhoids are prolapsed internal hemorrhoids. No gross blood. It was Hemoccult positive though.  Musculoskeletal: Normal range of motion. He exhibits no edema.  Neurological: He is alert and oriented to person, place, and time. No cranial nerve deficit. He exhibits normal muscle tone. Coordination normal.  Skin: Skin is warm. No rash noted.    ED Course  Procedures (including critical care time) Labs Review Labs Reviewed  COMPREHENSIVE METABOLIC PANEL - Abnormal; Notable for the following:    Sodium 135 (*)    Potassium 5.9 (*)    Glucose, Bld 109 (*)    BUN 98 (*)    Creatinine, Ser 3.13 (*)    AST 42 (*)    Total Bilirubin 2.0 (*)    GFR calc non Af Amer 17 (*)    GFR calc Af Amer 20 (*)    All other components within normal limits  PRO B NATRIURETIC PEPTIDE - Abnormal; Notable for the following:    Pro B Natriuretic peptide (BNP) 16323.0 (*)    All other components within normal limits  LIPASE, BLOOD - Abnormal; Notable for the following:    Lipase 66 (*)    All other components within normal limits  CBC WITH DIFFERENTIAL - Abnormal; Notable for the following:    RBC 3.49 (*)    Hemoglobin 12.5 (*)    HCT  36.9 (*)    MCV 105.7 (*)    MCH 35.8 (*)    RDW 16.6 (*)    Platelets 124 (*)    All other components within normal limits  PROTIME-INR - Abnormal; Notable for the following:    Prothrombin Time 35.8 (*)    INR 3.77 (*)    All other components within normal limits  POC OCCULT BLOOD, ED  TYPE AND SCREEN   Results for orders placed during the hospital encounter of 10/14/13  COMPREHENSIVE METABOLIC PANEL      Result Value Ref Range   Sodium 135 (*) 137 - 147 mEq/L   Potassium 5.9 (*) 3.7 - 5.3 mEq/L   Chloride 99  96 - 112 mEq/L   CO2 20  19 - 32 mEq/L   Glucose, Bld 109 (*)  70 - 99 mg/dL   BUN 98 (*) 6 - 23 mg/dL   Creatinine, Ser 3.13 (*) 0.50 - 1.35 mg/dL   Calcium 9.3  8.4 - 10.5 mg/dL   Total Protein 6.2  6.0 - 8.3 g/dL   Albumin 3.5  3.5 - 5.2 g/dL   AST 42 (*) 0 - 37 U/L   ALT 25  0 - 53 U/L   Alkaline Phosphatase 116  39 - 117 U/L   Total Bilirubin 2.0 (*) 0.3 - 1.2 mg/dL   GFR calc non Af Amer 17 (*) >90 mL/min   GFR calc Af Amer 20 (*) >90 mL/min  PRO B NATRIURETIC PEPTIDE      Result Value Ref Range   Pro B Natriuretic peptide (BNP) 16323.0 (*) 0 - 450 pg/mL  LIPASE, BLOOD      Result Value Ref Range   Lipase 66 (*) 11 - 59 U/L  CBC WITH DIFFERENTIAL      Result Value Ref Range   WBC 6.1  4.0 - 10.5 K/uL   RBC 3.49 (*) 4.22 - 5.81 MIL/uL   Hemoglobin 12.5 (*) 13.0 - 17.0 g/dL   HCT 36.9 (*) 39.0 - 52.0 %   MCV 105.7 (*) 78.0 - 100.0 fL   MCH 35.8 (*) 26.0 - 34.0 pg   MCHC 33.9  30.0 - 36.0 g/dL   RDW 16.6 (*) 11.5 - 15.5 %   Platelets 124 (*) 150 - 400 K/uL   Neutrophils Relative % 68  43 - 77 %   Neutro Abs 4.2  1.7 - 7.7 K/uL   Lymphocytes Relative 21  12 - 46 %   Lymphs Abs 1.3  0.7 - 4.0 K/uL   Monocytes Relative 10  3 - 12 %   Monocytes Absolute 0.6  0.1 - 1.0 K/uL   Eosinophils Relative 1  0 - 5 %   Eosinophils Absolute 0.0  0.0 - 0.7 K/uL   Basophils Relative 0  0 - 1 %   Basophils Absolute 0.0  0.0 - 0.1 K/uL  PROTIME-INR      Result Value  Ref Range   Prothrombin Time 35.8 (*) 11.6 - 15.2 seconds   INR 3.77 (*) 0.00 - 1.49  POC OCCULT BLOOD, ED      Result Value Ref Range   Fecal Occult Bld POSITIVE (*) NEGATIVE  TYPE AND SCREEN      Result Value Ref Range   ABO/RH(D) O POS     Antibody Screen NEG     Sample Expiration 10/17/2013       Imaging Review Dg Chest Port 1 View  10/14/2013   CLINICAL DATA:  Shortness of breath.  EXAM: PORTABLE CHEST - 1 VIEW  COMPARISON:  CT chest 10/09/2013 and plain film of the chest 09/21/2013.  FINDINGS: AICD is again seen. There is marked cardiomegaly but no edema. Lungs clear. No pneumothorax or pleural fluid.  IMPRESSION: Heart cardiomegaly without acute disease.   Electronically Signed   By: Inge Rise M.D.   On: 10/14/2013 19:40     EKG Interpretation   Date/Time:  Sunday October 14 2013 19:12:12 EDT Ventricular Rate:  101 PR Interval:    QRS Duration: 106 QT Interval:  334 QTC Calculation: 433 R Axis:   -67 Text Interpretation:  Atrial fibrillation with rapid ventricular response  Low voltage QRS Left anterior fascicular block Cannot rule out Anterior  infarct , age undetermined T wave abnormality, consider lateral ischemia  Abnormal ECG No significant  change since last tracing Confirmed by  Edwyn Inclan  MD, Tatumn Corbridge [phone]) on 10/14/2013 7:57:12 PM     CRITICAL CARE Performed by: Fredia Sorrow Total critical care time: 30 Critical care time was exclusive of separately billable procedures and treating other patients. Critical care was necessary to treat or prevent imminent or life-threatening deterioration. Critical care was time spent personally by me on the following activities: development of treatment plan with patient and/or surrogate as well as nursing, discussions with consultants, evaluation of patient's response to treatment, examination of patient, obtaining history from patient or surrogate, ordering and performing treatments and interventions, ordering and review of  laboratory studies, ordering and review of radiographic studies, pulse oximetry and re-evaluation of patient's condition.     MDM   Final diagnoses:  GI bleed  Hyperkalemia  Renal failure  Hypotension   Patient arrived hypotensive with history of black stools. Patient's blood pressure was systolic 75. However patient had minimal symptoms. Rectal exam without gross blood or black stool however was heme positive. Family state patient blood pressures normally around 90 systolic. Patient given a liter of fluid bring in blood pressure up to 87 systolic. Unexpected finding was the patient's renal function much worse than usual consistent with renal failure also potassium was 5.9. No acute EKG changes however patient given calcium gluconate to protect the heart. Patient has a history of cardiomegaly as the did not give additional fluids. Patient's hemoglobin and hematocrit reasonable to start with no evidence of significant blood loss based on appetite change with time. Patient is followed by Dr. Willey Blade and will require admission to that service. Consult with hospitalist to do the admission.    Fredia Sorrow, MD 10/14/13 2144

## 2013-10-14 NOTE — H&P (Signed)
[facility] History and Physical  Mario Cline WPY:[id number] DOB: 25-Jun-1928 DOA: 10/14/2013  Referring physician: Ria Comment, MD PCP: Asencion Noble, MD   Chief Complaint: GI bleed  HPI: Mario Cline is a 80 y.o. male with prior history of CHF presents to the ED with a very dark almost black bowel movement. Patient states this occurred just prior to coming to the ED. Patient has been on blood thinners for CHF and Atrial fibrillation. Patient states that he had no abdominal pain noted. He states he felt faint did not actually pass out. Patient was evaluated in the ED and noted to be hypotensive. He however has chronically low blood pressure per history. He denies having any chest pain no fevers or chills noted. In addition he has had a chronic cough which has been worked up in the past. Also the patient states that he has noted an increased abdominal girth but he has no prior liver disease per his recollection.   Review of Systems:  Constitutional:  No weight loss, night sweats HEENT:  No headaches  Cardio-vascular:  No chest pain, Orthopnea, PND GI:  No heartburn, indigestion, no nausea, vomiting, diarrhea  Resp:  ++shortness of breath with exertion. ++cough Skin:  no rash or lesions.  GU:  no dysuria Musculoskeletal:  No joint pain or swelling. No decreased range of motion. No back pain.  Psych:  No change in mood or affect. No depression or anxiety.  Past Medical History  Diagnosis Date  . Nonischemic cardiomyopathy     Cath in 1997-nonobstructive disease with normal EF; h/o CHF; EF-45% in '03, 25% in '08, 25-30% in '11;   . Hypertension   . Orthostatic hypotension   . Obstructive sleep apnea 2002    CPAP prescribed  . DJD (degenerative joint disease)   . AICD (automatic cardioverter/defibrillator) present 06/2007    AICD/dual-chamber pacemaker - (St. Jude)- 2/09;   . Atrial fibrillation 04/2009    Rare brief episodes by ICD interrogation until 03/2011->persistent   . Chronic anticoagulation   . Chronic kidney disease, stage III (moderate)     Cardiorenal disease.   Past Surgical History  Procedure Laterality Date  . Tonsillectomy    . Pacemaker insertion    . Cholecystectomy    . Posterior laminectomy / decompression lumbar spine  1985    +fusion; Dr. Pearlie Oyster  . Cardiac defibrillator placement  06/2007   Social History:  reports that he has never smoked. He has never used smokeless tobacco. He reports that he drinks about .5 ounces of alcohol per week. He reports that he does not use illicit drugs.  No Known Allergies  Family History  Problem Relation Age of Onset  . Heart failure Mother   . Heart attack Father   . Cancer Brother     x2     Prior to Admission medications   Medication Sig Start Date End Date Taking? Authorizing Provider  allopurinol (ZYLOPRIM) 100 MG tablet Take 100 mg by mouth 2 (two) times daily. 06/07/13  Yes Jolaine Artist, MD  carvedilol (COREG) 6.25 MG tablet Take 3.125 mg by mouth 2 (two) times daily with a meal.   Yes Historical Provider, MD  fluticasone (VERAMYST) 27.5 MCG/SPRAY nasal spray Place 2 sprays into the nose daily.   Yes Historical Provider, MD  metolazone (ZAROXOLYN) 2.5 MG tablet Take 2.5 mg by mouth 3 (three) times a week. EVERY MON  Wed and FRI 07/09/13  Yes Amy D Clegg, NP  moxifloxacin (AVELOX) 400  MG tablet Take 400 mg by mouth daily. For 10 days   Yes Historical Provider, MD  potassium chloride SA (K-DUR,KLOR-CON) 20 MEQ tablet Take 40 mEq by mouth 3 (three) times daily.   Yes Historical Provider, MD  rivaroxaban (XARELTO) 10 MG TABS tablet Take 1 tablet (10 mg total) by mouth daily. 09/11/13  Yes Herminio Commons, MD  torsemide (DEMADEX) 20 MG tablet Take 40 mg by mouth 2 (two) times daily. 02/06/13  Yes Asencion Noble, MD   Physical Exam: Filed Vitals:   10/14/13 2100  BP: 87/69  Pulse:   Temp:   Resp: 14    BP 87/69  Pulse 114  Temp(Src) 97.5 F (36.4 C) (Oral)  Resp 14  Ht 5\' 10"   (1.778 m)  Wt 72.576 kg (160 lb)  BMI 22.96 kg/m2  SpO2 97%  General:  Appears calm and comfortable Eyes: PERRL, normal lids, irises & conjunctiva ENT: grossly normal hearing, lips & tongue Neck: no LAD, masses or thyromegaly Cardiovascular: IRR, no m/r/g. ++LE edema. Respiratory: CTA bilaterally, no w/r/r. Normal respiratory effort. Abdomen: soft, ntnd distended +fluid Skin: no rash or induration seen on limited exam Musculoskeletal: grossly normal tone BUE/BLE Psychiatric: grossly normal mood and affect, speech fluent and appropriate Neurologic: grossly non-focal.          Labs on Admission:  Basic Metabolic Panel:  Recent Labs Lab 10/14/13 1930  NA 135*  K 5.9*  CL 99  CO2 20  GLUCOSE 109*  BUN 98*  CREATININE 3.13*  CALCIUM 9.3   Liver Function Tests:  Recent Labs Lab 10/14/13 1930  AST 42*  ALT 25  ALKPHOS 116  BILITOT 2.0*  PROT 6.2  ALBUMIN 3.5    Recent Labs Lab 10/14/13 1930  LIPASE 66*   No results found for this basename: AMMONIA,  in the last 168 hours CBC:  Recent Labs Lab 10/14/13 1930  WBC 6.1  NEUTROABS 4.2  HGB 12.5*  HCT 36.9*  MCV 105.7*  PLT 124*   Cardiac Enzymes: No results found for this basename: CKTOTAL, CKMB, CKMBINDEX, TROPONINI,  in the last 168 hours  BNP (last 3 results)  Recent Labs  02/02/13 1811 02/03/13 0605 10/14/13 1930  PROBNP 7763.0* 9243.0* 16323.0*   CBG: No results found for this basename: GLUCAP,  in the last 168 hours  Radiological Exams on Admission: Dg Chest Port 1 View  10/14/2013   CLINICAL DATA:  Shortness of breath.  EXAM: PORTABLE CHEST - 1 VIEW  COMPARISON:  CT chest 10/09/2013 and plain film of the chest 09/21/2013.  FINDINGS: AICD is again seen. There is marked cardiomegaly but no edema. Lungs clear. No pneumothorax or pleural fluid.  IMPRESSION: Heart cardiomegaly without acute disease.   Electronically Signed   By: Inge Rise M.D.   On: 10/14/2013 19:40    EKG:  Independently reviewed. A Fib  Assessment/Plan Principal Problem:   GI bleed Active Problems:   Atrial fibrillation   Chronic anticoagulation   Cardiomyopathy   Hypotension   1. Acute GI bleed -will admit to step down -check cbc in am -GI consult for evaluation  2. Atrial fibrillation -currently rate controlled -will continue with home medications  3. Hypotension -he appears to have chronic low pressures -will hold diuretics for now -slow hydration  4. Cardiomyopathy -will get a repeat echo in am -holding diuretics due to low pressures -will need to monitor closely  5. Hyperkalemia -will repet labs in am -will give dose of kayexalate  6. Elevated  BUN/Creatinine -will hold diuretics for now -hydrate slowly    Code Status: Full code (must indicate code status--if unknown or must be presumed, indicate so) Family Communication: wife (indicate person spoken with, if applicable, with phone number if by telephone) Disposition Plan: Home (indicate anticipated LOS)  Time spent: 71min  Saadat A Khan [facility] Pager [phone]  **Disclaimer: This note may have been dictated with voice recognition software. Similar sounding words can inadvertently be transcribed and this note may contain transcription errors which may not have been corrected upon publication of note.**

## 2013-10-15 ENCOUNTER — Encounter: Payer: Medicare Other | Admitting: *Deleted

## 2013-10-15 ENCOUNTER — Telehealth: Payer: Self-pay | Admitting: Cardiology

## 2013-10-15 ENCOUNTER — Encounter (HOSPITAL_COMMUNITY)
Admission: EM | Disposition: A | Discharge status: Home / Self Care | Payer: Self-pay | Source: Home / Self Care | Attending: Internal Medicine

## 2013-10-15 ENCOUNTER — Encounter (HOSPITAL_COMMUNITY): Payer: Self-pay | Admitting: Cardiology

## 2013-10-15 DIAGNOSIS — K2970 Gastritis, unspecified, without bleeding: Secondary | ICD-10-CM

## 2013-10-15 DIAGNOSIS — K2990 Gastroduodenitis, unspecified, without bleeding: Secondary | ICD-10-CM

## 2013-10-15 DIAGNOSIS — Z95810 Presence of automatic (implantable) cardiac defibrillator: Secondary | ICD-10-CM

## 2013-10-15 DIAGNOSIS — D649 Anemia, unspecified: Secondary | ICD-10-CM

## 2013-10-15 DIAGNOSIS — K922 Gastrointestinal hemorrhage, unspecified: Secondary | ICD-10-CM

## 2013-10-15 DIAGNOSIS — K766 Portal hypertension: Secondary | ICD-10-CM

## 2013-10-15 DIAGNOSIS — Z7901 Long term (current) use of anticoagulants: Secondary | ICD-10-CM

## 2013-10-15 HISTORY — PX: ESOPHAGOGASTRODUODENOSCOPY: SHX5428

## 2013-10-15 LAB — COMPREHENSIVE METABOLIC PANEL
ALT: 20 U/L (ref 0–53)
AST: 34 U/L (ref 0–37)
Albumin: 3 g/dL — ABNORMAL LOW (ref 3.5–5.2)
Alkaline Phosphatase: 97 U/L (ref 39–117)
BILIRUBIN TOTAL: 2 mg/dL — AB (ref 0.3–1.2)
BUN: 94 mg/dL — AB (ref 6–23)
CHLORIDE: 105 meq/L (ref 96–112)
CO2: 23 mEq/L (ref 19–32)
CREATININE: 2.98 mg/dL — AB (ref 0.50–1.35)
Calcium: 8.7 mg/dL (ref 8.4–10.5)
GFR calc Af Amer: 21 mL/min — ABNORMAL LOW (ref >90)
GFR calc non Af Amer: 18 mL/min — ABNORMAL LOW (ref >90)
GLUCOSE: 99 mg/dL (ref 70–99)
Potassium: 4.1 mEq/L (ref 3.7–5.3)
Sodium: 143 mEq/L (ref 137–147)
Total Protein: 5.3 g/dL — ABNORMAL LOW (ref 6.0–8.3)

## 2013-10-15 LAB — CBC
HEMATOCRIT: 32.1 % — AB (ref 39.0–52.0)
HEMOGLOBIN: 10.8 g/dL — AB (ref 13.0–17.0)
MCH: 35.5 pg — AB (ref 26.0–34.0)
MCHC: 33.6 g/dL (ref 30.0–36.0)
MCV: 105.6 fL — AB (ref 78.0–100.0)
Platelets: 111 10*3/uL — ABNORMAL LOW (ref 150–400)
RBC: 3.04 MIL/uL — AB (ref 4.22–5.81)
RDW: 16.7 % — ABNORMAL HIGH (ref 11.5–15.5)
WBC: 4.4 10*3/uL (ref 4.0–10.5)

## 2013-10-15 LAB — HEMOGLOBIN A1C
Hgb A1c MFr Bld: 6.2 % — ABNORMAL HIGH (ref <5.7)
Mean Plasma Glucose: 131 mg/dL — ABNORMAL HIGH (ref <117)

## 2013-10-15 LAB — HEMOGLOBIN AND HEMATOCRIT, BLOOD
HEMATOCRIT: 34.2 % — AB (ref 39.0–52.0)
Hemoglobin: 11.7 g/dL — ABNORMAL LOW (ref 13.0–17.0)

## 2013-10-15 LAB — MRSA PCR SCREENING: MRSA by PCR: NEGATIVE

## 2013-10-15 LAB — TROPONIN I
Troponin I: <0.3 ng/mL (ref <0.30)
Troponin I: <0.3 ng/mL (ref <0.30)

## 2013-10-15 LAB — TSH: TSH: 2.12 u[IU]/mL (ref 0.350–4.500)

## 2013-10-15 LAB — PROTIME-INR
INR: 2.29 — ABNORMAL HIGH (ref 0.00–1.49)
PROTHROMBIN TIME: 24.5 s — AB (ref 11.6–15.2)

## 2013-10-15 LAB — OCCULT BLOOD X 1 CARD TO LAB, STOOL: Fecal Occult Bld: POSITIVE — AB

## 2013-10-15 LAB — GLUCOSE, CAPILLARY: GLUCOSE-CAPILLARY: 87 mg/dL (ref 70–99)

## 2013-10-15 SURGERY — EGD (ESOPHAGOGASTRODUODENOSCOPY)
Anesthesia: Moderate Sedation

## 2013-10-15 MED ORDER — MIDAZOLAM HCL 5 MG/5ML IJ SOLN
INTRAMUSCULAR | Status: DC | PRN
  Administered 2013-10-15: 1 mg via INTRAVENOUS

## 2013-10-15 MED ORDER — MIDAZOLAM HCL 5 MG/5ML IJ SOLN
INTRAMUSCULAR | Status: AC
  Filled 2013-10-15: qty 10

## 2013-10-15 MED ORDER — STERILE WATER FOR IRRIGATION IR SOLN
Status: DC | PRN
  Administered 2013-10-15: 18:00:00

## 2013-10-15 MED ORDER — BUTAMBEN-TETRACAINE-BENZOCAINE 2-2-14 % EX AERO
INHALATION_SPRAY | CUTANEOUS | Status: DC | PRN
  Administered 2013-10-15: 2 via TOPICAL

## 2013-10-15 MED ORDER — SODIUM CHLORIDE 0.9 % IV SOLN: INTRAVENOUS | Status: DC

## 2013-10-15 MED ORDER — PANTOPRAZOLE SODIUM 40 MG IV SOLR
40.0000 mg | Freq: Two times a day (BID) | INTRAVENOUS | Status: DC
  Administered 2013-10-15 – 2013-10-16 (×4): 40 mg via INTRAVENOUS
  Filled 2013-10-15 (×4): qty 40

## 2013-10-15 MED ORDER — MEPERIDINE HCL 50 MG/ML IJ SOLN
INTRAMUSCULAR | Status: AC
  Filled 2013-10-15: qty 1

## 2013-10-15 NOTE — Care Management Note (Addendum)
    Page 1 of 1   10/17/2013     8:22:48 AM CARE MANAGEMENT NOTE 10/17/2013  Patient:  Mario Cline, Mario Cline   Account Number:  [id number]  Date Initiated:  10/15/2013  Documentation initiated by:  Theophilus Kinds  Subjective/Objective Assessment:   Pt admitted from home with gi bleed. Pt lives with his wife and will return home at discharge. Pts wife stated that pt is fairly independent with ADL's.     Action/Plan:   WIll continue to follow for discharge planning needs.   Anticipated DC Date:  10/18/2013   Anticipated DC Plan:  [place]  CM consult      Choice offered to / List presented to:             Status of service:  Completed, signed off Medicare Important Message given?  YES (If response is "NO", the following Medicare IM given date fields will be blank) Date Medicare IM given:  10/17/2013 Date Additional Medicare IM given:    Discharge Disposition:  HOME/SELF CARE  Per UR Regulation:    If discussed at Long Length of Stay Meetings, dates discussed:    Comments:  10/17/13 0820 Christinia Gully, RN BSN CM Pt discharged home today. No CM needs noted.  10/15/13 [place], RN BSN CM

## 2013-10-15 NOTE — Consult Note (Signed)
CARDIOLOGY CONSULT NOTE   Patient ID: Mario Cline MRN: [id number] DOB/AGE: 1928-07-11 80 y.o.  Admit Date: 10/14/2013 Referring Physician: Asencion Noble Primary Physician: Asencion Noble, MD Consulting Cardiologist: Owens Loffler MD Primary Cardiologist: Kate Sable MD CHF Cardiologist: Loralie Champagne MD Reason for Consultation: GI bleed on anticoagulation therapy in the setting of Atrial fib.  Clinical Summary [name] is an 80 y.o.male with known history of atrial fibrillation on Xarelto therapy (new, changed from coumadin one month ago), hypertension, chronic kidney disease (creatinine 2.1-2.5), obstructive sleep apnea on CPAP, systolic CHF, and NICM with EF of 15% - status post St. Jude ICD, most recent cath in 1997 with minimal nonobstructive CAD. Patient is also followed in the CHF clinic by Dr. Aundra Dubin. Per Mario Cline note, his dry weight 153 pounds.  Patient presented to the emergency room with complaints of coughing, episode of melena while at home. He states he has been coughing for approximately one month, not able to get anything up. He has been followed by Dr. Willey Blade has been given 3 courses of antibiotics. He also noticed some right foot and leg pain with swelling for the last 3 weeks. His wife states that he has had no appetite for the last 24 hours.   On arrival patient was found to be hypotensive with 7557, heart rate 17, O2 sat 100% , temperature 97.5. No nausea or vomiting. No abdominal pain. The patient was found to be anemic with hemoglobin of 10.8 hematocrit 32.1, platelets 111. She was mildly hyperkalemic with potassium of 5.9, 1.13. Pro BNP elevated 16,323. Cardiac enzymes are found be negative x2. Is found to be positive on Hemoccult stool. X-ray revealed cardiomegaly without CHF. Anticoagulation was held, we are asked for recommendations. GI consultation is underway. Has been started on a PPI. Also treated with IV fluid, calcium gluconate 1 g.  Currently, the  patient is stable with complaints of being hungry, wanting to eat.   No Known Allergies  Medications Scheduled Medications: . allopurinol  100 mg Oral BID  . carvedilol  3.125 mg Oral BID WC  . docusate sodium  100 mg Oral BID  . fluticasone  2 spray Each Nare Daily  . folic acid  1 mg Oral Daily  . multivitamin with minerals  1 tablet Oral Daily  . pantoprazole (PROTONIX) IV  40 mg Intravenous Q12H  . sodium chloride  3 mL Intravenous Q12H  . thiamine  100 mg Oral Daily    Infusions: . sodium chloride 50 mL/hr at 10/15/13 0001    PRN Medications: acetaminophen, acetaminophen, alum & mag hydroxide-simeth, bisacodyl, HYDROcodone-acetaminophen, ondansetron (ZOFRAN) IV, ondansetron   Past Medical History  Diagnosis Date  . Nonischemic cardiomyopathy     Nonobstructive CAD 1997, LVEF 10-15% March 2015  . Essential hypertension, benign   . Orthostatic hypotension   . Obstructive sleep apnea 2002    CPAP prescribed  . DJD (degenerative joint disease)   . AICD (automatic cardioverter/defibrillator) present 06/2007    AICD/dual-chamber pacemaker - (St. Jude)- 2/09;   . Atrial fibrillation 04/2009    Rare brief episodes by ICD interrogation until 03/2011->persistent  . Chronic anticoagulation   . Chronic kidney disease, stage III (moderate)     Cardiorenal disease.    Past Surgical History  Procedure Laterality Date  . Tonsillectomy    . Cholecystectomy    . Posterior laminectomy / decompression lumbar spine  1985    +fusion; Dr. Pearlie Oyster  . Cardiac defibrillator placement  06/2007  Family History  Problem Relation Age of Onset  . Heart failure Mother   . Heart attack Father   . Cancer Brother     x2    Social History Mario Cline reports that he has never smoked. He has never used smokeless tobacco. [name] reports that he drinks about .5 ounces of alcohol per week.  Review of Systems Otherwise reviewed and negative except as outlined.  Physical  Examination Blood pressure 81/53, pulse 137, temperature 97.9 F (36.6 C), temperature source Oral, resp. rate 17, height 5\' 8"  (1.727 m), weight 166 lb 10.7 oz (75.6 kg), SpO2 98.00%.  Intake/Output Summary (Last 24 hours) at 10/15/13 0921 Last data filed at 10/15/13 0800  Gross per 24 hour  Intake      0 ml  Output   1200 ml  Net  -1200 ml    Telemetry: Atrial fib with PVC;s   GEN: Hungry, resting, no acute distress HEENT: Conjunctiva and lids normal, oropharynx clear with moist mucosa. Neck: Supple, no elevated JVP or carotid bruits, no thyromegaly. Lungs: Clear to auscultation, nonlabored breathing at rest. Cardiac: Irregular rate and rhythm, no S3 or significant systolic murmur, no pericardial rub. Abdomen: Soft, nontender, no hepatomegaly, bowel sounds present, no guarding or rebound. Extremities:  He has stasis skin changes, cool to touch, with 2+ pitting edema bilaterally,  Skin: Warm and dry. Musculoskeletal: No kyphosis. Neuropsychiatric: Alert and oriented x3, affect grossly appropriate.   Prior Cardiac Testing/Procedures 1, ECHO (08/02/13):  - Left ventricle: The cavity size was moderately dilated. Wall thickness was increased in a pattern of mild LVH. Systolic function is severely reduced, estimated EF 10-15%. Severe diffuse hypokinesis is seen. The study was not technically sufficient to allow evaluation of LV diastolic dysfunction due to atrial fibrillation. Doppler parameters are consistent with both elevated ventricular end-diastolic filling pressure and elevated left atrial filling pressure. Medial E/e' is 23.1. - Aortic valve: Mildly calcified annulus. Moderately thickened leaflets. There was no stenosis. Mild regurgitation. - Aorta: The aorta was mildly calcified. Aortic root dimension: 4mm (ED). - Ascending aorta: The ascending aorta was mildly dilated. 4.09 cm in maximal diameter. - Mitral valve: Mildly dilated annulus. Moderately thickened leaflets  . Tethering of leaflet motion due to severe left ventricular dysfunction. Moderate regurgitation. Mild mitral annular calcification. - Left atrium: The atrium was moderately to severely dilated. - Right ventricle: The cavity size was moderately dilated. Pacer wire or catheter noted in right ventricle. Systolic function was moderately to severely reduced. - Right atrium: The atrium was moderately dilated. Pacer wire or catheter noted in right atrium. - Tricuspid valve: Mild regurgitation. - Pulmonary arteries: PA peak pressure: 46mm Hg (S). Moderately elevated pulmonary pressures. RAP 15 mmHg. - Inferior vena cava: The vessel was dilated; the respirophasic diameter changes were blunted (< 50%); findings are consistent with elevated central venous pressure.   Lab Results  Basic Metabolic Panel:  Recent Labs Lab 10/14/13 1930 10/15/13 0549  NA 135* 143  K 5.9* 4.1  CL 99 105  CO2 20 23  GLUCOSE 109* 99  BUN 98* 94*  CREATININE 3.13* 2.98*  CALCIUM 9.3 8.7    Liver Function Tests:  Recent Labs Lab 10/14/13 1930 10/15/13 0549  AST 42* 34  ALT 25 20  ALKPHOS 116 97  BILITOT 2.0* 2.0*  PROT 6.2 5.3*  ALBUMIN 3.5 3.0*    CBC:  Recent Labs Lab 10/14/13 1930 10/15/13 0549  WBC 6.1 4.4  NEUTROABS 4.2  --   HGB 12.5*  10.8*  HCT 36.9* 32.1*  MCV 105.7* 105.6*  PLT 124* 111*    Cardiac Enzymes:  Recent Labs Lab 10/15/13 0116 10/15/13 0549  TROPONINI <0.30 <0.30    BNP: 16,323.0  Radiology: Dg Chest Port 1 View  10/14/2013   CLINICAL DATA:  Shortness of breath.  EXAM: PORTABLE CHEST - 1 VIEW  COMPARISON:  CT chest 10/09/2013 and plain film of the chest 09/21/2013.  FINDINGS: AICD is again seen. There is marked cardiomegaly but no edema. Lungs clear. No pneumothorax or pleural fluid.  IMPRESSION: Heart cardiomegaly without acute disease.   Electronically Signed   By: Inge Rise M.D.   On: 10/14/2013 19:40    ECG:  Atrial fibrillation with rapid  ventricular response Left anterior fascicular block T wave abnormality, consider lateral ischemia   Impression and Recommendations  1. GI Bleed: Holding anticoagulation. Could potentially have colonoscopy.EDG today as he held his Xarelto yesterday and therefore has been off for approx 48 hours. Continue to monitor stool and H/H.  2. Atrial fibrillation: Patient is rate controlled on carvedilol 6.25 mg twice a day. Agree with stopping anticoagulation until source of bleeding is identified.  3. NICM with systolic dysfunction EF 70%: He is followed by Dr. Aundra Dubin and Dr. Lovena Le, in [place] [facility] in [place] [facility] with ICD pacemaker in situ. Currently 156 pounds. He remains a beta blocker, but has been taken off of diuretics temporarily in the setting of hypotension and GI bleed. Would reinstitute diuretics as soon as safe to do so concerning his hemodynamic status to avoid potential fluid overload and acute systolic CHF in the setting of significant systolic dysfunction.  3. Hypertension: Hypotensive currently. Normally hypotensive with low ejection fraction. Follow closely.  5. CKD: Creatinine on admission 3.13, improved with some hydration. Recommend nephrology evaluation if found to be medically necessary.   6. St. Jude ICD in situ: His recent interrogation in March of 2015. No plans to the caretaker and hospitalization.   Signed: Phill Myron. Lawrence NP  10/15/2013, 9:21 AM Co-Sign MD   Attending note:  Patient seen and examined. Reviewed records and modified above note by Ms. Lawrence NP. Mr. Mancuso presents with dark schools consistent with GI bleed, hemoglobin 10.8 at this point, down from 12.5. Platelets mildly low as well at 111. He has been on Xarelto for management of atrial fibrillation, on the 10 mg daily dose in light of stage IV CKD. Also coughing over the last few weeks, treated as an outpatient for bronchitis with antibiotics. Weight reportedly stable per wife, tries to keep it under  160 pounds. He denies any palpitations, device discharges, chest pain, or syncope. Pro-BNP elevated, although chest x-ray shows no pulmonary edema. Last dose of Xarelto was on Saturday, he has been n.p.o. pending GI evaluation and possible endoscopy. Stools are Hemoccult positive. Systolic blood pressure holding in the 80s, patient asymptomatic with this. We will follow with you pending further GI evaluation. Agree with holding anticoagulant for now. Follow hemodynamics and weight daily.  Satira Sark, M.D., F.A.C.C.

## 2013-10-15 NOTE — Telephone Encounter (Signed)
LMOVM reminding pt to send remote transmission.   

## 2013-10-15 NOTE — Consult Note (Signed)
Referring Provider: No ref. provider found Primary Care Physician:  Asencion Noble, MD Primary Gastroenterologist:  Dr. Laural Golden  Reason for Consultation:    Melena and anemia.  HPI:   Patient is an 80 year old Caucasian male who was admitted to Dr. Willey Blade service yesterday via the emergency room where he presented with acute onset of melena. Patient was admitted to ICU. History is provided by patient and his wife is at bedside. Patient was in usual state of poor health until one day prior to admission when he developed nausea without vomiting. He was nauseated yesterday morning as well and did not have good appetite. He did not experience abdominal pain. Wound 4 PM yesterday he passed tarry stool and was therefore brought to the emergency room for evaluation. Evaluation in the emergency room revealed heme positive stool but there was no gross blood on rectal exam. H&H was 12.5 and 36.9 with platelet count of 124K. INR was 3.77. Serum potassium was 5.9 and he was given dose of Kayexalate and had multiple bowel movements tonight. Last few stools were not black. Patient denies heartburn, dysphagia, abdominal pain or rectal bleeding. There is no history of peptic ulcer disease. His appetite has been fair until one day prior to admission. He has not lost any weight. Because of CHF his wife checks his weight every day as recommended by Dr. Bronson Ing. Patient does not take OTC NSAIDs. He has chronic weakness but able to ambulate some in the house. He has a walker which he does not use. His wife states his condition has deteriorated since September last year when he was admitted with CHF and lost 26 pounds. Because of family history of colon carcinoma he has undergone multiple colonoscopies in the last one was about 5 years ago at [facility].   Past Medical History  Diagnosis Date  . Nonischemic cardiomyopathy     Nonobstructive CAD 1997, LVEF 10-15% March 2015  . Essential hypertension,  benign   . Orthostatic hypotension   . Obstructive sleep apnea 2002    CPAP prescribed  . DJD (degenerative joint disease)   . AICD (automatic cardioverter/defibrillator) present 06/2007    AICD/dual-chamber pacemaker - (St. Jude)- 2/09;   . Atrial fibrillation 04/2009    Rare brief episodes by ICD interrogation until 03/2011->persistent  . Chronic anticoagulation   . Chronic kidney disease, stage III (moderate)     Cardiorenal disease.      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 Tonsillectomy    . Cholecystectomy    . Posterior laminectomy / decompression lumbar spine  1985    +fusion; Dr. Pearlie Oyster  . Cardiac defibrillator placement  06/2007   left cataract extraction 2 years ago.  Prior to Admission medications   Medication Sig Start Date End Date Taking? Authorizing Provider  allopurinol (ZYLOPRIM) 100 MG tablet Take 100 mg by mouth 2 (two) times daily. 06/07/13  Yes Jolaine Artist, MD  carvedilol (COREG) 6.25 MG tablet Take 3.125 mg by mouth 2 (two) times daily with a meal.   Yes Historical Provider, MD  fluticasone (VERAMYST) 27.5 MCG/SPRAY nasal spray Place 2 sprays into the nose daily.   Yes Historical Provider, MD  metolazone (ZAROXOLYN) 2.5 MG tablet Take 2.5 mg by mouth 3 (three) times a week. EVERY MON  Wed and FRI 07/09/13  Yes Amy D Clegg, NP  moxifloxacin (AVELOX) 400 MG tablet Take 400 mg by mouth daily. For 10  days   Yes Historical Provider, MD  potassium chloride SA (K-DUR,KLOR-CON) 20 MEQ tablet Take 40 mEq by mouth 3 (three) times daily.   Yes Historical Provider, MD  rivaroxaban (XARELTO) 10 MG TABS tablet Take 1 tablet (10 mg total) by mouth daily. 09/11/13  Yes Herminio Commons, MD  torsemide (DEMADEX) 20 MG tablet Take 40 mg by mouth 2 (two) times daily. 02/06/13  Yes Asencion Noble, MD    Current Facility-Administered Medications  Medication Dose Route Frequency Provider Last Rate Last Dose  . 0.9 %   sodium chloride infusion   Intravenous Continuous Asencion Noble, MD 10 mL/hr at 10/15/13 1044    . acetaminophen (TYLENOL) tablet 650 mg  650 mg Oral Q6H PRN Allyne Gee, MD       Or  . acetaminophen (TYLENOL) suppository 650 mg  650 mg Rectal Q6H PRN Allyne Gee, MD      . allopurinol (ZYLOPRIM) tablet 100 mg  100 mg Oral BID Allyne Gee, MD   100 mg at 10/15/13 1044  . alum & mag hydroxide-simeth (MAALOX/MYLANTA) 200-200-20 MG/5ML suspension 30 mL  30 mL Oral Q6H PRN Allyne Gee, MD      . bisacodyl (DULCOLAX) suppository 10 mg  10 mg Rectal Daily PRN Allyne Gee, MD      . carvedilol (COREG) tablet 3.125 mg  3.125 mg Oral BID WC Allyne Gee, MD   3.125 mg at 10/15/13 1044  . docusate sodium (COLACE) capsule 100 mg  100 mg Oral BID Allyne Gee, MD   100 mg at 10/15/13 0000  . fluticasone (FLONASE) 50 MCG/ACT nasal spray 2 spray  2 spray Each Nare Daily Allyne Gee, MD   2 spray at 10/15/13 1044  . folic acid (FOLVITE) tablet 1 mg  1 mg Oral Daily Allyne Gee, MD   1 mg at 10/15/13 1044  . HYDROcodone-acetaminophen (NORCO/VICODIN) 5-325 MG per tablet 1-2 tablet  1-2 tablet Oral Q4H PRN Allyne Gee, MD      . multivitamin with minerals tablet 1 tablet  1 tablet Oral Daily Allyne Gee, MD   1 tablet at 10/15/13 1044  . ondansetron (ZOFRAN) tablet 4 mg  4 mg Oral Q6H PRN Allyne Gee, MD       Or  . ondansetron [facility]) injection 4 mg  4 mg Intravenous Q6H PRN Allyne Gee, MD      . pantoprazole (PROTONIX) injection 40 mg  40 mg Intravenous Q12H Asencion Noble, MD   40 mg at 10/15/13 1043  . sodium chloride 0.9 % injection 3 mL  3 mL Intravenous Q12H Allyne Gee, MD   3 mL at 10/15/13 1045  . thiamine (VITAMIN B-1) tablet 100 mg  100 mg Oral Daily Allyne Gee, MD   100 mg at 10/15/13 1044    Allergies as of 10/14/2013  . (No Known Allergies)    Family History  Problem Relation Age of Onset  . Heart failure Mother   . Heart attack Father   . Cancer Brother     x2     History   Social History  . Marital Status: Married    Spouse Name: N/A    Number of Children: 2  . Years of Education: N/A   Occupational History  . Retired     Insurance account manager   Social History Main Topics  . Smoking status: Never Smoker   . Smokeless tobacco:  Never Used  . Alcohol Use: 0.5 oz/week    1 drink(s) per week  . Drug Use: No  . Sexual Activity: No   Other Topics Concern  . Not on file   Social History Narrative  . No narrative on file    Review of Systems: See HPI, otherwise normal ROS  Physical Exam: Temp:  [97.5 F (36.4 C)-97.9 F (36.6 C)] 97.9 F (36.6 C) (06/08 0808) Pulse Rate:  [102-137] 137 (06/08 0000) Resp:  [14-18] 17 (06/08 0000) BP: (75-88)/(53-70) 81/53 mmHg (06/08 0000) SpO2:  [89 %-100 %] 98 % (06/08 0000) Weight:  [160 lb (72.576 kg)-166 lb 10.7 oz (75.6 kg)] 166 lb 10.7 oz (75.6 kg) (06/08 0449) Last BM Date: 10/14/13  Pleasant well-developed thin Caucasian male who is in no acute distress. Conjunctivae was pink. Sclerae nonicteric. Oropharyngeal mucosa is normal. Neck masses or thyromegaly noted. Cardiac exam with a regular rhythm normal S1 and S2. He has grade 2/6 systolic ejection murmur best at LLSB. Lungs are clear to auscultation. Abdomen is protuberant with small umbilicus hernia which is completely reducible. All sounds are normal and no bruit noted. Abdomen is soft and nontender without organomegaly or masses. He has 1+ pitting edema involving both legs and is pigmentation to skin.   Lab Results:  Recent Labs  10/14/13 1930 10/15/13 0549  WBC 6.1 4.4  HGB 12.5* 10.8*  HCT 36.9* 32.1*  PLT 124* 111*   BMET  Recent Labs  10/14/13 1930 10/15/13 0549  NA 135* 143  K 5.9* 4.1  CL 99 105  CO2 20 23  GLUCOSE 109* 99  BUN 98* 94*  CREATININE 3.13* 2.98*  CALCIUM 9.3 8.7   LFT  Recent Labs  10/15/13 0549  PROT 5.3*  ALBUMIN 3.0*  AST 34  ALT 20  ALKPHOS 97  BILITOT 2.0*    PT/INR  Recent Labs  10/14/13 1930 10/15/13 1105  LABPROT 35.8* 24.5*  INR 3.77* 2.29*   Hepatitis Panel No results found for this basename: HEPBSAG, HCVAB, HEPAIGM, HEPBIGM,  in the last 72 hours  Studies/Results: Dg Chest Port 1 View  10/14/2013   CLINICAL DATA:  Shortness of breath.  EXAM: PORTABLE CHEST - 1 VIEW  COMPARISON:  CT chest 10/09/2013 and plain film of the chest 09/21/2013.  FINDINGS: AICD is again seen. There is marked cardiomegaly but no edema. Lungs clear. No pneumothorax or pleural fluid.  IMPRESSION: Heart cardiomegaly without acute disease.   Electronically Signed   By: Inge Rise M.D.   On: 10/14/2013 19:40    Assessment; Patient is a 80 year old Caucasian male with multiple medical problems including known ischemic cardiomyopathy with EF of 15% and atrial fibrillation was anticoagulated and now presents with one-day history of melena. He does not take OTC NSAIDs. There is no history of peptic ulcer disease or prior GI bleed. He had been on warfarin and was switched to Xarelto because he got tired of being stuck every month. Suspect he is bleeding from upper GI tract or small bowel. He has history of sigmoid colon diverticulosis but I doubt that this is diverticular bleed. Differential diagnoses include peptic ulcer disease or GI bleed secondary to angiodysplasia. Patient's BNP is markedly elevated but he does not appear to be in acute CHF.   Recommendations; INR today(done). Diagnostic EGD later today. If EGD is negative he will need Given capsule study.   LOS: 1 day   Maudie Shingledecker U Cherylin Waguespack  10/15/2013, 11:50 AM

## 2013-10-15 NOTE — Care Management Utilization Note (Signed)
UR completed 

## 2013-10-15 NOTE — Progress Notes (Signed)
Subjective: Mario Cline was admitted yesterday after experiencing melena at home. Has had some nausea but no vomiting. He denies abdominal pain. Is anticoagulated with Subroto. His hemoglobin is dropped overnight from 12.5-10.8 his INR was 3.77 he said 2 stools which were positive for blood. His hemoglobin has dropped from 12.5-10.8. He has had low blood pressures as he has had chronically. Diuretics have been held. His weight at home yesterday was 161 which is on target. His renal insufficiency is worse possibly related to blood in his GI tract as well as his current volume status. He was hyperkalemic and required treatment with Kayexalate. Potassium is normal today.  Objective: Vital signs in last 24 hours: Filed Vitals:   10/14/13 2300 10/15/13 0000 10/15/13 0400 10/15/13 0449  BP: 88/62 81/53    Pulse:  137    Temp:   97.6 F (36.4 C)   TempSrc:   Oral   Resp: 14 17    Height:    5\' 8"  (1.727 m)  Weight:    166 lb 10.7 oz (75.6 kg)  SpO2:  98%     Weight change:   Intake/Output Summary (Last 24 hours) at 10/15/13 0737 Last data filed at 10/15/13 0300  Gross per 24 hour  Intake      0 ml  Output    850 ml  Net   -850 ml    Physical Exam: Alert and comfortable. Lungs clear. Heart irregularly irregular. Abdomen soft and nontender. Extremities reveal no significant edema.  Lab Results:    Results for orders placed during the hospital encounter of 10/14/13 (from the past 24 hour(s))  COMPREHENSIVE METABOLIC PANEL     Status: Abnormal   Collection Time    10/14/13  7:30 PM      Result Value Ref Range   Sodium 135 (*) 137 - 147 mEq/L   Potassium 5.9 (*) 3.7 - 5.3 mEq/L   Chloride 99  96 - 112 mEq/L   CO2 20  19 - 32 mEq/L   Glucose, Bld 109 (*) 70 - 99 mg/dL   BUN 98 (*) 6 - 23 mg/dL   Creatinine, Ser 3.13 (*) 0.50 - 1.35 mg/dL   Calcium 9.3  8.4 - 10.5 mg/dL   Total Protein 6.2  6.0 - 8.3 g/dL   Albumin 3.5  3.5 - 5.2 g/dL   AST 42 (*) 0 - 37 U/L   ALT 25  0 - 53 U/L   Alkaline Phosphatase 116  39 - 117 U/L   Total Bilirubin 2.0 (*) 0.3 - 1.2 mg/dL   GFR calc non Af Amer 17 (*) >90 mL/min   GFR calc Af Amer 20 (*) >90 mL/min  PRO B NATRIURETIC PEPTIDE     Status: Abnormal   Collection Time    10/14/13  7:30 PM      Result Value Ref Range   Pro B Natriuretic peptide (BNP) 16323.0 (*) 0 - 450 pg/mL  LIPASE, BLOOD     Status: Abnormal   Collection Time    10/14/13  7:30 PM      Result Value Ref Range   Lipase 66 (*) 11 - 59 U/L  CBC WITH DIFFERENTIAL     Status: Abnormal   Collection Time    10/14/13  7:30 PM      Result Value Ref Range   WBC 6.1  4.0 - 10.5 K/uL   RBC 3.49 (*) 4.22 - 5.81 MIL/uL   Hemoglobin 12.5 (*) 13.0 - 17.0 g/dL  HCT 36.9 (*) 39.0 - 52.0 %   MCV 105.7 (*) 78.0 - 100.0 fL   MCH 35.8 (*) 26.0 - 34.0 pg   MCHC 33.9  30.0 - 36.0 g/dL   RDW 16.6 (*) 11.5 - 15.5 %   Platelets 124 (*) 150 - 400 K/uL   Neutrophils Relative % 68  43 - 77 %   Neutro Abs 4.2  1.7 - 7.7 K/uL   Lymphocytes Relative 21  12 - 46 %   Lymphs Abs 1.3  0.7 - 4.0 K/uL   Monocytes Relative 10  3 - 12 %   Monocytes Absolute 0.6  0.1 - 1.0 K/uL   Eosinophils Relative 1  0 - 5 %   Eosinophils Absolute 0.0  0.0 - 0.7 K/uL   Basophils Relative 0  0 - 1 %   Basophils Absolute 0.0  0.0 - 0.1 K/uL  PROTIME-INR     Status: Abnormal   Collection Time    10/14/13  7:30 PM      Result Value Ref Range   Prothrombin Time 35.8 (*) 11.6 - 15.2 seconds   INR 3.77 (*) 0.00 - 1.49  TYPE AND SCREEN     Status: None   Collection Time    10/14/13  7:30 PM      Result Value Ref Range   ABO/RH(D) O POS     Antibody Screen NEG     Sample Expiration 10/17/2013    POC OCCULT BLOOD, ED     Status: Abnormal   Collection Time    10/14/13  9:34 PM      Result Value Ref Range   Fecal Occult Bld POSITIVE (*) NEGATIVE  MRSA PCR SCREENING     Status: None   Collection Time    10/14/13 11:36 PM      Result Value Ref Range   MRSA by PCR NEGATIVE  NEGATIVE  TROPONIN I      Status: None   Collection Time    10/15/13  1:16 AM      Result Value Ref Range   Troponin I <0.30  <0.30 ng/mL  OCCULT BLOOD X 1 CARD TO LAB, STOOL     Status: Abnormal   Collection Time    10/15/13  1:56 AM      Result Value Ref Range   Fecal Occult Bld POSITIVE (*) NEGATIVE  TROPONIN I     Status: None   Collection Time    10/15/13  5:49 AM      Result Value Ref Range   Troponin I <0.30  <0.30 ng/mL  CBC     Status: Abnormal   Collection Time    10/15/13  5:49 AM      Result Value Ref Range   WBC 4.4  4.0 - 10.5 K/uL   RBC 3.04 (*) 4.22 - 5.81 MIL/uL   Hemoglobin 10.8 (*) 13.0 - 17.0 g/dL   HCT 32.1 (*) 39.0 - 52.0 %   MCV 105.6 (*) 78.0 - 100.0 fL   MCH 35.5 (*) 26.0 - 34.0 pg   MCHC 33.6  30.0 - 36.0 g/dL   RDW 16.7 (*) 11.5 - 15.5 %   Platelets 111 (*) 150 - 400 K/uL  COMPREHENSIVE METABOLIC PANEL     Status: Abnormal   Collection Time    10/15/13  5:49 AM      Result Value Ref Range   Sodium 143  137 - 147 mEq/L   Potassium 4.1  3.7 - 5.3  mEq/L   Chloride 105  96 - 112 mEq/L   CO2 23  19 - 32 mEq/L   Glucose, Bld 99  70 - 99 mg/dL   BUN 94 (*) 6 - 23 mg/dL   Creatinine, Ser 2.98 (*) 0.50 - 1.35 mg/dL   Calcium 8.7  8.4 - 10.5 mg/dL   Total Protein 5.3 (*) 6.0 - 8.3 g/dL   Albumin 3.0 (*) 3.5 - 5.2 g/dL   AST 34  0 - 37 U/L   ALT 20  0 - 53 U/L   Alkaline Phosphatase 97  39 - 117 U/L   Total Bilirubin 2.0 (*) 0.3 - 1.2 mg/dL   GFR calc non Af Amer 18 (*) >90 mL/min   GFR calc Af Amer 21 (*) >90 mL/min     ABGS No results found for this basename: PHART, PCO2, PO2ART, TCO2, HCO3,  in the last 72 hours CULTURES Recent Results (from the past 240 hour(s))  MRSA PCR SCREENING     Status: None   Collection Time    10/14/13 11:36 PM      Result Value Ref Range Status   MRSA by PCR NEGATIVE  NEGATIVE Final   Comment:            The GeneXpert MRSA Assay (FDA     approved for NASAL specimens     only), is one component of a     comprehensive MRSA  colonization     surveillance program. It is not     intended to diagnose MRSA     infection nor to guide or     monitor treatment for     MRSA infections.   Studies/Results: Dg Chest Port 1 View  10/14/2013   CLINICAL DATA:  Shortness of breath.  EXAM: PORTABLE CHEST - 1 VIEW  COMPARISON:  CT chest 10/09/2013 and plain film of the chest 09/21/2013.  FINDINGS: AICD is again seen. There is marked cardiomegaly but no edema. Lungs clear. No pneumothorax or pleural fluid.  IMPRESSION: Heart cardiomegaly without acute disease.   Electronically Signed   By: Inge Rise M.D.   On: 10/14/2013 19:40   Micro Results: Recent Results (from the past 240 hour(s))  MRSA PCR SCREENING     Status: None   Collection Time    10/14/13 11:36 PM      Result Value Ref Range Status   MRSA by PCR NEGATIVE  NEGATIVE Final   Comment:            The GeneXpert MRSA Assay (FDA     approved for NASAL specimens     only), is one component of a     comprehensive MRSA colonization     surveillance program. It is not     intended to diagnose MRSA     infection nor to guide or     monitor treatment for     MRSA infections.   Studies/Results: Dg Chest Port 1 View  10/14/2013   CLINICAL DATA:  Shortness of breath.  EXAM: PORTABLE CHEST - 1 VIEW  COMPARISON:  CT chest 10/09/2013 and plain film of the chest 09/21/2013.  FINDINGS: AICD is again seen. There is marked cardiomegaly but no edema. Lungs clear. No pneumothorax or pleural fluid.  IMPRESSION: Heart cardiomegaly without acute disease.   Electronically Signed   By: Inge Rise M.D.   On: 10/14/2013 19:40   Medications:  I have reviewed the patient's current medications Scheduled Meds: . allopurinol  100  mg Oral BID  . carvedilol  3.125 mg Oral BID WC  . docusate sodium  100 mg Oral BID  . fluticasone  2 spray Each Nare Daily  . folic acid  1 mg Oral Daily  . multivitamin with minerals  1 tablet Oral Daily  . pantoprazole (PROTONIX) IV  40 mg  Intravenous Q12H  . sodium chloride  3 mL Intravenous Q12H  . thiamine  100 mg Oral Daily   Continuous Infusions: . sodium chloride 50 mL/hr at 10/15/13 0001   PRN Meds:.acetaminophen, acetaminophen, alum & mag hydroxide-simeth, bisacodyl, HYDROcodone-acetaminophen, ondansetron (ZOFRAN) IV, ondansetron   Assessment/Plan: #1. Upper GI bleed. Treat with Protonix. Hold anti-coagulation. Consult gastroenterology. #2. Nonischemic cardiomyopathy/chronic systolic heart failure. Consult cardiology regarding his anti-coagulation and current volume status. #3. Hyperkalemia. Result after Kayexalate. #4. Chronic kidney disease stage IV. #5. Recent persistent cough. Repeat chest x-ray is again negative. Principal Problem:   GI bleed Active Problems:   Atrial fibrillation   Chronic anticoagulation   Cardiomyopathy   Hypotension     LOS: 1 day   Asencion Noble 10/15/2013, 7:37 AM

## 2013-10-15 NOTE — Op Note (Signed)
EGD PROCEDURE REPORT  PATIENT:  Mario Cline  MR#:  [id number] Birthdate:  [birth year], 80 y.o., male Endoscopist:  Dr. Rogene Houston, MD Referred By:  Dr. Asencion Noble, MD  Procedure Date: 10/15/2013  Procedure:   EGD  Indications:  Patient is an 80 year old Caucasian male with multiple medical problems including severe nonischemic cardiomyopathy and atrial fibrillation on anticoagulant who now presents with melena and drop in H&H.            Informed Consent:  The risks, benefits, alternatives & imponderables which include, but are not limited to, bleeding, infection, perforation, drug reaction and potential missed lesion have been reviewed.  The potential for biopsy, lesion removal, esophageal dilation, etc. have also been discussed.  Questions have been answered.  All parties agreeable.  Please see history & physical in medical record for more information.  Medications:   Versed 1 mg IV Cetacaine spray topically for oropharyngeal anesthesia  Description of procedure:  The endoscope was introduced through the mouth and advanced to the second portion of the duodenum without difficulty or limitations. The mucosal surfaces were surveyed very carefully during advancement of the scope and upon withdrawal.  Findings:  Esophagus:  Mucosa of the esophagus was normal. No varices are identified. The GE junction was wavy but no erosions noted. GEJ:  44 cm Stomach:  Stomach was empty and distended very well with insufflation. Folds in the proximal stomach are normal. Examination mucosa gastric body reveals mosaic pattern with few red spots. No bleeding noted. Antral mucosa was unremarkable. Pyloric channel was patent. Angularis, fundus and cardia were also examined and no bleeding lesion identified. Duodenum:  Two small bulbar erosions noted without stigmata of bleed.  Therapeutic/Diagnostic Maneuvers Performed:  None  Complications:  None  Impression: Mild portal gastropathy without evidence of  esophageal or gastric varices. Bulbar erosions without stigmata of bleeding.  Comment; No bleeding lesion identified on this exam. Portal gastropathy may be secondary to chronic CHF; he does not have stigmata of chronic liver disease. Liver contour on recent chest CT does not show the changes of cirrhosis.  Recommendations:  H&H this evening. H. pylori serology and INR in a.m. Would consider small bowel Given capsule study if cleared by cardiologist(patient has AICD/dual-chamber pacemaker).   Rogene Houston  10/15/2013  5:56 PM  CC: Dr. Asencion Noble, MD & Dr. Rayne Du ref. provider found

## 2013-10-16 ENCOUNTER — Encounter (HOSPITAL_COMMUNITY): Payer: Self-pay | Admitting: *Deleted

## 2013-10-16 ENCOUNTER — Encounter (HOSPITAL_COMMUNITY)
Admission: EM | Disposition: A | Discharge status: Home / Self Care | Payer: Self-pay | Source: Home / Self Care | Attending: Internal Medicine

## 2013-10-16 ENCOUNTER — Encounter: Payer: Self-pay | Admitting: Cardiology

## 2013-10-16 DIAGNOSIS — K6389 Other specified diseases of intestine: Secondary | ICD-10-CM

## 2013-10-16 DIAGNOSIS — I5022 Chronic systolic (congestive) heart failure: Secondary | ICD-10-CM

## 2013-10-16 HISTORY — PX: GIVENS CAPSULE STUDY: SHX5432

## 2013-10-16 LAB — BASIC METABOLIC PANEL
BUN: 73 mg/dL — ABNORMAL HIGH (ref 6–23)
CHLORIDE: 108 meq/L (ref 96–112)
CO2: 24 meq/L (ref 19–32)
Calcium: 8.1 mg/dL — ABNORMAL LOW (ref 8.4–10.5)
Creatinine, Ser: 2.23 mg/dL — ABNORMAL HIGH (ref 0.50–1.35)
GFR calc non Af Amer: 26 mL/min — ABNORMAL LOW (ref >90)
GFR, EST AFRICAN AMERICAN: 30 mL/min — AB (ref >90)
Glucose, Bld: 86 mg/dL (ref 70–99)
Potassium: 3.1 mEq/L — ABNORMAL LOW (ref 3.7–5.3)
SODIUM: 145 meq/L (ref 137–147)

## 2013-10-16 LAB — CBC
HCT: 33.8 % — ABNORMAL LOW (ref 39.0–52.0)
Hemoglobin: 11.6 g/dL — ABNORMAL LOW (ref 13.0–17.0)
MCH: 36.4 pg — ABNORMAL HIGH (ref 26.0–34.0)
MCHC: 34.3 g/dL (ref 30.0–36.0)
MCV: 106 fL — ABNORMAL HIGH (ref 78.0–100.0)
Platelets: 100 10*3/uL — ABNORMAL LOW (ref 150–400)
RBC: 3.19 MIL/uL — AB (ref 4.22–5.81)
RDW: 16.7 % — AB (ref 11.5–15.5)
WBC: 4.3 10*3/uL (ref 4.0–10.5)

## 2013-10-16 LAB — GLUCOSE, CAPILLARY: Glucose-Capillary: 79 mg/dL (ref 70–99)

## 2013-10-16 LAB — H. PYLORI ANTIBODY, IGG: H Pylori IgG: <0.4 {ISR}

## 2013-10-16 SURGERY — IMAGING PROCEDURE, GI TRACT, INTRALUMINAL, VIA CAPSULE

## 2013-10-16 MED ORDER — POTASSIUM CHLORIDE CRYS ER 20 MEQ PO TBCR
20.0000 meq | EXTENDED_RELEASE_TABLET | Freq: Three times a day (TID) | ORAL | Status: DC
  Administered 2013-10-16 – 2013-10-17 (×4): 20 meq via ORAL
  Filled 2013-10-16 (×4): qty 1

## 2013-10-16 MED ORDER — METOCLOPRAMIDE HCL 5 MG/ML IJ SOLN
10.0000 mg | Freq: Once | INTRAMUSCULAR | Status: AC
  Administered 2013-10-16: 10 mg via INTRAVENOUS
  Filled 2013-10-16: qty 2

## 2013-10-16 MED ORDER — TORSEMIDE 20 MG PO TABS
40.0000 mg | ORAL_TABLET | Freq: Two times a day (BID) | ORAL | Status: DC
  Administered 2013-10-16 – 2013-10-17 (×3): 40 mg via ORAL
  Filled 2013-10-16 (×3): qty 2

## 2013-10-16 NOTE — Op Note (Signed)
Small Bowel [place]ivens Capsule Study Procedure date:  10/16/2013.  Referring Provider:  Dr. Asencion Noble, MD PCP:  Dr. Asencion Noble, MD  Indication for procedure:  Patient is an 80 year old Caucasian male with multiple medical problems who is on anticoagulants for atrial fibrillation who presents with melena and drop in H&H. He underwent EGD yesterday and no bleeding lesion was identified. Source of bleeding possibly from small bowel and he is therefore undergoing small bowel given capsule study. Study is indicated because he needs to go back on anticoagulant.  Patient data:   Findings:   Patient was able to swallow given capsule without any difficulty. Duration of study is 8 hours. Given capsule did not make it to cecum. Changes of portal gastropathy noted as on EGD. Edema noted to jejunal mucosal folds but no bleeding lesion identified in small bowel segments that were examined.  First Gastric image:  2 min and 45 sec. First Duodenal image: 18 min and 25 sec. First Ileo-Cecal Valve image: NA. First Cecal image: Given capsule did not reach cecum. Gastric Passage time: 16 min Small Bowel Passage time:  Not be calculated as capsule did not reach cecum.  Summary & Recommendations: Study is incomplete because Given capsule did not reach cecum although I believe most of the small bowel was examined and no bleeding lesion identified. Colonoscopy would be reasonable I doubt that he will be able to tolerate prep. However if he bleeds again will consider further evaluation. If he has evidence of bleed while hospitalized will consider GI bleeding scan. Would recommend to hold anticoagulant another 48 hours.

## 2013-10-16 NOTE — Progress Notes (Signed)
Pharmacist Heart Failure Core Measure Documentation  Assessment: Mario Cline has an EF documented as 15% on 08/02/13 by echo.  Rationale: Heart failure patients with left ventricular systolic dysfunction (LVSD) and an EF < 40% should be prescribed an angiotensin converting enzyme inhibitor (ACEI) or angiotensin receptor blocker (ARB) at discharge unless a contraindication is documented in the medical record.  This patient is not currently on an ACEI or ARB for HF.  This note is being placed in the record in order to provide documentation that a contraindication to the use of these agents is present for this encounter.  ACE Inhibitor or Angiotensin Receptor Blocker is contraindicated (specify all that apply)  []   ACEI allergy AND ARB allergy []   Angioedema []   Moderate or severe aortic stenosis []   Hyperkalemia []   Hypotension []   Renal artery stenosis [x]   Worsening renal function, preexisting renal disease or dysfunction   Ena Dawley 10/16/2013 1:31 PM

## 2013-10-16 NOTE — Progress Notes (Signed)
Subjective: Mario Cline is comfortable this morning. He denies any bleeding overnight. He underwent EGD yesterday afternoon revealing bulbar erosions and portal gastropathy but no sign of a bleeding lesion to explain his melena.  Objective: Vital signs in last 24 hours: Filed Vitals:   10/16/13 0300 10/16/13 0400 10/16/13 0500 10/16/13 0600  BP: 89/55 68/53 75/47    Pulse: 108 84  89  Temp:   98.1 F (36.7 C)   TempSrc:   Oral   Resp: 20 19 22 20   Height:      Weight:   165 lb 2 oz (74.9 kg)   SpO2: 93% 97% 96% 90%   Weight change: 5 lb 2 oz (2.324 kg)  Intake/Output Summary (Last 24 hours) at 10/16/13 0750 Last data filed at 10/16/13 9562  Gross per 24 hour  Intake 1581.83 ml  Output   2150 ml  Net -568.17 ml    Physical Exam: No distress. Lungs clear. Heart irregularly irregular with a grade 2 systolic murmur. Abdomen is soft and nontender with no palpable organomegaly. Extremities revealed no edema.  Lab Results:    Results for orders placed during the hospital encounter of 10/14/13 (from the past 24 hour(s))  TROPONIN I     Status: None   Collection Time    10/15/13 11:05 AM      Result Value Ref Range   Troponin I <0.30  <0.30 ng/mL  PROTIME-INR     Status: Abnormal   Collection Time    10/15/13 11:05 AM      Result Value Ref Range   Prothrombin Time 24.5 (*) 11.6 - 15.2 seconds   INR 2.29 (*) 0.00 - 1.49  HEMOGLOBIN AND HEMATOCRIT, BLOOD     Status: Abnormal   Collection Time    10/15/13  6:28 PM      Result Value Ref Range   Hemoglobin 11.7 (*) 13.0 - 17.0 g/dL   HCT 34.2 (*) 39.0 - 52.0 %  CBC     Status: Abnormal   Collection Time    10/16/13  5:42 AM      Result Value Ref Range   WBC 4.3  4.0 - 10.5 K/uL   RBC 3.19 (*) 4.22 - 5.81 MIL/uL   Hemoglobin 11.6 (*) 13.0 - 17.0 g/dL   HCT 33.8 (*) 39.0 - 52.0 %   MCV 106.0 (*) 78.0 - 100.0 fL   MCH 36.4 (*) 26.0 - 34.0 pg   MCHC 34.3  30.0 - 36.0 g/dL   RDW 16.7 (*) 11.5 - 15.5 %   Platelets 100 (*) 150  - 400 K/uL  BASIC METABOLIC PANEL     Status: Abnormal   Collection Time    10/16/13  5:42 AM      Result Value Ref Range   Sodium 145  137 - 147 mEq/L   Potassium 3.1 (*) 3.7 - 5.3 mEq/L   Chloride 108  96 - 112 mEq/L   CO2 24  19 - 32 mEq/L   Glucose, Bld 86  70 - 99 mg/dL   BUN 73 (*) 6 - 23 mg/dL   Creatinine, Ser 2.23 (*) 0.50 - 1.35 mg/dL   Calcium 8.1 (*) 8.4 - 10.5 mg/dL   GFR calc non Af Amer 26 (*) >90 mL/min   GFR calc Af Amer 30 (*) >90 mL/min  GLUCOSE, CAPILLARY     Status: None   Collection Time    10/16/13  6:09 AM      Result Value Ref Range  Glucose-Capillary 79  70 - 99 mg/dL   Comment 1 Notify RN       ABGS No results found for this basename: PHART, PCO2, PO2ART, TCO2, HCO3,  in the last 72 hours CULTURES Recent Results (from the past 240 hour(s))  MRSA PCR SCREENING     Status: None   Collection Time    10/14/13 11:36 PM      Result Value Ref Range Status   MRSA by PCR NEGATIVE  NEGATIVE Final   Comment:            The GeneXpert MRSA Assay (FDA     approved for NASAL specimens     only), is one component of a     comprehensive MRSA colonization     surveillance program. It is not     intended to diagnose MRSA     infection nor to guide or     monitor treatment for     MRSA infections.   Studies/Results: Dg Chest Port 1 View  10/14/2013   CLINICAL DATA:  Shortness of breath.  EXAM: PORTABLE CHEST - 1 VIEW  COMPARISON:  CT chest 10/09/2013 and plain film of the chest 09/21/2013.  FINDINGS: AICD is again seen. There is marked cardiomegaly but no edema. Lungs clear. No pneumothorax or pleural fluid.  IMPRESSION: Heart cardiomegaly without acute disease.   Electronically Signed   By: Inge Rise M.D.   On: 10/14/2013 19:40   Micro Results: Recent Results (from the past 240 hour(s))  MRSA PCR SCREENING     Status: None   Collection Time    10/14/13 11:36 PM      Result Value Ref Range Status   MRSA by PCR NEGATIVE  NEGATIVE Final   Comment:             The GeneXpert MRSA Assay (FDA     approved for NASAL specimens     only), is one component of a     comprehensive MRSA colonization     surveillance program. It is not     intended to diagnose MRSA     infection nor to guide or     monitor treatment for     MRSA infections.   Studies/Results: Dg Chest Port 1 View  10/14/2013   CLINICAL DATA:  Shortness of breath.  EXAM: PORTABLE CHEST - 1 VIEW  COMPARISON:  CT chest 10/09/2013 and plain film of the chest 09/21/2013.  FINDINGS: AICD is again seen. There is marked cardiomegaly but no edema. Lungs clear. No pneumothorax or pleural fluid.  IMPRESSION: Heart cardiomegaly without acute disease.   Electronically Signed   By: Inge Rise M.D.   On: 10/14/2013 19:40   Medications:  I have reviewed the patient's current medications Scheduled Meds: . allopurinol  100 mg Oral BID  . carvedilol  3.125 mg Oral BID WC  . docusate sodium  100 mg Oral BID  . fluticasone  2 spray Each Nare Daily  . folic acid  1 mg Oral Daily  . multivitamin with minerals  1 tablet Oral Daily  . pantoprazole (PROTONIX) IV  40 mg Intravenous Q12H  . potassium chloride  20 mEq Oral TID  . sodium chloride  3 mL Intravenous Q12H  . torsemide  40 mg Oral BID   Continuous Infusions: . sodium chloride 10 mL/hr at 10/16/13 0600   PRN Meds:.acetaminophen, acetaminophen, alum & mag hydroxide-simeth, bisacodyl, HYDROcodone-acetaminophen, ondansetron (ZOFRAN) IV, ondansetron   Assessment/Plan: #1. Upper GI bleed. Will  discuss capsule study with cardiology and gastroenterology today. Continue Protonix. Hemoglobin is 11.6. Blood pressures remained stable with systolics typically in the 80s. #2. Nonischemic cardiomyopathy. Restart torsemide today. #3. Hypokalemia. Potassium has dropped to 3.1. Supplement orally. #4. Chronic renal failure BUN and creatinine have improved. #5. Atrial fibrillation. Continue holding anticoagulation. #6. CODE STATUS. Discussed with  family yesterday. There wishes are for medications and shocks if necessary but not CPR or intubation. Principal Problem:   GI bleed Active Problems:   Atrial fibrillation   Chronic anticoagulation   Cardiomyopathy   Hypotension     LOS: 2 days   Asencion Noble 10/16/2013, 7:50 AM

## 2013-10-16 NOTE — Progress Notes (Signed)
Subjective; Patient has no complaints other than dry hacking cough. He denies chest pain shortness of breath or abdominal pain. No melena reported since last seen.  Objective; BP 89/58  Pulse 100  Temp(Src) 98.4 F (36.9 C) (Axillary)  Resp 11  Ht 5\' 8"  (1.727 m)  Wt 165 lb (74.844 kg)  BMI 25.09 kg/m2  SpO2 98% Abdomen is full but soft and nontender without organomegaly or masses. Ankle edema has resolved.  Lab data; H&H is 11.6 and 33.8 and platelet count 100K. Serum potassium 3.1, BUN 73 and creatinine 2.23.   Assessment; #1. GI bleed and anemia. No source found on EGD yesterday.  With history of melena and negative EGD,next likely source would be small bowel. No evidence of active bleeding. His H&H has come up some. Discussed with Dr. Domenic Polite. #2. Mild thrombocytopenia. Etiology unclear. #3. Nonischemic cardiomyopathy; very low EF of 15%. Cardiology is managing. #4. Chronic kidney disease. Renal function improving. BUN out of proportion to serum creatinine possibly due to blood in GI tract. #5. Mild hyperbilirubinemia most likely secondary to hepatic congestion.  Recommendations; Will proceed with Given capsule study while patient is being monitored. Single dose of metoclopramide 10 mg IV.

## 2013-10-16 NOTE — Progress Notes (Signed)
Primary cardiologist: Dr. Kate Sable Consulting cardiologist: Dr. Satira Sark  Subjective:   Feels hungry this morning, otherwise no chest pain or shortness of breath at rest.   Objective:   Temp:  [97.8 F (36.6 C)-98.3 F (36.8 C)] 98.1 F (36.7 C) (06/09 0500) Pulse Rate:  [57-189] 89 (06/09 0600) Resp:  [11-24] 20 (06/09 0600) BP: (68-98)/(47-63) 75/47 mmHg (06/09 0500) SpO2:  [90 %-100 %] 90 % (06/09 0600) Weight:  [165 lb 2 oz (74.9 kg)] 165 lb 2 oz (74.9 kg) (06/09 0500) Last BM Date: 10/15/13  Filed Weights   10/14/13 1858 10/15/13 0449 10/16/13 0500  Weight: 160 lb (72.576 kg) 166 lb 10.7 oz (75.6 kg) 165 lb 2 oz (74.9 kg)    Intake/Output Summary (Last 24 hours) at 10/16/13 0809 Last data filed at 10/16/13 0277  Gross per 24 hour  Intake 1581.83 ml  Output   1800 ml  Net -218.17 ml    Telemetry: Atrial fibrillation with PVCs.  Exam:  General: No distress.  Lungs: Decreased breath sounds, nonlabored.  Cardiac: Irregularly irregular, no gallop.  Extremities: Trace edema.   Lab Results:  Basic Metabolic Panel:  Recent Labs Lab 10/14/13 1930 10/15/13 0549 10/16/13 0542  NA 135* 143 145  K 5.9* 4.1 3.1*  CL 99 105 108  CO2 20 23 24   GLUCOSE 109* 99 86  BUN 98* 94* 73*  CREATININE 3.13* 2.98* 2.23*  CALCIUM 9.3 8.7 8.1*    Liver Function Tests:  Recent Labs Lab 10/14/13 1930 10/15/13 0549  AST 42* 34  ALT 25 20  ALKPHOS 116 97  BILITOT 2.0* 2.0*  PROT 6.2 5.3*  ALBUMIN 3.5 3.0*    CBC:  Recent Labs Lab 10/14/13 1930 10/15/13 0549 10/15/13 1828 10/16/13 0542  WBC 6.1 4.4  --  4.3  HGB 12.5* 10.8* 11.7* 11.6*  HCT 36.9* 32.1* 34.2* 33.8*  MCV 105.7* 105.6*  --  106.0*  PLT 124* 111*  --  100*    Cardiac Enzymes:  Recent Labs Lab 10/15/13 0116 10/15/13 0549 10/15/13 1105  TROPONINI <0.30 <0.30 <0.30    Coagulation:  Recent Labs Lab 10/14/13 1930 10/15/13 1105  INR 3.77* 2.29*      Medications:   Scheduled Medications: . allopurinol  100 mg Oral BID  . carvedilol  3.125 mg Oral BID WC  . docusate sodium  100 mg Oral BID  . fluticasone  2 spray Each Nare Daily  . folic acid  1 mg Oral Daily  . multivitamin with minerals  1 tablet Oral Daily  . pantoprazole (PROTONIX) IV  40 mg Intravenous Q12H  . potassium chloride  20 mEq Oral TID  . sodium chloride  3 mL Intravenous Q12H  . torsemide  40 mg Oral BID     Infusions: . sodium chloride 10 mL/hr at 10/16/13 0600     PRN Medications:  acetaminophen, acetaminophen, alum & mag hydroxide-simeth, bisacodyl, HYDROcodone-acetaminophen, ondansetron (ZOFRAN) IV, ondansetron   Assessment:   1.  GI bleed with blood loss anemia, hemoglobin has come back up from 10.8 to 11.7 without specific intervention. Patient underwent endoscopy yesterday with findings of mild portal gastropathy without varices, bulbar erosions without any active bleeding. Capsule study is planned today (on telemetry in light of device in place).  2. Permanent atrial fibrillation, heart rate is controlled on carvedilol, Xarelto remains on hold.  3. Nonischemic cardiomyopathy with LVEF approximately 15%, St. Jude ICD in place.  4. Hypotension.  5. Acute on  chronic renal failure, creatinine has improved to 2.3. Has CKD stage III at baseline.   Plan/Discussion:    Continue to hold Xarelto pending capsule study results. He has been placed back on diuretic regimen, continues on Coreg. No ACE inhibitor with low blood pressure and renal insufficiency. We continue to follow.   Satira Sark, M.D., F.A.C.C.

## 2013-10-17 LAB — BASIC METABOLIC PANEL
BUN: 62 mg/dL — AB (ref 6–23)
CALCIUM: 8.6 mg/dL (ref 8.4–10.5)
CO2: 26 mEq/L (ref 19–32)
CREATININE: 1.98 mg/dL — AB (ref 0.50–1.35)
Chloride: 102 mEq/L (ref 96–112)
GFR calc Af Amer: 34 mL/min — ABNORMAL LOW (ref >90)
GFR calc non Af Amer: 30 mL/min — ABNORMAL LOW (ref >90)
Glucose, Bld: 113 mg/dL — ABNORMAL HIGH (ref 70–99)
Potassium: 3.9 mEq/L (ref 3.7–5.3)
Sodium: 141 mEq/L (ref 137–147)

## 2013-10-17 LAB — GLUCOSE, CAPILLARY: Glucose-Capillary: 102 mg/dL — ABNORMAL HIGH (ref 70–99)

## 2013-10-17 MED ORDER — POTASSIUM CHLORIDE CRYS ER 20 MEQ PO TBCR: 20.0000 meq | EXTENDED_RELEASE_TABLET | Freq: Three times a day (TID) | ORAL | Status: DC

## 2013-10-17 NOTE — Discharge Summary (Signed)
Physician Discharge Summary  Mario Cline QMV:[id number] DOB: 1928/11/20 DOA: 10/14/2013   Admit date: 10/14/2013 Discharge date: 10/17/2013  Discharge Diagnoses:  Principal Problem:   GI bleed Active Problems:   Atrial fibrillation   Chronic anticoagulation   Cardiomyopathy   Hypotension    Wt Readings from Last 3 Encounters:  10/17/13 167 lb 12.3 oz (76.1 kg)  10/17/13 167 lb 12.3 oz (76.1 kg)  10/17/13 167 lb 12.3 oz (76.1 kg)     Hospital Course:  This patient is an 80 year old male who presented with melena. There was no abdominal pain. Blood pressure is chronically in the 80 range systolic. He was treated with IV fluids initially but because of his cardiomyopathy fluids were discontinued. His hemoglobin dropped from 12.52 10.8. He was treated empirically with intravenous Protonix. He was seen in gastroenterology consultation by Dr. Romie Minus on. He underwent an upper endoscopy which revealed portal gastropathy and bulbar erosions but no ulcers or signs of malignancy. He underwent a given some study which revealed no bleeding source however the capsule did not make it to the cecum. Discussion was held regarding further evaluation with colonoscopy. This will not be performed at the present time but may be utilized in the future if needed. His last colonoscopy was 5 years ago. If he bleeds again he will have a GI bleeding scan. Anti-coagulation with his R. elbow has been held. BUN and creatinine were elevated due to blood in the intestinal tract and improved during his hospitalization back to approximately his baseline. His cardiovascular status has remained stable.  Condition at discharge is much improved. He is eating without difficulty and has no sign of bleeding. He will be seen in followup in mild [place] in one week.  He also had hyperkalemia initially. He had been on potassium 40 mEq 3 times a day. This is being decreased to 20 mEq 3 times a day. He developed hypokalemia after having  Kayexalate on admission. Potassium dropped to 3.1 but is normal now at 3.9.  The decision to restart anti-coagulation will be made at his followup cardiology visit in approximately one week.   Discharge Instructions     Medication List    STOP taking these medications       moxifloxacin 400 MG tablet  Commonly known as:  AVELOX     rivaroxaban 10 MG Tabs tablet  Commonly known as:  XARELTO      TAKE these medications       allopurinol 100 MG tablet  Commonly known as:  ZYLOPRIM  Take 100 mg by mouth 2 (two) times daily.     carvedilol 6.25 MG tablet  Commonly known as:  COREG  Take 3.125 mg by mouth 2 (two) times daily with a meal.     fluticasone 27.5 MCG/SPRAY nasal spray  Commonly known as:  VERAMYST  Place 2 sprays into the nose daily.     metolazone 2.5 MG tablet  Commonly known as:  ZAROXOLYN  Take 2.5 mg by mouth 3 (three) times a week. EVERY MON  Wed and FRI     potassium chloride SA 20 MEQ tablet  Commonly known as:  K-DUR,KLOR-CON  Take 1 tablet (20 mEq total) by mouth 3 (three) times daily.     torsemide 20 MG tablet  Commonly known as:  DEMADEX  Take 40 mg by mouth 2 (two) times daily.         Maxene Byington 10/17/2013

## 2013-10-17 NOTE — Progress Notes (Signed)
Primary cardiologist: Dr. Kate Sable  Consulting cardiologist: Dr. Satira Sark  Subjective:   No changes in stools, no palpitations or chest pain.   Objective:   Temp:  [97.4 F (36.3 C)-98.4 F (36.9 C)] 97.4 F (36.3 C) (06/10 0400) Pulse Rate:  [25-118] 68 (06/10 0400) Resp:  [11-33] 18 (06/10 0600) BP: (64-104)/(35-66) 104/57 mmHg (06/10 0600) SpO2:  [76 %-100 %] 94 % (06/10 0300) Weight:  [165 lb (74.844 kg)-167 lb 12.3 oz (76.1 kg)] 167 lb 12.3 oz (76.1 kg) (06/10 0500) Last BM Date: 10/16/13  Filed Weights   10/16/13 0500 10/16/13 0856 10/17/13 0500  Weight: 165 lb 2 oz (74.9 kg) 165 lb (74.844 kg) 167 lb 12.3 oz (76.1 kg)    Intake/Output Summary (Last 24 hours) at 10/17/13 0720 Last data filed at 10/17/13 0552  Gross per 24 hour  Intake      0 ml  Output   2050 ml  Net  -2050 ml    Telemetry: Atrial fibrillation with PVCs.  Exam:  General: Chronically ill-appearing, no distress.  Lungs: Decreased breath sounds, nonlabored.  Cardiac: Irregularly irregular, no gallop.  Extremities: No pitting.   Lab Results:  Basic Metabolic Panel:  Recent Labs Lab 10/15/13 0549 10/16/13 0542 10/17/13 0424  NA 143 145 141  K 4.1 3.1* 3.9  CL 105 108 102  CO2 23 24 26   GLUCOSE 99 86 113*  BUN 94* 73* 62*  CREATININE 2.98* 2.23* 1.98*  CALCIUM 8.7 8.1* 8.6    Liver Function Tests:  Recent Labs Lab 10/14/13 1930 10/15/13 0549  AST 42* 34  ALT 25 20  ALKPHOS 116 97  BILITOT 2.0* 2.0*  PROT 6.2 5.3*  ALBUMIN 3.5 3.0*    CBC:  Recent Labs Lab 10/14/13 1930 10/15/13 0549 10/15/13 1828 10/16/13 0542  WBC 6.1 4.4  --  4.3  HGB 12.5* 10.8* 11.7* 11.6*  HCT 36.9* 32.1* 34.2* 33.8*  MCV 105.7* 105.6*  --  106.0*  PLT 124* 111*  --  100*    Cardiac Enzymes:  Recent Labs Lab 10/15/13 0116 10/15/13 0549 10/15/13 1105  TROPONINI <0.30 <0.30 <0.30     Medications:   Scheduled Medications: . allopurinol  100 mg Oral BID   . carvedilol  3.125 mg Oral BID WC  . docusate sodium  100 mg Oral BID  . fluticasone  2 spray Each Nare Daily  . folic acid  1 mg Oral Daily  . multivitamin with minerals  1 tablet Oral Daily  . pantoprazole (PROTONIX) IV  40 mg Intravenous Q12H  . potassium chloride  20 mEq Oral TID  . sodium chloride  3 mL Intravenous Q12H  . torsemide  40 mg Oral BID     Infusions: . sodium chloride 10 mL/hr at 10/16/13 0600     PRN Medications:  acetaminophen, acetaminophen, alum & mag hydroxide-simeth, bisacodyl, HYDROcodone-acetaminophen, ondansetron (ZOFRAN) IV, ondansetron   Assessment:   1. GI bleed with blood loss anemia, hemoglobin has come back up from 10.8 to 11.6 without specific intervention. Patient underwent endoscopy with findings of mild portal gastropathy without varices, bulbar erosions without any active bleeding. Capsule study did not show any obvious bleeding lesions within the small bowel, did not reach the cecum. Dr. Laural Golden indicates holding anticoagulation at least another 48 hours, could consider a colonoscopy if he bleeds again.  2. Permanent atrial fibrillation, heart rate is controlled on carvedilol, Xarelto remains on hold.   3. Nonischemic cardiomyopathy with LVEF approximately  15%, St. Jude ICD in place.   4. Thrombocytopenia, platelets 100.  5. Acute on chronic renal failure, creatinine has improved to 2.3. Has CKD stage III at baseline.   Plan/Discussion:    Patient is being discharged home today as per Dr. Willey Blade. Will arrange a followup visit in a week or so, stay off anticoagulation for now. Question is whether to resume low dose Xarelto 10 mg daily, or perhaps even consider Eliquis 2.5 mg twice daily. Follow up with Dr. Bronson Ing or NP.   Satira Sark, M.D., F.A.C.C.

## 2013-10-17 NOTE — Progress Notes (Signed)
Subjective; Patient denies melena rectal bleeding or abdominal pain. He states he got up 4 or 5x2 void and was not able to sleep. Appetite is fair. He denies shortness of breath or chest pain.  Objective; BP 104/57  Pulse 68  Temp(Src) 97.4 F (36.3 C) (Oral)  Resp 18  Ht 5\' 8"  (1.727 m)  Wt 167 lb 12.3 oz (76.1 kg)  BMI 25.52 kg/m2  SpO2 94% Patient is alert and in no acute distress. Abdomen is full but soft and nontender without organomegaly or masses. He has small umbilical hernia which is completely reducible. Note LE edema noted   Lab data; H. pylori serology is negative. Serum potassium 3.9 and creatinine 1.98 Assessment; #1. GI bleed. No evidence of active or recurrent bleed. No source found on EGD and small bowel given capsule study. EGD did reveal portal gastropathy which may be related to chronic hepatic congestion or occult cardiac cirrhosis. #2. Mild thrombocytopenia. Etiology unclear. Once again may be a clue to underlying liver disease. #3. Nonischemic cardiomyopathy. He appears to be back to his baseline and is being followed by Dr. Domenic Polite. #4. Chronic kidney disease.  Recommendations; No further workup at this time. Patient will undergo reevaluation if he bleeds again. He will be seen at cardiology clinic in the future and anticoagulation will be re-instituted. Agree with plans for discharge.

## 2013-10-17 NOTE — Progress Notes (Signed)
Patient given discharge instructions. Patient verbalized understanding of all discharge instructions and follow up appointments. Patient alert, oriented and in stable condition at the time of discharge. Patient's SBP in the 80's, however, patient is non-symptomatic and MD is aware. Patient discharged home with wife.

## 2013-10-18 ENCOUNTER — Ambulatory Visit (INDEPENDENT_AMBULATORY_CARE_PROVIDER_SITE_OTHER): Payer: Medicare Other | Admitting: *Deleted

## 2013-10-18 ENCOUNTER — Encounter (HOSPITAL_COMMUNITY): Payer: Self-pay | Admitting: Internal Medicine

## 2013-10-18 DIAGNOSIS — I428 Other cardiomyopathies: Secondary | ICD-10-CM

## 2013-10-18 NOTE — Progress Notes (Signed)
Remote ICD transmission.   

## 2013-10-22 ENCOUNTER — Ambulatory Visit (INDEPENDENT_AMBULATORY_CARE_PROVIDER_SITE_OTHER): Payer: Medicare Other | Admitting: *Deleted

## 2013-10-22 DIAGNOSIS — I429 Cardiomyopathy, unspecified: Secondary | ICD-10-CM

## 2013-10-22 DIAGNOSIS — I428 Other cardiomyopathies: Secondary | ICD-10-CM

## 2013-10-23 NOTE — Progress Notes (Signed)
Remote ICD transmission.   

## 2013-10-24 ENCOUNTER — Ambulatory Visit (INDEPENDENT_AMBULATORY_CARE_PROVIDER_SITE_OTHER): Payer: Medicare Other | Admitting: Internal Medicine

## 2013-10-24 ENCOUNTER — Encounter: Payer: Self-pay | Admitting: Internal Medicine

## 2013-10-24 VITALS — BP 90/58 | HR 90 | Temp 97.7°F | Ht 67.5 in | Wt 164.0 lb

## 2013-10-24 DIAGNOSIS — R058 Other specified cough: Secondary | ICD-10-CM

## 2013-10-24 DIAGNOSIS — R059 Cough, unspecified: Secondary | ICD-10-CM

## 2013-10-24 DIAGNOSIS — R05 Cough: Secondary | ICD-10-CM

## 2013-10-24 LAB — MDC_IDC_ENUM_SESS_TYPE_REMOTE
Brady Statistic RA Percent Paced: 1 %
Brady Statistic RV Percent Paced: 6.2 %
Implantable Pulse Generator Serial Number: 499949
Lead Channel Impedance Value: 310 Ohm
Lead Channel Impedance Value: 360 Ohm
Lead Channel Setting Pacing Amplitude: 2 V
Lead Channel Setting Pacing Amplitude: 2 V
Lead Channel Setting Pacing Pulse Width: 0.5 ms
Lead Channel Setting Sensing Sensitivity: 0.3 mV
MDC IDC MSMT BATTERY REMAINING LONGEVITY: 24 mo
MDC IDC MSMT BATTERY VOLTAGE: 2.56 V
MDC IDC MSMT LEADCHNL RV SENSING INTR AMPL: 5.8 mV
MDC IDC SET ZONE DETECTION INTERVAL: 300 ms
MDC IDC SET ZONE DETECTION INTERVAL: 330 ms
Zone Setting Detection Interval: 250 ms

## 2013-10-24 MED ORDER — PANTOPRAZOLE SODIUM 40 MG PO TBEC: 40.0000 mg | DELAYED_RELEASE_TABLET | Freq: Every day | ORAL | Status: DC

## 2013-10-24 MED ORDER — PREDNISONE 10 MG PO TABS: ORAL_TABLET | ORAL | Status: DC

## 2013-10-24 MED ORDER — FAMOTIDINE 20 MG PO TABS: ORAL_TABLET | ORAL | Status: DC

## 2013-10-24 NOTE — Progress Notes (Signed)
Subjective:     Patient ID: Mario Cline, male   DOB: [birth year]  MRN: [id number]  HPI  89 yowm never smoker no previous resp problems except for OSA (Dr Annamaria Boots) referred to pulmonary clinic 10/24/2013 by Dr Willey Blade for new cough since early May 2015.  10/24/2013 1st [facility] office visit/ [facility]  Chief Complaint  Patient presents with  . Pulmonary Consult    Self referral for cough. Pt c/o non prod cough x 6 wks- esp worse at hs.    acute onset cough, never productive, with sore throat and nasal congestion > [place] > [facility] rx with nasal steroids but still lots of nasal congestion worse p gets up in am and cough worse at hs but never productive. Once gets started coughing has trouble stopping, sense of "something stuck in his throat and chest"  No obvious day to day or daytime variabilty or assoc chronic cough or cp or chest tightness, subjective wheeze overt sinus or hb symptoms. No unusual exp hx or h/o childhood pna/ asthma or knowledge of premature birth.  Sleeping ok without nocturnal  or early am exacerbation  of respiratory  c/o's or need for noct saba. Also denies any obvious fluctuation of symptoms with weather or environmental changes or other aggravating or alleviating factors except as outlined above   Current Medications, Allergies, Complete Past Medical History, Past Surgical History, Family History, and Social History were reviewed in [employer] record.  ROS  The following are not active complaints unless bolded sore throat, dysphagia, dental problems, itching, sneezing,  nasal congestion or excess/ purulent secretions, ear ache,   fever, chills, sweats, unintended wt loss, pleuritic or exertional cp, hemoptysis,  orthopnea pnd or leg swelling, presyncope, palpitations, heartburn, abdominal pain, anorexia, nausea, vomiting, diarrhea  or change in bowel or urinary habits, change in stools or urine, dysuria,hematuria,  rash, arthralgias, visual complaints,  headache, numbness weakness or ataxia or problems with walking or coordination,  change in mood/affect or memory.          Review of Systems     Objective:   Physical Exam     Wt Readings from Last 3 Encounters:  10/24/13 164 lb (74.39 kg)  10/17/13 167 lb 12.3 oz (76.1 kg)  10/17/13 167 lb 12.3 oz (76.1 kg)     frial elderly wm with dry  cough almost to point of gagging  HEENT: nl dentition, turbinates, and orophanx. Nl external ear canals without cough reflex   NECK :  without JVD/Nodes/TM/ nl carotid upstrokes bilaterally   LUNGS: no acc muscle use, clear to A and P bilaterally without cough on insp or exp maneuvers   CV:  RRR  no s3 or murmur or increase in P2,  1+ pitting edema   ABD:  soft and nontender with nl excursion in the supine position. No bruits or organomegaly, bowel sounds nl  MS:  warm without deformities, calf tenderness, cyanosis or clubbing  SKIN: warm and dry without lesions    NEURO:  alert, approp, no deficits    CT Chest 10/09/13 No active cardiopulmonary disease.  Cardiomegaly. No evidence of pulmonary edema  Assessment:

## 2013-10-24 NOTE — Progress Notes (Deleted)
   Subjective:    Patient ID: Mario Cline, male    DOB: [birth year], 80 y.o.   MRN: [id number]  HPI    Review of Systems  Constitutional: Negative for fever, chills, activity change, appetite change and unexpected weight change.  HENT: Negative for congestion, dental problem, postnasal drip, rhinorrhea, sneezing, sore throat, trouble swallowing and voice change.   Eyes: Negative for visual disturbance.  Respiratory: Positive for cough. Negative for choking and shortness of breath.   Cardiovascular: Negative for chest pain and leg swelling.  Gastrointestinal: Negative for nausea, vomiting and abdominal pain.  Genitourinary: Negative for difficulty urinating.  Musculoskeletal: Negative for arthralgias.  Skin: Negative for rash.  Psychiatric/Behavioral: Negative for behavioral problems and confusion.       Objective:   Physical Exam  Frail elderly wm nad  Wt Readings from Last 3 Encounters:  10/24/13 164 lb (74.39 kg)  10/17/13 167 lb 12.3 oz (76.1 kg)  10/17/13 167 lb 12.3 oz (76.1 kg)      HEENT: nl dentition, turbinates, and orophanx. Nl external ear canals without cough reflex   NECK :  without JVD/Nodes/TM/ nl carotid upstrokes bilaterally   LUNGS: no acc muscle use, clear to A and P bilaterally without cough on insp or exp maneuvers   CV:  RRR  no s3 or murmur or increase in P2, no edema   ABD:  soft and nontender with nl excursion in the supine position. No bruits or organomegaly, bowel sounds nl  MS:  warm without deformities, calf tenderness, cyanosis or clubbing  SKIN: warm and dry without lesions    NEURO:  alert, approp, no deficits        Assessment & Plan:

## 2013-10-24 NOTE — Patient Instructions (Addendum)
Pantoprazole (protonix) 40 mg   Take 30-60 min before first meal of the day and Pepcid 20 mg one bedtime until cough is gone for at least a week without the need for cough medication  Prednisone 10 mg take  4 each am x 2 days,   2 each am x 2 days,  1 each am x 2 days and stop   GERD (REFLUX)  is an extremely common cause of respiratory symptoms, many times with no significant heartburn at all.    It can be treated with medication, but also with lifestyle changes including avoidance of late meals, excessive alcohol, smoking cessation, and avoid fatty foods, chocolate, peppermint, colas, red wine, and acidic juices such as orange juice.  NO MINT OR MENTHOL PRODUCTS SO NO COUGH DROPS  USE SUGARLESS CANDY INSTEAD (jolley ranchers or Stover's)  NO OIL BASED VITAMINS - use powdered substitutes.  For drainage take chlortrimeton (chlorpheniramine) 4 mg every 4 hours available over the counter (may cause drowsiness)   If not better return in two weeks

## 2013-10-24 NOTE — Assessment & Plan Note (Signed)
The most common causes of chronic cough in immunocompetent adults include the following: upper airway cough syndrome (UACS), previously referred to as postnasal drip syndrome (PNDS), which is caused by variety of rhinosinus conditions; (2) asthma; (3) GERD; (4) chronic bronchitis from cigarette smoking or other inhaled environmental irritants; (5) nonasthmatic eosinophilic bronchitis; and (6) bronchiectasis.   These conditions, singly or in combination, have accounted for up to 94% of the causes of chronic cough in prospective studies.   Other conditions have constituted no >6% of the causes in prospective studies These have included bronchogenic carcinoma, chronic interstitial pneumonia, sarcoidosis, left ventricular failure, ACEI-induced cough, and aspiration from a condition associated with pharyngeal dysfunction.    Chronic cough is often simultaneously caused by more than one condition. A single cause has been found from 38 to 82% of the time, multiple causes from 18 to 62%. Multiply caused cough has been the result of three diseases up to 42% of the time.       Based on hx and exam, this is most likely: Not actually a pulmonary problem (though at risk of cough variant asthma on coreg and cardiac asthma related to chf)  But rather    Upper airway cough syndrome, so named because it's frequently impossible to sort out how much is  CR/sinusitis with freq throat clearing (which can be related to primary GERD)   vs  causing  secondary (" extra esophageal")  GERD from wide swings in gastric pressure that occur with throat clearing, often  promoting self use of mint and menthol lozenges that reduce the lower esophageal sphincter tone and exacerbate the problem further in a cyclical fashion.   These are the same pts (now being labeled as having "irritable larynx syndrome" by some cough centers) who not infrequently have a history of having failed to tolerate ace inhibitors,  dry powder inhalers or  biphosphonates or report having atypical reflux symptoms that don't respond to standard doses of PPI , and are easily confused as having aecopd or asthma flares by even experienced allergists/ pulmonologists.   The first step is to maximize acid suppression   then regroup if the cough persists.  See instructions for specific recommendations which were reviewed directly with the patient who was given a copy with highlighter outlining the key components.

## 2013-10-30 ENCOUNTER — Encounter: Payer: Self-pay | Admitting: Cardiovascular Disease

## 2013-10-30 ENCOUNTER — Encounter: Payer: Self-pay | Admitting: Cardiology

## 2013-10-30 ENCOUNTER — Encounter: Payer: Self-pay | Admitting: Internal Medicine

## 2013-10-30 ENCOUNTER — Ambulatory Visit (INDEPENDENT_AMBULATORY_CARE_PROVIDER_SITE_OTHER): Payer: Medicare Other | Admitting: Cardiovascular Disease

## 2013-10-30 ENCOUNTER — Ambulatory Visit (INDEPENDENT_AMBULATORY_CARE_PROVIDER_SITE_OTHER): Payer: Medicare Other | Admitting: Internal Medicine

## 2013-10-30 ENCOUNTER — Ambulatory Visit: Payer: Medicare Other | Admitting: Cardiovascular Disease

## 2013-10-30 VITALS — BP 106/82 | HR 101 | Ht 69.0 in | Wt 163.2 lb

## 2013-10-30 VITALS — BP 98/60 | HR 50 | Wt 160.0 lb

## 2013-10-30 DIAGNOSIS — N183 Chronic kidney disease, stage 3 unspecified: Secondary | ICD-10-CM

## 2013-10-30 DIAGNOSIS — I482 Chronic atrial fibrillation, unspecified: Secondary | ICD-10-CM

## 2013-10-30 DIAGNOSIS — E782 Mixed hyperlipidemia: Secondary | ICD-10-CM

## 2013-10-30 DIAGNOSIS — J441 Chronic obstructive pulmonary disease with (acute) exacerbation: Secondary | ICD-10-CM

## 2013-10-30 DIAGNOSIS — I4891 Unspecified atrial fibrillation: Secondary | ICD-10-CM

## 2013-10-30 DIAGNOSIS — I5022 Chronic systolic (congestive) heart failure: Secondary | ICD-10-CM

## 2013-10-30 DIAGNOSIS — N189 Chronic kidney disease, unspecified: Secondary | ICD-10-CM

## 2013-10-30 DIAGNOSIS — G4733 Obstructive sleep apnea (adult) (pediatric): Secondary | ICD-10-CM

## 2013-10-30 DIAGNOSIS — I428 Other cardiomyopathies: Secondary | ICD-10-CM

## 2013-10-30 DIAGNOSIS — I509 Heart failure, unspecified: Secondary | ICD-10-CM

## 2013-10-30 DIAGNOSIS — Z7901 Long term (current) use of anticoagulants: Secondary | ICD-10-CM

## 2013-10-30 DIAGNOSIS — Z95810 Presence of automatic (implantable) cardiac defibrillator: Secondary | ICD-10-CM

## 2013-10-30 DIAGNOSIS — E876 Hypokalemia: Secondary | ICD-10-CM

## 2013-10-30 DIAGNOSIS — J209 Acute bronchitis, unspecified: Secondary | ICD-10-CM

## 2013-10-30 DIAGNOSIS — K921 Melena: Secondary | ICD-10-CM

## 2013-10-30 DIAGNOSIS — J45901 Unspecified asthma with (acute) exacerbation: Secondary | ICD-10-CM

## 2013-10-30 DIAGNOSIS — I9589 Other hypotension: Secondary | ICD-10-CM

## 2013-10-30 DIAGNOSIS — I130 Hypertensive heart and chronic kidney disease with heart failure and stage 1 through stage 4 chronic kidney disease, or unspecified chronic kidney disease: Secondary | ICD-10-CM

## 2013-10-30 NOTE — Patient Instructions (Signed)
Your physician recommends that you schedule a follow-up appointment in: 3 months with Dr. Koneswaran  Your physician recommends that you continue on your current medications as directed. Please refer to the Current Medication list given to you today.  Thank you for choosing [facility]!    

## 2013-10-30 NOTE — Patient Instructions (Signed)
Try wearing your CPAP again. Just disconnect the hose from the mask when you ha e to get up at night, so it is easier. The CPAP would help your heart work better.

## 2013-10-30 NOTE — Progress Notes (Signed)
Subjective:    Patient ID: Mario Cline, male    DOB: [birth year], 80 y.o.   MRN: [id number]  HPI 12/08/10- 82 yoM followed for OSA, complicated by rhinitis, CM, AFib, chronic CHF.     Wife here Last here June 05, 2010- for review of normal PFT and 6MWT CPAP 11 [employer]- used all night every night and wife confirms he doesn't snore through it.  Bothersome watery rhinorhea resolved on its own so he never tried ipratropium   12/07/11- 83 yoM followed for OSA, complicated by rhinitis, CM, AFib, chronic CHF.         Wife here Wears for CPAP 11/ [facility] every night for approx 8 hours and doing well. Cardiology manages his cardiomyopathy-AICD/ pacer.  12/19/12- 71 yoM followed for OSA, complicated by rhinitis, CM, AFib, chronic CHF. Yearly follow up .  Wearing CPAP 11/ [facility]  every night for approx 7-8 hours.  Having trouble sleeping x 6-7 months.  Has daytime sleepiness. Nocturia wakes him frequently. If sits quietly can follow sleep. Cough and wheeze x1 month, no sputum or fever. CXR 11/27/12-  IMPRESSION:  Cardiomegaly without acute cardiopulmonary findings.  Original Report Authenticated By: Suzy Bouchard, M.D.   10/30/13- 84 yoM followed for OSA, complicated by rhinitis, CM, AFib, chronic CHF, cough (Dr Melvyn Novas) Yearly follow up .  CPAP 11/ [place]   Wife here FOLLOWS FOR: not wearing CPAP machine for about 2 months-was put in [facility] 01-2013 for CHF and up so much through the night using the restroom. Also, having a cough-occasionally productive white and thick. Bad cough not improved so far after seeing Dr Melvyn Novas. Denies reflux. Worse supine. Denies feeling smothered at night. Frequent nocturia. Homecare told him to unstrap and restrap mask- too much trouble. CT chest 10/10/03 IMPRESSION:  No active cardiopulmonary disease.  Cardiomegaly. No evidence of pulmonary edema.  Findings in the abdomen suggesting volume overload.  Electronically Signed  By: Maryclare Bean  M.D.  On: 10/09/2013 17:11  CXR 10/14/13 IMPRESSION:  Heart cardiomegaly without acute disease.  Electronically Signed  By: Inge Rise M.D.  On: 10/14/2013 19:40   ROS-see HPI Constitutional:   No-   weight loss, night sweats, fevers, chills, +fatigue, lassitude. HEENT:   No-  headaches, difficulty swallowing, tooth/dental problems, sore throat,       No-  sneezing, itching, ear ache, nasal congestion, post nasal drip,  CV:  No-   chest pain, orthopnea, PND, +swelling in lower extremities, anasarca, dizziness, palpitations Resp: No-   shortness of breath with exertion or at rest.              No-   productive cough,  + non-productive cough,  No- coughing up of blood.              No-   change in color of mucus.  No- wheezing.   Skin: No-   rash or lesions. GI:  No-   heartburn, indigestion, abdominal pain, nausea, vomiting,  GU: + nocturia MS:  No-   joint pain or swelling.   Neuro-     nothing unusual Psych:  No- change in mood or affect. No depression or anxiety.  No memory loss.  Objective:   Physical Exam  General- Alert, Oriented, Affect-appropriate, Distress- none acute, +weak/ chronically ill,                           Wheelchair   100%/ room air Skin- rash-none,  lesions- none, excoriation- none Lymphadenopathy- none Head- atraumatic            Eyes- Gross vision intact, PERRLA, conjunctivae clear secretions            Ears- Hearing, canals            Nose- Clear, No-Septal dev, mucus, polyps, erosion, perforation             Throat- Mallampati IV , mucosa clear , drainage- none, tonsils- atrophic Neck- flexible , trachea midline, no stridor , thyroid nl, carotid no bruit Chest - symmetrical excursion , unlabored           Heart/CV- RRR , no murmur , no gallop  , no rub, nl s1 s2                           + JVD 1 cm , edema 2+, stasis changes- none, varices- none           Lung- clear to P&A, wheeze- none, cough+raspy , dullness-none, rub- none, rales-none            Chest wall- +AICD  L Abd-  Br/ Gen/ Rectal- Not done, not indicated Extrem- cyanosis- none, clubbing, none, atrophy- none, strength- weak. Superficial varices. Neuro- grossly intact to observation    Assessment & Plan:

## 2013-10-30 NOTE — Progress Notes (Signed)
Patient ID: Mario Cline, male   DOB: 04/25/1929, 80 y.o.   MRN: [id number]      SUBJECTIVE: Mario Cline was recently hospitalized for a GI bleed. Xarelto has been on hold since that time. He underwent an upper endoscopy which revealed portal gastropathy and bulbar erosions but no ulcers or signs of malignancy. He underwent a capsule study which revealed no bleeding source. However, the capsule did not reach the cecum. He has a nonischemic myopathy with ICD in place, permanent atrial fibrillation, HTN, OSA on CPAP, thrombocytopenia, and had developed acute on chronic renal failure during his hospitalization.  Hgb 11.6 on 6/9. BUN 62, creatinine 1.98 on 6/10 (baseline 2.1-2.5).  Baseline weight reestablished at 153-156 lbs. Weight is 160 pounds in the office today.   Echocardiogram in 08/02/2013 showed severely reduced left ventricular systolic function, EF 16-07%, moderately dilated left ventricle with severe diffuse hypokinesis, moderate mitral regurgitation, mild aortic regurgitation, mildly dilated ascending aortic root, right-sided chamber dilatation, mild tricuspid regurgitation, and moderately elevated pulmonary pressures.  He denies any further episodes of melena. He continues to have a persistent cough. He has tried Tussionex and [employer] with no significant relief. Lately, he has been trying honey and lemon juice and this seems to have helped.  He denies any significant weight gain.  He and his wife are planning to go to [facility] for his birthday on July 1.   No Known Allergies  Current Outpatient Prescriptions  Medication Sig Dispense Refill  . allopurinol (ZYLOPRIM) 100 MG tablet Take 100 mg by mouth 2 (two) times daily.      . carvedilol (COREG) 6.25 MG tablet Take 3.125 mg by mouth 2 (two) times daily with a meal.      . famotidine (PEPCID) 20 MG tablet One at bedtime  30 tablet  2  . fluticasone (VERAMYST) 27.5 MCG/SPRAY nasal spray Place 2 sprays into the nose  daily.      . metolazone (ZAROXOLYN) 2.5 MG tablet Take 2.5 mg by mouth 3 (three) times a week. EVERY MON  Wed and FRI      . pantoprazole (PROTONIX) 40 MG tablet Take 1 tablet (40 mg total) by mouth daily. Take 30-60 min before first meal of the day  30 tablet  2  . potassium chloride SA (K-DUR,KLOR-CON) 20 MEQ tablet Take 1 tablet (20 mEq total) by mouth 3 (three) times daily.  90 tablet  5  . torsemide (DEMADEX) 20 MG tablet Take 40 mg by mouth 2 (two) times daily.       No current facility-administered medications for this visit.    Past Medical History  Diagnosis Date  . Nonischemic cardiomyopathy     Nonobstructive CAD 1997, LVEF 10-15% March 2015  . Essential hypertension, benign   . Orthostatic hypotension   . Obstructive sleep apnea 2002    CPAP prescribed  . DJD (degenerative joint disease)   . AICD (automatic cardioverter/defibrillator) present 06/2007    AICD/dual-chamber pacemaker - (St. Jude)- 2/09;   . Atrial fibrillation 04/2009    Rare brief episodes by ICD interrogation until 03/2011->persistent  . Chronic anticoagulation   . Chronic kidney disease, stage III (moderate)     Cardiorenal disease.    Past Surgical History  Procedure Laterality Date  . Tonsillectomy    . Cholecystectomy    . Posterior laminectomy / decompression lumbar spine  1985    +fusion; Dr. Pearlie Oyster  . Cardiac defibrillator placement  06/2007  . Esophagogastroduodenoscopy N/A 10/15/2013  Procedure: ESOPHAGOGASTRODUODENOSCOPY (EGD);  Surgeon: Rogene Houston, MD;  Location[facility];  Service: Endoscopy;  Laterality: N/A;  . Givens capsule study N/A 10/16/2013    Procedure: GIVENS CAPSULE STUDY;  Surgeon: Rogene Houston, MD;  Location: [facility];  Service: Endoscopy;  Laterality: N/A;    History   Social History  . Marital Status: Married    Spouse Name: N/A    Number of Children: 2  . Years of Education: N/A   Occupational History  . Retired     Engineer, building services   Social History Main Topics  . Smoking status: Never Smoker   . Smokeless tobacco: Never Used  . Alcohol Use: 0.5 oz/week    1 drink(s) per week  . Drug Use: No  . Sexual Activity: No   Other Topics Concern  . Not on file   Social History Narrative  . No narrative on file     Filed Vitals:   10/30/13 1300  BP: 98/60  Pulse: 50  Weight: 160 lb (72.576 kg)  SpO2: 98%    PHYSICAL EXAM General: Elderly, frail. NAD.  Neck: JVP ~8 . No thyromegaly or thyroid nodule.  Lungs: Clear to auscultation bilaterally with normal respiratory effort.  CV: Laterally displaced PMI. Heart irregularly irregular, normal S1/S2, +S3, no murmur appreciated.  Abdomen: Soft, nontender, no hepatosplenomegaly, nondistended.  Neurologic: Alert and oriented x 3.  Psych: Normal affect.  Extremities: No clubbing or cyanosis. 1+ pitting pretibial edema in b/l LE's.   ECG: reviewed and available in electronic records.      ASSESSMENT AND PLAN: 1. Chronic systolic heart failure. Has ST Jude ICD. EF 15% due to NICM. Last echo 07/2013.  NYHA IIIb. Volume status appears stable. Continue torsemide 40 mg bid, with metolazone 2.5 mg three times weekly. Metolazone to be held if SBP < 90 mmHg or if he is feeling dizzy.  Continue current dose of carvedilol 3.125 mg twice a day.  He is not on ace/dig/spiro due to CKD. BP too low for hydralazine/Imdur.  He and his wife have been limiting fluid intake to < 2 liters per day and consuming low sodium foods.  He is not candidate for LVAD or heart transplant due to his age but he may require inotropes at some point.   2. Chronic Atrial fibrillation: Rate controlled. Given recent GI bleed, I recommended considering anticoagulation with apixaban 2.5 mg bid. However, he and his wife would prefer he stay off of anticoagulation altogether. I think this is a reasonable option, although I did inform them of the future risk of a thromboembolic event.  3. CKD stage  IV, Creatinine baseline 2.1-2.5, most recently 1.98 on 6/10.  4. OSA- continue nightly CPAP.   Dispo: f/u 3 months.  Kate Sable, M.D., F.A.C.C.

## 2013-10-30 NOTE — Assessment & Plan Note (Signed)
Don't think cough is from bronchospasm. Pattern suggests interstitial edema/ pressure effects.

## 2013-10-30 NOTE — Assessment & Plan Note (Signed)
Urged to try to resume CPAP. Ok to disconnect hose from mask, rather than unstrapping  Each time he gets up. Suggested bedside urinal.

## 2013-10-31 ENCOUNTER — Telehealth: Payer: Self-pay | Admitting: *Deleted

## 2013-10-31 NOTE — Telephone Encounter (Signed)
Could start with over the counter treatment such as Pepto Bismol.

## 2013-10-31 NOTE — Telephone Encounter (Signed)
Made pt's wife aware.

## 2013-10-31 NOTE — Telephone Encounter (Signed)
Pt's wife states that pt is real nauseated and can not eat eat anything. Pt ihas diarrhea and is having a hard time taking his pills this morning. I advised her that pt needs to drink  A lot of fluids. Pt's wife wants to know what she can give him for nausea. Pt seen you yesterday in clinic.  Please advise.

## 2013-11-07 ENCOUNTER — Ambulatory Visit: Payer: Medicare Other | Admitting: Internal Medicine

## 2013-11-19 ENCOUNTER — Encounter: Payer: Self-pay | Admitting: Cardiovascular Disease

## 2013-11-19 ENCOUNTER — Telehealth: Payer: Self-pay | Admitting: Cardiovascular Disease

## 2013-11-19 MED ORDER — CARVEDILOL 3.125 MG PO TABS: 3.1250 mg | ORAL_TABLET | Freq: Two times a day (BID) | ORAL | Status: DC

## 2013-11-19 NOTE — Telephone Encounter (Signed)
Please see paper refill in bin / tgs

## 2013-12-07 ENCOUNTER — Ambulatory Visit: Payer: Medicare Other | Admitting: Internal Medicine

## 2013-12-10 ENCOUNTER — Ambulatory Visit: Payer: Medicare Other | Admitting: Internal Medicine

## 2013-12-13 ENCOUNTER — Ambulatory Visit (HOSPITAL_COMMUNITY)
Admission: RE | Admit: 2013-12-13 | Discharge: 2013-12-13 | Disposition: A | Discharge status: Home / Self Care | Payer: Medicare Other | Source: Ambulatory Visit | Attending: Cardiology | Admitting: Cardiology

## 2013-12-13 VITALS — BP 116/54 | HR 90 | Wt 159.0 lb

## 2013-12-13 DIAGNOSIS — I4891 Unspecified atrial fibrillation: Secondary | ICD-10-CM | POA: Insufficient documentation

## 2013-12-13 DIAGNOSIS — I428 Other cardiomyopathies: Secondary | ICD-10-CM | POA: Insufficient documentation

## 2013-12-13 DIAGNOSIS — I129 Hypertensive chronic kidney disease with stage 1 through stage 4 chronic kidney disease, or unspecified chronic kidney disease: Secondary | ICD-10-CM | POA: Insufficient documentation

## 2013-12-13 DIAGNOSIS — Z95810 Presence of automatic (implantable) cardiac defibrillator: Secondary | ICD-10-CM | POA: Diagnosis not present

## 2013-12-13 DIAGNOSIS — M1990 Unspecified osteoarthritis, unspecified site: Secondary | ICD-10-CM | POA: Insufficient documentation

## 2013-12-13 DIAGNOSIS — N183 Chronic kidney disease, stage 3 unspecified: Secondary | ICD-10-CM | POA: Insufficient documentation

## 2013-12-13 DIAGNOSIS — I482 Chronic atrial fibrillation, unspecified: Secondary | ICD-10-CM

## 2013-12-13 DIAGNOSIS — G4733 Obstructive sleep apnea (adult) (pediatric): Secondary | ICD-10-CM | POA: Diagnosis not present

## 2013-12-13 DIAGNOSIS — I5022 Chronic systolic (congestive) heart failure: Secondary | ICD-10-CM | POA: Insufficient documentation

## 2013-12-13 NOTE — Patient Instructions (Signed)
We will call you tomorrow about Heart Catheterization

## 2013-12-14 ENCOUNTER — Telehealth: Payer: Self-pay | Admitting: *Deleted

## 2013-12-14 NOTE — Telephone Encounter (Signed)
Pt wife is calling because pt leg is blood red, states that they had Dr Aundra Dubin look at it yesterday (did not have a appointment for it but just happen to see him)  Pt wife states it is worse today and want to bring him in to be seen

## 2013-12-14 NOTE — Progress Notes (Addendum)
Patient ID: Mario Cline, male   DOB: 1929/01/28, 80 y.o.   MRN: [id number]  PCP: Asencion Noble Cardiologist: Dr Bronson Ing  ADVANCED [facility] CLINIC NOTE  SUBJECTIVE: Mr. Hemp is an 80 y/o male has a h/o HTN, CKD (Cr 2.1-2.5), chronic AF on coumadin, OSA on CPAP and CHF due to nonischemic cardiomyopathy with EF 15% s/p St Jude ICD (cath 1997 with minimal non-obstructive CAD).   He was hospitalized in September 2014 with ADHF. Diuresed ~ 25 pounds. D/C weight ~ 168 pounds.   He was in the hospital in 6/15 with melena and Xarelto was stopped.   Patient remains quite frail.  He is drowsy during the visit today.  He comes with his son and wife.  He has had a number of issues recently.  He is off anticoagulation because of the GI bleed referenced above.  He has had a chronic cough with extensive workup.  He was found to have chronic sinusitis with culture growing Staph.  He was treated with moxifloxacin but developed a rash and stopped it. He did complete 2 wks.  He is very limited.  He can walk 75 feet to the mailbox but is very winded.  He is weak and drowsy most of the day per family.  He is not very active. He is taking metolazone only prn and has not used recently.  Labs 06/04/13 K 3.2 Creatinine 2.09 Labs 2/0/15 K 3.3 Creatinine 1.79  Labs 07/27/13 K 3.4 Creatinine 1.6 potassium increased to 120 meq daily  Labs 08/04/13 K 4.0 Creatinine 1.9 Labs 6/15 TSH normal Labs 7/15 K 4.5, creatinine 1.55, HCT 35.6  ECHO 09/20/2011 - Left ventricle: The cavity size was mildly to moderately dilated. Wall thickness was normal. Systolic function was severely reduced. The estimated ejection fraction was 15%. Severe diffuse hypokinesis. - Aortic valve: Moderately calcified annulus. Trileaflet. Trivial regurgitation. - Aorta: The aorta was moderately calcified. - Left atrium: The atrium was mildly to moderately dilated. - Right atrium: The atrium was mildly dilated.  ECHO 07/27/13  Left ventricle: The cavity size  was moderately dilated. Wall thickness was increased in a pattern of mild LVH. Systolic function is severely reduced, estimated EF 10-15%. Severe diffuse hypokinesis is seen LA - mod-severely dilated RV moderately dilated  RA- moderately dilated PA peak PA pressure 46 mm gh   Past Medical History  Diagnosis Date  . Nonischemic cardiomyopathy     Nonobstructive CAD 1997, LVEF 10-15% March 2015  . Essential hypertension, benign   . Orthostatic hypotension   . Obstructive sleep apnea 2002    CPAP prescribed  . DJD (degenerative joint disease)   . AICD (automatic cardioverter/defibrillator) present 06/2007    AICD/dual-chamber pacemaker - (St. Jude)- 2/09;   . Atrial fibrillation 04/2009    Rare brief episodes by ICD interrogation until 03/2011->persistent  . Chronic anticoagulation   . Chronic kidney disease, stage III (moderate)     Cardiorenal disease.    No Known Allergies  Current Outpatient Prescriptions  Medication Sig Dispense Refill  . allopurinol (ZYLOPRIM) 100 MG tablet Take 100 mg by mouth 2 (two) times daily.      . carvedilol (COREG) 3.125 MG tablet Take 1 tablet (3.125 mg total) by mouth 2 (two) times daily with a meal.  60 tablet  3  . ketoconazole (NIZORAL) 2 % cream Apply 1 application topically 2 (two) times daily.      . metolazone (ZAROXOLYN) 2.5 MG tablet Take 2.5 mg by mouth. Take if wt greater  than or equal to 168 lb      . moxifloxacin (AVELOX) 400 MG tablet Take 400 mg by mouth daily at 8 pm.      . potassium chloride SA (K-DUR,KLOR-CON) 20 MEQ tablet Take 1 tablet (20 mEq total) by mouth 3 (three) times daily.  90 tablet  5  . torsemide (DEMADEX) 20 MG tablet Take 40 mg by mouth 2 (two) times daily.       No current facility-administered medications for this encounter.    Past Medical History  Diagnosis Date  . Nonischemic cardiomyopathy     Nonobstructive CAD 1997, LVEF 10-15% March 2015  . Essential hypertension, benign   . Orthostatic  hypotension   . Obstructive sleep apnea 2002    CPAP prescribed  . DJD (degenerative joint disease)   . AICD (automatic cardioverter/defibrillator) present 06/2007    AICD/dual-chamber pacemaker - (St. Jude)- 2/09;   . Atrial fibrillation 04/2009    Rare brief episodes by ICD interrogation until 03/2011->persistent  . Chronic anticoagulation   . Chronic kidney disease, stage III (moderate)     Cardiorenal disease.    Past Surgical History  Procedure Laterality Date  . Tonsillectomy    . Cholecystectomy    . Posterior laminectomy / decompression lumbar spine  1985    +fusion; Dr. Pearlie Oyster  . Cardiac defibrillator placement  06/2007  . Esophagogastroduodenoscopy N/A 10/15/2013    Procedure: ESOPHAGOGASTRODUODENOSCOPY (EGD);  Surgeon: Rogene Houston, MD;  Location[facility];  Service: Endoscopy;  Laterality: N/A;  . Givens capsule study N/A 10/16/2013    Procedure: GIVENS CAPSULE STUDY;  Surgeon: Rogene Houston, MD;  Location: [facility];  Service: Endoscopy;  Laterality: N/A;    History   Social History  . Marital Status: Married    Spouse Name: N/A    Number of Children: 2  . Years of Education: N/A   Occupational History  . Retired     Insurance account manager   Social History Main Topics  . Smoking status: Never Smoker   . Smokeless tobacco: Never Used  . Alcohol Use: 0.5 oz/week    1 drink(s) per week  . Drug Use: No  . Sexual Activity: No   Other Topics Concern  . Not on file   Social History Narrative  . No narrative on file     Filed Vitals:   12/13/13 1414  BP: 116/54  Pulse: 90  Weight: 159 lb (72.122 kg)  SpO2: 96%    PHYSICAL EXAM General: Elderly, frail. NAD, Wife and son present.  Neck: JVP 8, no thyromegaly or thyroid nodule.  Lungs: Slight crackles at bases bilaterally. CV: Laterally displaced PMI.  Heart irregularly irregular, normal S1/S2, no S3, 2/6 MR    Abdomen: Soft, nontender, no hepatosplenomegaly, nondistended.   Neurologic: Alert and oriented x 3.  Psych: Normal affect. Extremities: No clubbing or cyanosis. 2+ edema 3/4 to knees bilaterally.    Skin: erythematous rash on left foot/ankle.   ASSESSMENT AND PLAN 1. Chronic systolic heart failure: Nonischemic cardiomyopathy.  Has ST Jude ICD.  ECHO 07/27/13 EF 15%.  NYHA class IIIb symptoms.  He is very weak and is drowsy in the office today.  Stable volume status, weight has actually gone down since last appointment.   However, I am concerned that a lot of his symptomatology is due to low cardiac output.   - Continue torsemide 40 mg twice a day and will have him use metolazone as he has  been doing (take dose if weight is not < 153 on home scale, no more than 2 times a week).  - Continue current dose of carvedilol 3.125 mg twice a day.  - He is not on ACEI, spironolactone, or digoxin at this time due to CKD. BP has been too soft for hydralazine/imdur.  - He is not candidate for LVAD or heart transplant.  I think that the only option we really have here for helping him feel better would be a home inotrope.  We discussed this today.  I emphasized that it would not make him live longer but could improve his quality of life if cardiac output is indeed low.  I suggested that we do a RHC then bring him in for milrinone initiation if cardiac output is low.  He and his family want to talk about this.  We will call him tomorrow to see what he wants to do.  2. Chronic Atrial fibrillation: rate controlled, off anticoagulation now due to GI bleeding and fall risk.  3. CKD: Stage III-IV. Creatinine recently has actually been lower his prior baseline (1.55). 4. OSA: Continue nightly CPAP.   Loralie Champagne 12/14/2013

## 2013-12-14 NOTE — Telephone Encounter (Signed)
Spoke with wife,she has already made an apt with pcp today

## 2013-12-14 NOTE — Addendum Note (Signed)
Encounter addended by: Larey Dresser, MD on: 12/14/2013 12:23 AM<BR>     Documentation filed: Notes Section

## 2013-12-17 ENCOUNTER — Other Ambulatory Visit (HOSPITAL_COMMUNITY): Payer: Self-pay

## 2013-12-17 ENCOUNTER — Telehealth (HOSPITAL_COMMUNITY): Payer: Self-pay | Admitting: Vascular Surgery

## 2013-12-17 NOTE — Telephone Encounter (Signed)
Pt son has some question about a procedure Dr.Mclean scheduled..please advise

## 2013-12-17 NOTE — Telephone Encounter (Signed)
Spoke w/pt's son, explained RHC and possibility of home Milrinone, he is going to explain to parents and call me back to let me know if they wish to proceed

## 2013-12-19 NOTE — Telephone Encounter (Signed)
Spoke w/Chuck he states they are still undecided about proceeding, pt is seeing pcp today and they were going to discuss with him also, he states he will call me back in a day or 2 and let me know what they decide

## 2013-12-22 ENCOUNTER — Inpatient Hospital Stay (HOSPITAL_COMMUNITY)
Admission: EM | Admit: 2013-12-22 | Discharge: 2013-12-24 | DRG: 378 | Disposition: A | Discharge status: Home / Self Care | Payer: Medicare Other | Attending: Internal Medicine | Admitting: Internal Medicine

## 2013-12-22 ENCOUNTER — Emergency Department (HOSPITAL_COMMUNITY): Payer: Medicare Other

## 2013-12-22 ENCOUNTER — Encounter (HOSPITAL_COMMUNITY): Payer: Self-pay | Admitting: Emergency Medicine

## 2013-12-22 DIAGNOSIS — I2510 Atherosclerotic heart disease of native coronary artery without angina pectoris: Secondary | ICD-10-CM | POA: Diagnosis present

## 2013-12-22 DIAGNOSIS — Z8249 Family history of ischemic heart disease and other diseases of the circulatory system: Secondary | ICD-10-CM

## 2013-12-22 DIAGNOSIS — N183 Chronic kidney disease, stage 3 unspecified: Secondary | ICD-10-CM | POA: Diagnosis present

## 2013-12-22 DIAGNOSIS — I255 Ischemic cardiomyopathy: Secondary | ICD-10-CM

## 2013-12-22 DIAGNOSIS — I10 Essential (primary) hypertension: Secondary | ICD-10-CM

## 2013-12-22 DIAGNOSIS — K922 Gastrointestinal hemorrhage, unspecified: Secondary | ICD-10-CM | POA: Diagnosis present

## 2013-12-22 DIAGNOSIS — I509 Heart failure, unspecified: Secondary | ICD-10-CM | POA: Diagnosis present

## 2013-12-22 DIAGNOSIS — K644 Residual hemorrhoidal skin tags: Secondary | ICD-10-CM | POA: Diagnosis present

## 2013-12-22 DIAGNOSIS — W44F3XA Food entering into or through a natural orifice, initial encounter: Secondary | ICD-10-CM

## 2013-12-22 DIAGNOSIS — M1990 Unspecified osteoarthritis, unspecified site: Secondary | ICD-10-CM | POA: Diagnosis present

## 2013-12-22 DIAGNOSIS — Z7901 Long term (current) use of anticoagulants: Secondary | ICD-10-CM | POA: Diagnosis not present

## 2013-12-22 DIAGNOSIS — I5022 Chronic systolic (congestive) heart failure: Secondary | ICD-10-CM | POA: Diagnosis present

## 2013-12-22 DIAGNOSIS — I959 Hypotension, unspecified: Secondary | ICD-10-CM

## 2013-12-22 DIAGNOSIS — Z95810 Presence of automatic (implantable) cardiac defibrillator: Secondary | ICD-10-CM | POA: Diagnosis not present

## 2013-12-22 DIAGNOSIS — I130 Hypertensive heart and chronic kidney disease with heart failure and stage 1 through stage 4 chronic kidney disease, or unspecified chronic kidney disease: Secondary | ICD-10-CM | POA: Diagnosis present

## 2013-12-22 DIAGNOSIS — G4733 Obstructive sleep apnea (adult) (pediatric): Secondary | ICD-10-CM | POA: Diagnosis present

## 2013-12-22 DIAGNOSIS — K921 Melena: Principal | ICD-10-CM | POA: Diagnosis present

## 2013-12-22 DIAGNOSIS — B354 Tinea corporis: Secondary | ICD-10-CM | POA: Diagnosis present

## 2013-12-22 DIAGNOSIS — T18128A Food in esophagus causing other injury, initial encounter: Secondary | ICD-10-CM

## 2013-12-22 DIAGNOSIS — I2589 Other forms of chronic ischemic heart disease: Secondary | ICD-10-CM | POA: Diagnosis present

## 2013-12-22 DIAGNOSIS — I428 Other cardiomyopathies: Secondary | ICD-10-CM | POA: Diagnosis present

## 2013-12-22 DIAGNOSIS — K625 Hemorrhage of anus and rectum: Secondary | ICD-10-CM | POA: Diagnosis present

## 2013-12-22 DIAGNOSIS — I4891 Unspecified atrial fibrillation: Secondary | ICD-10-CM | POA: Diagnosis present

## 2013-12-22 DIAGNOSIS — N039 Chronic nephritic syndrome with unspecified morphologic changes: Secondary | ICD-10-CM

## 2013-12-22 LAB — CBC WITH DIFFERENTIAL/PLATELET
BASOS PCT: 0 % (ref 0–1)
Basophils Absolute: 0 10*3/uL (ref 0.0–0.1)
Eosinophils Absolute: 0.1 10*3/uL (ref 0.0–0.7)
Eosinophils Relative: 2 % (ref 0–5)
HCT: 40 % (ref 39.0–52.0)
HEMOGLOBIN: 13.5 g/dL (ref 13.0–17.0)
LYMPHS ABS: 1.7 10*3/uL (ref 0.7–4.0)
Lymphocytes Relative: 35 % (ref 12–46)
MCH: 35.9 pg — ABNORMAL HIGH (ref 26.0–34.0)
MCHC: 33.8 g/dL (ref 30.0–36.0)
MCV: 106.4 fL — ABNORMAL HIGH (ref 78.0–100.0)
MONOS PCT: 6 % (ref 3–12)
Monocytes Absolute: 0.3 10*3/uL (ref 0.1–1.0)
NEUTROS ABS: 2.8 10*3/uL (ref 1.7–7.7)
NEUTROS PCT: 56 % (ref 43–77)
Platelets: 95 10*3/uL — ABNORMAL LOW (ref 150–400)
RBC: 3.76 MIL/uL — ABNORMAL LOW (ref 4.22–5.81)
RDW: 16.7 % — ABNORMAL HIGH (ref 11.5–15.5)
WBC: 4.9 10*3/uL (ref 4.0–10.5)

## 2013-12-22 LAB — COMPREHENSIVE METABOLIC PANEL
ALT: 33 U/L (ref 0–53)
AST: 36 U/L (ref 0–37)
Albumin: 2.8 g/dL — ABNORMAL LOW (ref 3.5–5.2)
Alkaline Phosphatase: 168 U/L — ABNORMAL HIGH (ref 39–117)
Anion gap: 12 (ref 5–15)
BUN: 49 mg/dL — ABNORMAL HIGH (ref 6–23)
CO2: 25 meq/L (ref 19–32)
Calcium: 8.2 mg/dL — ABNORMAL LOW (ref 8.4–10.5)
Chloride: 103 mEq/L (ref 96–112)
Creatinine, Ser: 1.58 mg/dL — ABNORMAL HIGH (ref 0.50–1.35)
GFR calc Af Amer: 45 mL/min — ABNORMAL LOW (ref >90)
GFR calc non Af Amer: 39 mL/min — ABNORMAL LOW (ref >90)
Glucose, Bld: 105 mg/dL — ABNORMAL HIGH (ref 70–99)
POTASSIUM: 4.1 meq/L (ref 3.7–5.3)
SODIUM: 140 meq/L (ref 137–147)
TOTAL PROTEIN: 5.1 g/dL — AB (ref 6.0–8.3)
Total Bilirubin: 1.9 mg/dL — ABNORMAL HIGH (ref 0.3–1.2)

## 2013-12-22 LAB — PROTIME-INR
INR: 1.41 (ref 0.00–1.49)
Prothrombin Time: 17.3 seconds — ABNORMAL HIGH (ref 11.6–15.2)

## 2013-12-22 LAB — LACTIC ACID, PLASMA: Lactic Acid, Venous: 2.9 mmol/L — ABNORMAL HIGH (ref 0.5–2.2)

## 2013-12-22 LAB — I-STAT TROPONIN, ED: Troponin i, poc: 0.01 ng/mL (ref 0.00–0.08)

## 2013-12-22 LAB — APTT: aPTT: 35 seconds (ref 24–37)

## 2013-12-22 LAB — PRO B NATRIURETIC PEPTIDE: Pro B Natriuretic peptide (BNP): 28328 pg/mL — ABNORMAL HIGH (ref 0–450)

## 2013-12-22 LAB — TYPE AND SCREEN
ABO/RH(D): O POS
ANTIBODY SCREEN: NEGATIVE

## 2013-12-22 LAB — MRSA PCR SCREENING: MRSA by PCR: NEGATIVE

## 2013-12-22 MED ORDER — ALLOPURINOL 100 MG PO TABS
100.0000 mg | ORAL_TABLET | Freq: Two times a day (BID) | ORAL | Status: DC
Start: 2013-12-22 — End: 2013-12-24
  Administered 2013-12-22 – 2013-12-24 (×4): 100 mg via ORAL
  Filled 2013-12-22 (×9): qty 1

## 2013-12-22 MED ORDER — KETOCONAZOLE 2 % EX CREA
TOPICAL_CREAM | CUTANEOUS | Status: AC
  Filled 2013-12-22: qty 15

## 2013-12-22 MED ORDER — SODIUM CHLORIDE 0.9 % IV BOLUS (SEPSIS)
250.0000 mL | Freq: Once | INTRAVENOUS | Status: AC
  Administered 2013-12-22: 250 mL via INTRAVENOUS

## 2013-12-22 MED ORDER — NYSTATIN 100000 UNIT/ML MT SUSP
5.0000 mL | Freq: Four times a day (QID) | OROMUCOSAL | Status: DC
  Administered 2013-12-22 – 2013-12-24 (×6): 500000 [IU] via ORAL
  Filled 2013-12-22 (×6): qty 5

## 2013-12-22 MED ORDER — CARVEDILOL 3.125 MG PO TABS
3.1250 mg | ORAL_TABLET | Freq: Two times a day (BID) | ORAL | Status: DC
  Administered 2013-12-23: 3.125 mg via ORAL
  Filled 2013-12-22 (×5): qty 1

## 2013-12-22 MED ORDER — KETOCONAZOLE 2 % EX CREA
1.0000 "application " | TOPICAL_CREAM | Freq: Two times a day (BID) | CUTANEOUS | Status: DC
  Administered 2013-12-23 – 2013-12-24 (×3): 1 via TOPICAL
  Filled 2013-12-22: qty 15

## 2013-12-22 MED ORDER — SODIUM CHLORIDE 0.9 % IV SOLN
1000.0000 mL | INTRAVENOUS | Status: DC
  Administered 2013-12-23: 1000 mL via INTRAVENOUS

## 2013-12-22 MED ORDER — POTASSIUM CHLORIDE CRYS ER 20 MEQ PO TBCR
20.0000 meq | EXTENDED_RELEASE_TABLET | Freq: Three times a day (TID) | ORAL | Status: DC
  Administered 2013-12-22 – 2013-12-24 (×5): 20 meq via ORAL
  Filled 2013-12-22 (×5): qty 1

## 2013-12-22 MED ORDER — ONDANSETRON HCL 4 MG/2ML IJ SOLN: 4.0000 mg | Freq: Four times a day (QID) | INTRAMUSCULAR | Status: DC | PRN

## 2013-12-22 MED ORDER — TORSEMIDE 20 MG PO TABS
40.0000 mg | ORAL_TABLET | Freq: Two times a day (BID) | ORAL | Status: DC
  Administered 2013-12-23 – 2013-12-24 (×3): 40 mg via ORAL
  Filled 2013-12-22 (×8): qty 2

## 2013-12-22 MED ORDER — SODIUM CHLORIDE 0.9 % IJ SOLN
3.0000 mL | Freq: Two times a day (BID) | INTRAMUSCULAR | Status: DC
  Administered 2013-12-22 – 2013-12-24 (×4): 3 mL via INTRAVENOUS

## 2013-12-22 MED ORDER — ONDANSETRON HCL 4 MG PO TABS: 4.0000 mg | ORAL_TABLET | Freq: Four times a day (QID) | ORAL | Status: DC | PRN

## 2013-12-22 NOTE — ED Notes (Signed)
Dr Anastasio Champion at bedside, pt incontinent of urine while attempting to stand to use the urinal, gown and bed linen changed, pt and family updated on plan of care,

## 2013-12-22 NOTE — ED Notes (Signed)
Pt family reports rectal bleeding yesterday and today. Pt taken off Xarelto x 1 month ago.

## 2013-12-22 NOTE — ED Notes (Signed)
Fully alert, w/d, pale. Denies pain, nausea or further BM.

## 2013-12-22 NOTE — ED Notes (Signed)
Pt c/o bright red rectal bleeding that has been intermittent since two nights ago, pt alert, jaundice but family states that pt skin color is normal, able to answer questions, cms intact all extremities, pt denies any weakness, dizziness but family states that pt has been "weaker lately", pt denies any pain, Dr Tomi Bamberger at bedside, pt bp 78/57 in treatment room, family states that is pt's normal. On occasion pt will reach systolic 41'Q,

## 2013-12-22 NOTE — ED Notes (Signed)
Dr Knapp at bedside,  

## 2013-12-22 NOTE — H&P (Signed)
[facility] History and Physical  Mario Cline XKG:[id number] DOB: March 07, 1929 DOA: 12/22/2013  Referring physician: ER. PCP: Asencion Noble, MD   Chief Complaint: Rectal bleeding.  HPI: Mario Cline is a 80 y.o. male  This is an 80 year old man, who has cardiomyopathy and previously was on anticoagulation but no longer is, now presents with rectal bleeding for the last 2-3 days intermittently. He denies any hematemesis, abdominal pain or vomiting. Is now being admitted for further management. In early June of this year he was admitted with rectal bleeding with melena. Upper GI endoscopy revealed portal gastropathy and bulbar erosions but no ulcers or signs of malignancy. He was given a capsule study but the capsule did not make it to the cecum. Colonoscopy was considered but this was not done at that time. Anticoagulation was discontinued. He is now being admitted for further management.   Review of Systems:  Constitutional:  No weight loss, night sweats, Fevers, chills, fatigue.  HEENT:  No headaches, Difficulty swallowing,Tooth/dental problems,Sore throat,  No sneezing, itching, ear ache, nasal congestion, post nasal drip,  Cardio-vascular:  No chest pain, Orthopnea, PND, swelling in lower extremities, anasarca, dizziness, palpitations   Resp:  No shortness of breath with exertion or at rest. No excess mucus, no productive cough, No non-productive cough, No coughing up of blood.No change in color of mucus.No wheezing.No chest wall deformity  Skin:  no rash or lesions.  GU:  no dysuria, change in color of urine, no urgency or frequency. No flank pain.  Musculoskeletal:  No joint pain or swelling. No decreased range of motion. No back pain.  Psych:  No change in mood or affect. No depression or anxiety. No memory loss.   Past Medical History  Diagnosis Date  . Nonischemic cardiomyopathy     Nonobstructive CAD 1997, LVEF 10-15% March 2015  . Essential hypertension, benign   .  Orthostatic hypotension   . Obstructive sleep apnea 2002    CPAP prescribed  . DJD (degenerative joint disease)   . AICD (automatic cardioverter/defibrillator) present 06/2007    AICD/dual-chamber pacemaker - (St. Jude)- 2/09;   . Atrial fibrillation 04/2009    Rare brief episodes by ICD interrogation until 03/2011->persistent  . Chronic anticoagulation   . Chronic kidney disease, stage III (moderate)     Cardiorenal disease.   Past Surgical History  Procedure Laterality Date  . Tonsillectomy    . Cholecystectomy    . Posterior laminectomy / decompression lumbar spine  1985    +fusion; Dr. Pearlie Oyster  . Cardiac defibrillator placement  06/2007  . Esophagogastroduodenoscopy N/A 10/15/2013    Procedure: ESOPHAGOGASTRODUODENOSCOPY (EGD);  Surgeon: Rogene Houston, MD;  [facility]: [facility];  Service: Endoscopy;  Laterality: N/A;  . Givens capsule study N/A 10/16/2013    Procedure: GIVENS CAPSULE STUDY;  Surgeon: Rogene Houston, MD;  Location: [facility];  Service: Endoscopy;  Laterality: N/A;   Social History:  reports that he has never smoked. He has never used smokeless tobacco. He reports that he drinks about .5 ounces of alcohol per week. He reports that he does not use illicit drugs.  Allergies  Allergen Reactions  . Avelox [Moxifloxacin Hcl In Nacl] Other (See Comments)    thrush    Family History  Problem Relation Age of Onset  . Heart failure Mother   . Heart attack Father   . Cancer Brother     x2     Prior to Admission medications   Medication Sig Start  Date End Date Taking? Authorizing Provider  allopurinol (ZYLOPRIM) 100 MG tablet Take 100 mg by mouth 2 (two) times daily. 06/07/13  Yes Jolaine Artist, MD  carvedilol (COREG) 3.125 MG tablet Take 1 tablet (3.125 mg total) by mouth 2 (two) times daily with a meal. 11/19/13  Yes Herminio Commons, MD  ketoconazole (NIZORAL) 2 % cream Apply 1 application topically 2 (two) times daily.   Yes Historical Provider, MD    nystatin (MYCOSTATIN) 100000 UNIT/ML suspension Take 5 mLs by mouth 4 (four) times daily.   Yes Historical Provider, MD  potassium chloride SA (K-DUR,KLOR-CON) 20 MEQ tablet Take 1 tablet (20 mEq total) by mouth 3 (three) times daily. 10/17/13  Yes Asencion Noble, MD  torsemide (DEMADEX) 20 MG tablet Take 40 mg by mouth 2 (two) times daily. 02/06/13  Yes Asencion Noble, MD  metolazone (ZAROXOLYN) 2.5 MG tablet Take 2.5 mg by mouth. Take if wt greater than or equal to 168 lb 07/09/13   Conrad Millingport, NP   Physical Exam: Filed Vitals:   12/22/13 1640 12/22/13 1708 12/22/13 1730 12/22/13 1843  BP: 88/49 78/57 82/32  97/71  Pulse: 94 72  110  Temp: 97.8 F (36.6 C)     TempSrc: Oral     Resp: 18 20  16   Height: 5\' 9"  (1.753 m)     Weight: 67.132 kg (148 lb)     SpO2: 97% 97%  100%    Wt Readings from Last 3 Encounters:  12/22/13 67.132 kg (148 lb)  12/13/13 72.122 kg (159 lb)  10/30/13 72.576 kg (160 lb)    General:  Appears calm and comfortable. He looks chronically sick. Eyes: PERRL, normal lids, irises & conjunctiva ENT: grossly normal hearing, lips & tongue Neck: no LAD, masses or thyromegaly Cardiovascular appears to have irregular heart sounds. Jugular venous pressure somewhat elevated. He does have leg edema which I think is probably chronic. Telemetry: SR, no arrhythmias  Respiratory: CTA bilaterally, no w/r/r. Normal respiratory effort. Abdomen: soft, ntnd Skin: no rash or induration seen on limited exam Musculoskeletal: grossly normal tone BUE/BLE Psychiatric: grossly normal mood and affect, speech fluent and appropriate Neurologic: grossly non-focal.          Labs on Admission:  Basic Metabolic Panel:  Recent Labs Lab 12/22/13 1718  NA 140  K 4.1  CL 103  CO2 25  GLUCOSE 105*  BUN 49*  CREATININE 1.58*  CALCIUM 8.2*   Liver Function Tests:  Recent Labs Lab 12/22/13 1718  AST 36  ALT 33  ALKPHOS 168*  BILITOT 1.9*  PROT 5.1*  ALBUMIN 2.8*   No results found  for this basename: LIPASE, AMYLASE,  in the last 168 hours No results found for this basename: AMMONIA,  in the last 168 hours CBC:  Recent Labs Lab 12/22/13 1718  WBC 4.9  NEUTROABS 2.8  HGB 13.5  HCT 40.0  MCV 106.4*  PLT 95*      BNP (last 3 results)  Recent Labs  02/03/13 0605 10/14/13 1930 12/22/13 1718  PROBNP 9243.0* 16323.0* 28328.0*   CBG: No results found for this basename: GLUCAP,  in the last 168 hours  Radiological Exams on Admission: Dg Chest Portable 1 View  12/22/2013   CLINICAL DATA:  Rectal bleeding and weakness.  EXAM: PORTABLE CHEST - 1 VIEW  COMPARISON:  10/14/2013  FINDINGS: Pacer/AICD device.  Leads at right atrium and right ventricle.  Midline trachea. Moderate cardiomegaly with a mildly tortuous thoracic aorta. No pleural  effusion or pneumothorax. No congestive failure. No lobar consolidation.  IMPRESSION: Cardiomegaly without congestive failure.   Electronically Signed   By: Abigail Miyamoto M.D.   On: 12/22/2013 17:51      Assessment/Plan   1. Rectal bleeding, unclear etiology. 2. Nonischemic cardiomyopathy with chronic systolic congestive heart failure, appears to be stable. 3. AICD in situ. 4. Hypertension.  Plan: 1. Admit to step down unit. 2. Monitor hemoglobin closely. 3. Clear fluid diet. 4. Gastroenterology consultation in the morning.   Other recommendations will depend on patient's hospital progress.   Code Status: Limited code.  DVT Prophylaxis: SCDs.  Family Communication: I discussed the plan with patient at the bedside.  Disposition Plan: Home when medically stable.   Time spent: 60 minutes.  Doree Albee [facility] Pager [phone].  **Disclaimer: This note may have been dictated with voice recognition software. Similar sounding words can inadvertently be transcribed and this note may contain transcription errors which may not have been corrected upon publication of note.**

## 2013-12-22 NOTE — ED Provider Notes (Addendum)
CSN: [id number]     Arrival date & time 12/22/13  1635 History  This chart was scribed for Dorie Rank, MD by Jeanell Sparrow, ED Scribe. This patient was seen in room [facility]14[facility] and the patient's care was started at 5:07 PM.   Chief Complaint  Patient presents with  . Rectal Bleeding   The history is provided by a relative. No language interpreter was used.   HPI Comments: Mario Cline is a 80 y.o. male who presents to the Emergency Department complaining of moderate intermittent rectal bleeding that started 2 nights ago. His family member reports that he had an episode 2 nights ago but not one last night. She reports that pt has been producing bright red stool. She states that pt has lighted headedness and dizziness that interfere with his ability to stand and ambulate. She reports that pt is not on any blood thinners. She reports that pt's blood pressure is usually in the 90/60s due to his CHF. She denies any new diarrhea, nausea, emesis, fever, chest pain, or SOB.  Past Medical History  Diagnosis Date  . Nonischemic cardiomyopathy     Nonobstructive CAD 1997, LVEF 10-15% March 2015  . Essential hypertension, benign   . Orthostatic hypotension   . Obstructive sleep apnea 2002    CPAP prescribed  . DJD (degenerative joint disease)   . AICD (automatic cardioverter/defibrillator) present 06/2007    AICD/dual-chamber pacemaker - (St. Jude)- 2/09;   . Atrial fibrillation 04/2009    Rare brief episodes by ICD interrogation until 03/2011->persistent  . Chronic anticoagulation   . Chronic kidney disease, stage III (moderate)     Cardiorenal disease.   Past Surgical History  Procedure Laterality Date  . Tonsillectomy    . Cholecystectomy    . Posterior laminectomy / decompression lumbar spine  1985    +fusion; Dr. Pearlie Oyster  . Cardiac defibrillator placement  06/2007  . Esophagogastroduodenoscopy N/A 10/15/2013    Procedure: ESOPHAGOGASTRODUODENOSCOPY (EGD);  Surgeon: Rogene Houston, MD;   Location: [facility];  Service: Endoscopy;  Laterality: N/A;  . Givens capsule study N/A 10/16/2013    Procedure: GIVENS CAPSULE STUDY;  Surgeon: Rogene Houston, MD;  Location: [facility];  Service: Endoscopy;  Laterality: N/A;   Family History  Problem Relation Age of Onset  . Heart failure Mother   . Heart attack Father   . Cancer Brother     x2   History  Substance Use Topics  . Smoking status: Never Smoker   . Smokeless tobacco: Never Used  . Alcohol Use: 0.5 oz/week    1 drink(s) per week    Review of Systems  Constitutional: Negative for fever.  Respiratory: Negative for shortness of breath.   Cardiovascular: Negative for chest pain.  Gastrointestinal: Negative for nausea, vomiting and diarrhea.  Neurological: Positive for dizziness and light-headedness.  All other systems reviewed and are negative.   Allergies  Avelox  Home Medications   Prior to Admission medications   Medication Sig Start Date End Date Taking? Authorizing Provider  allopurinol (ZYLOPRIM) 100 MG tablet Take 100 mg by mouth 2 (two) times daily. 06/07/13  Yes Jolaine Artist, MD  carvedilol (COREG) 3.125 MG tablet Take 1 tablet (3.125 mg total) by mouth 2 (two) times daily with a meal. 11/19/13  Yes Herminio Commons, MD  ketoconazole (NIZORAL) 2 % cream Apply 1 application topically 2 (two) times daily.   Yes Historical Provider, MD  nystatin (MYCOSTATIN) 100000 UNIT/ML suspension Take  5 mLs by mouth 4 (four) times daily.   Yes Historical Provider, MD  potassium chloride SA (K-DUR,KLOR-CON) 20 MEQ tablet Take 1 tablet (20 mEq total) by mouth 3 (three) times daily. 10/17/13  Yes Asencion Noble, MD  torsemide (DEMADEX) 20 MG tablet Take 40 mg by mouth 2 (two) times daily. 02/06/13  Yes Asencion Noble, MD  metolazone (ZAROXOLYN) 2.5 MG tablet Take 2.5 mg by mouth. Take if wt greater than or equal to 168 lb 07/09/13   Amy D Clegg, NP   BP 88/49  Pulse 94  Temp(Src) 97.8 F (36.6 C) (Oral)  Resp 18  Ht 5'  9" (1.753 m)  Wt 148 lb (67.132 kg)  BMI 21.85 kg/m2  SpO2 97% Physical Exam  Nursing note and vitals reviewed. Constitutional: No distress.  Frail.   HENT:  Head: Normocephalic and atraumatic.  Right Ear: External ear normal.  Left Ear: External ear normal.  Eyes: Conjunctivae are normal. Right eye exhibits no discharge. Left eye exhibits no discharge. No scleral icterus.  Neck: Neck supple. No tracheal deviation present.  Cardiovascular: Normal rate, regular rhythm and intact distal pulses.   Pulmonary/Chest: Effort normal and breath sounds normal. No stridor. No respiratory distress. He has no wheezes. He has no rales.  Abdominal: Soft. Bowel sounds are normal. He exhibits no distension. There is no tenderness. There is no rebound and no guarding.  Genitourinary: Rectal exam shows external hemorrhoid. Rectal exam shows no mass.  No bright red blood.  Musculoskeletal: He exhibits edema. He exhibits no tenderness.  Pitting bilaterally.   Neurological: He is alert. No cranial nerve deficit (no facial droop, extraocular movements intact, no slurred speech) or sensory deficit. He exhibits normal muscle tone. He displays no seizure activity. Coordination normal.  Generalized weakness  Skin: Skin is warm and dry. No rash noted.  Psychiatric: He has a normal mood and affect.    ED Course  Procedures (including critical care time) DIAGNOSTIC STUDIES: Oxygen Saturation is 97% on RA, normal by my interpretation.    COORDINATION OF CARE: 5:11 PM- Pt advised of plan for treatment which includes medication, radiology, and labs and pt agrees.  CRITICAL CARE Performed by: EPPIR,JJO Total critical care time: 35 Critical care time was exclusive of separately billable procedures and treating other patients. Critical care was necessary to treat or prevent imminent or life-threatening deterioration. Critical care was time spent personally by me on the following activities: development of  treatment plan with patient and/or surrogate as well as nursing, discussions with consultants, evaluation of patient's response to treatment, examination of patient, obtaining history from patient or surrogate, ordering and performing treatments and interventions, ordering and review of laboratory studies, ordering and review of radiographic studies, pulse oximetry and re-evaluation of patient's condition.  [place] unchanged.  Pt without complaints.  BP remains low.  Does not appear to be in any distress. 1800  Reviewed outpatient records.  Pt has severe cardiomyopathy and CHF.  Cardiology office notes indicate that they were discussing continuous outpatient infusion milrinone. 1820  discussed findings with the patient and the family. I also confirmed his CODE STATUS. The family and the patient would want defibrillation and medications but they would not want intubation or CPR.  Labs Review Labs Reviewed  CBC WITH DIFFERENTIAL - Abnormal; Notable for the following:    RBC 3.76 (*)    MCV 106.4 (*)    MCH 35.9 (*)    RDW 16.7 (*)    Platelets 95 (*)  All other components within normal limits  COMPREHENSIVE METABOLIC PANEL - Abnormal; Notable for the following:    Glucose, Bld 105 (*)    BUN 49 (*)    Creatinine, Ser 1.58 (*)    Calcium 8.2 (*)    Total Protein 5.1 (*)    Albumin 2.8 (*)    Alkaline Phosphatase 168 (*)    Total Bilirubin 1.9 (*)    GFR calc non Af Amer 39 (*)    GFR calc Af Amer 45 (*)    All other components within normal limits  PROTIME-INR - Abnormal; Notable for the following:    Prothrombin Time 17.3 (*)    All other components within normal limits  LACTIC ACID, PLASMA - Abnormal; Notable for the following:    Lactic Acid, Venous 2.9 (*)    All other components within normal limits  PRO B NATRIURETIC PEPTIDE - Abnormal; Notable for the following:    Pro B Natriuretic peptide (BNP) 28328.0 (*)    All other components within normal limits  APTT  I-STAT  TROPOININ, ED  TYPE AND SCREEN    Imaging Review Dg Chest Portable 1 View  12/22/2013   CLINICAL DATA:  Rectal bleeding and weakness.  EXAM: PORTABLE CHEST - 1 VIEW  COMPARISON:  10/14/2013  FINDINGS: Pacer/AICD device.  Leads at right atrium and right ventricle.  Midline trachea. Moderate cardiomegaly with a mildly tortuous thoracic aorta. No pleural effusion or pneumothorax. No congestive failure. No lobar consolidation.  IMPRESSION: Cardiomegaly without congestive failure.   Electronically Signed   By: Abigail Miyamoto M.D.   On: 12/22/2013 17:51     EKG Interpretation   Date/Time:  Saturday December 22 2013 19:25:33 EDT Ventricular Rate:  107 PR Interval:    QRS Duration: 112 QT Interval:  398 QTC Calculation: 531 R Axis:   -128 Text Interpretation:  Atrial fibrillation Probable lateral infarct, age  indeterminate Baseline wander in lead(s) V1 V2 otherwise similar to prior  ECG Confirmed by Isaac Dubie  MD-J, Janiaya Ryser ([id number]) on 12/22/2013 7:32:03 PM      MDM   Final diagnoses:  Gastrointestinal hemorrhage, unspecified gastritis, unspecified gastrointestinal hemorrhage type  Hypotension, unspecified hypotension type  Ischemic cardiomyopathy  Chronic systolic heart failure   Patient presented to the emergency room with gastrointestinal bleeding. He does have a history of GI bleed in the past. His stool is guaiac positive however fortunately the patient is not anemic and there was not any significant visible amount of blood in stool. He has not had bloody stools in the emergency department  The patient has remained persistently hypotensive. The family states that at best his blood pressures running in the 90s. I think his hypotension is related to his severe congestive heart failure. There does not seem to be an acute exacerbation at this time. I doubt sepsis or cardiac ischemia or hemorrhagic shock as the source of his hypotension at this time. The patient be admitted to the step down unit for  close observation.  I personally performed the services described in this documentation, which was scribed in my presence.  The recorded information has been reviewed and is accurate.     Dorie Rank, MD 12/22/13 3382  Dorie Rank, MD 12/22/13 [phone]

## 2013-12-22 NOTE — Plan of Care (Signed)
Problem: Consults Goal: GI Bleeding Patient Education See Patient Education Module for education specifics. Outcome: Progressing Admitted with GI bleeding noted the last few days, none noted on admission Goal: Nutrition Consult-if indicated Outcome: Progressing Poor appetite, for last few weeks due to thrush from antibiotic use, only sore today  Problem: Phase I Progression Outcomes Goal: Pain controlled with appropriate interventions Outcome: Not Applicable Date Met:  96/78/93 No complaints of pain Goal: Hemodynamically stable Outcome: Progressing bp is low, but is normally low for this patient, asymptomatic

## 2013-12-23 DIAGNOSIS — T18108A Unspecified foreign body in esophagus causing other injury, initial encounter: Secondary | ICD-10-CM

## 2013-12-23 DIAGNOSIS — K922 Gastrointestinal hemorrhage, unspecified: Secondary | ICD-10-CM

## 2013-12-23 DIAGNOSIS — K921 Melena: Principal | ICD-10-CM

## 2013-12-23 LAB — COMPREHENSIVE METABOLIC PANEL
ALT: 31 U/L (ref 0–53)
ANION GAP: 9 (ref 5–15)
AST: 31 U/L (ref 0–37)
Albumin: 2.8 g/dL — ABNORMAL LOW (ref 3.5–5.2)
Alkaline Phosphatase: 161 U/L — ABNORMAL HIGH (ref 39–117)
BILIRUBIN TOTAL: 2 mg/dL — AB (ref 0.3–1.2)
BUN: 45 mg/dL — AB (ref 6–23)
CHLORIDE: 105 meq/L (ref 96–112)
CO2: 27 meq/L (ref 19–32)
CREATININE: 1.46 mg/dL — AB (ref 0.50–1.35)
Calcium: 8 mg/dL — ABNORMAL LOW (ref 8.4–10.5)
GFR calc Af Amer: 49 mL/min — ABNORMAL LOW (ref >90)
GFR, EST NON AFRICAN AMERICAN: 43 mL/min — AB (ref >90)
GLUCOSE: 99 mg/dL (ref 70–99)
Potassium: 4 mEq/L (ref 3.7–5.3)
Sodium: 141 mEq/L (ref 137–147)
Total Protein: 5.2 g/dL — ABNORMAL LOW (ref 6.0–8.3)

## 2013-12-23 LAB — CBC
HCT: 37.5 % — ABNORMAL LOW (ref 39.0–52.0)
Hemoglobin: 12.7 g/dL — ABNORMAL LOW (ref 13.0–17.0)
MCH: 36.1 pg — ABNORMAL HIGH (ref 26.0–34.0)
MCHC: 33.9 g/dL (ref 30.0–36.0)
MCV: 106.5 fL — ABNORMAL HIGH (ref 78.0–100.0)
PLATELETS: 96 10*3/uL — AB (ref 150–400)
RBC: 3.52 MIL/uL — ABNORMAL LOW (ref 4.22–5.81)
RDW: 16.1 % — AB (ref 11.5–15.5)
WBC: 5.8 10*3/uL (ref 4.0–10.5)

## 2013-12-23 LAB — PROTIME-INR
INR: 1.33 (ref 0.00–1.49)
Prothrombin Time: 16.5 seconds — ABNORMAL HIGH (ref 11.6–15.2)

## 2013-12-23 MED ORDER — HYDROCORTISONE 2.5 % RE CREA
TOPICAL_CREAM | Freq: Three times a day (TID) | RECTAL | Status: DC
  Administered 2013-12-23: 1 via RECTAL
  Administered 2013-12-23 – 2013-12-24 (×2): via RECTAL
  Filled 2013-12-23: qty 28.35

## 2013-12-23 MED ORDER — ALPRAZOLAM 0.5 MG PO TABS
0.5000 mg | ORAL_TABLET | Freq: Once | ORAL | Status: AC
  Administered 2013-12-23: 0.5 mg via ORAL
  Filled 2013-12-23: qty 1

## 2013-12-23 MED ORDER — PANTOPRAZOLE SODIUM 40 MG IV SOLR
40.0000 mg | Freq: Two times a day (BID) | INTRAVENOUS | Status: DC
Start: 2013-12-23 — End: 2013-12-24
  Administered 2013-12-23 – 2013-12-24 (×3): 40 mg via INTRAVENOUS
  Filled 2013-12-23 (×3): qty 40

## 2013-12-23 NOTE — Progress Notes (Signed)
Patient with AICD/Dual chamber St. Jude Pacer 2009, upper left CW, Occasional single spikes noted,but no consistency.  Alarms continually sounding due to Multiple multifocal PVC's warranting VTach alarms.  HR 99-107.  BP remains hypotensive 57'D systolic.  Patient is asymptomatic.  Last EF 10-15% in March of 2015. Patient and spouse agree that patient has never felt the AICD go off. Notified MD due to concern that mechanics not working correctly.

## 2013-12-23 NOTE — Discharge Instructions (Signed)
EGD Discharge instructions Please read the instructions outlined below and refer to this sheet in the next few weeks. These discharge instructions provide you with general information on caring for yourself after you leave the hospital. Your doctor may also give you specific instructions. While your treatment has been planned according to the most current medical practices available, unavoidable complications occasionally occur. If you have any problems or questions after discharge, please call your doctor. ACTIVITY You may resume your regular activity but move at a slower pace for the next 24 hours.  Take frequent rest periods for the next 24 hours.  Walking will help expel (get rid of) the air and reduce the bloated feeling in your abdomen.  No driving for 24 hours (because of the anesthesia (medicine) used during the test).  You may shower.  Do not sign any important legal documents or operate any machinery for 24 hours (because of the anesthesia used during the test).  NUTRITION Drink plenty of fluids.  You may resume your normal diet.  Begin with a light meal and progress to your normal diet.  Avoid alcoholic beverages for 24 hours or as instructed by your caregiver.  MEDICATIONS You may resume your normal medications unless your caregiver tells you otherwise.  WHAT YOU CAN EXPECT TODAY You may experience abdominal discomfort such as a feeling of fullness or "gas" pains.  FOLLOW-UP Your doctor will discuss the results of your test with you.  SEEK IMMEDIATE MEDICAL ATTENTION IF ANY OF THE FOLLOWING OCCUR: Excessive nausea (feeling sick to your stomach) and/or vomiting.  Severe abdominal pain and distention (swelling).  Trouble swallowing.  Temperature over 101 F (37.8 C).  Rectal bleeding or vomiting of blood.      

## 2013-12-23 NOTE — Progress Notes (Signed)
Subjective: Mario Cline was admitted yesterday after presenting with bright red rectal bleeding. He had a small amount of bleeding on Thursday and then more yesterday. He denies any melena. He has not had abdominal pain or rectal pain. He has not vomited. He was found to have portal gastropathy and bulbar erosions on upper endoscopy in June. Anticoagulation was discontinued then. He had a given his capsule study without a bleeding lesion identified. Is not on any anticoagulants now. He denies any bleeding overnight. Objective: Vital signs in last 24 hours: Filed Vitals:   12/23/13 0600 12/23/13 0700 12/23/13 0737 12/23/13 0800  BP: 62/49 87/64  89/56  Pulse: 46 105  112  Temp:   97.9 F (36.6 C)   TempSrc:   Oral   Resp: 18 15  16   Height:      Weight:      SpO2: 92% 100%  99%   Weight change:   Intake/Output Summary (Last 24 hours) at 12/23/13 0936 Last data filed at 12/23/13 0600  Gross per 24 hour  Intake  917.5 ml  Output    725 ml  Net  192.5 ml    Physical Exam: Alert. No distress. He appears chronically ill and weak. Lungs clear. Heart irregularly irregular at 105 beats per minute. Abdomen is soft and nontender with no palpable organomegaly. Extremities reveal no significant edema.  Lab Results:    Results for orders placed during the hospital encounter of 12/22/13 (from the past 24 hour(s))  CBC WITH DIFFERENTIAL     Status: Abnormal   Collection Time    12/22/13  5:18 PM      Result Value Ref Range   WBC 4.9  4.0 - 10.5 K/uL   RBC 3.76 (*) 4.22 - 5.81 MIL/uL   Hemoglobin 13.5  13.0 - 17.0 g/dL   HCT 40.0  39.0 - 52.0 %   MCV 106.4 (*) 78.0 - 100.0 fL   MCH 35.9 (*) 26.0 - 34.0 pg   MCHC 33.8  30.0 - 36.0 g/dL   RDW 16.7 (*) 11.5 - 15.5 %   Platelets 95 (*) 150 - 400 K/uL   Neutrophils Relative % 56  43 - 77 %   Neutro Abs 2.8  1.7 - 7.7 K/uL   Lymphocytes Relative 35  12 - 46 %   Lymphs Abs 1.7  0.7 - 4.0 K/uL   Monocytes Relative 6  3 - 12 %   Monocytes Absolute  0.3  0.1 - 1.0 K/uL   Eosinophils Relative 2  0 - 5 %   Eosinophils Absolute 0.1  0.0 - 0.7 K/uL   Basophils Relative 0  0 - 1 %   Basophils Absolute 0.0  0.0 - 0.1 K/uL   Smear Review SPECIMEN CHECKED FOR CLOTS    COMPREHENSIVE METABOLIC PANEL     Status: Abnormal   Collection Time    12/22/13  5:18 PM      Result Value Ref Range   Sodium 140  137 - 147 mEq/L   Potassium 4.1  3.7 - 5.3 mEq/L   Chloride 103  96 - 112 mEq/L   CO2 25  19 - 32 mEq/L   Glucose, Bld 105 (*) 70 - 99 mg/dL   BUN 49 (*) 6 - 23 mg/dL   Creatinine, Ser 1.58 (*) 0.50 - 1.35 mg/dL   Calcium 8.2 (*) 8.4 - 10.5 mg/dL   Total Protein 5.1 (*) 6.0 - 8.3 g/dL   Albumin 2.8 (*) 3.5 - 5.2  g/dL   AST 36  0 - 37 U/L   ALT 33  0 - 53 U/L   Alkaline Phosphatase 168 (*) 39 - 117 U/L   Total Bilirubin 1.9 (*) 0.3 - 1.2 mg/dL   GFR calc non Af Amer 39 (*) >90 mL/min   GFR calc Af Amer 45 (*) >90 mL/min   Anion gap 12  5 - 15  APTT     Status: None   Collection Time    12/22/13  5:18 PM      Result Value Ref Range   aPTT 35  24 - 37 seconds  PROTIME-INR     Status: Abnormal   Collection Time    12/22/13  5:18 PM      Result Value Ref Range   Prothrombin Time 17.3 (*) 11.6 - 15.2 seconds   INR 1.41  0.00 - 1.49  TYPE AND SCREEN     Status: None   Collection Time    12/22/13  5:18 PM      Result Value Ref Range   ABO/RH(D) O POS     Antibody Screen NEG     Sample Expiration 12/25/2013    LACTIC ACID, PLASMA     Status: Abnormal   Collection Time    12/22/13  5:18 PM      Result Value Ref Range   Lactic Acid, Venous 2.9 (*) 0.5 - 2.2 mmol/L  PRO B NATRIURETIC PEPTIDE     Status: Abnormal   Collection Time    12/22/13  5:18 PM      Result Value Ref Range   Pro B Natriuretic peptide (BNP) 28328.0 (*) 0 - 450 pg/mL  I-STAT TROPOININ, ED     Status: None   Collection Time    12/22/13  5:41 PM      Result Value Ref Range   Troponin i, poc 0.01  0.00 - 0.08 ng/mL   Comment 3           MRSA PCR SCREENING      Status: None   Collection Time    12/22/13  8:36 PM      Result Value Ref Range   MRSA by PCR NEGATIVE  NEGATIVE  COMPREHENSIVE METABOLIC PANEL     Status: Abnormal   Collection Time    12/23/13  4:53 AM      Result Value Ref Range   Sodium 141  137 - 147 mEq/L   Potassium 4.0  3.7 - 5.3 mEq/L   Chloride 105  96 - 112 mEq/L   CO2 27  19 - 32 mEq/L   Glucose, Bld 99  70 - 99 mg/dL   BUN 45 (*) 6 - 23 mg/dL   Creatinine, Ser 1.46 (*) 0.50 - 1.35 mg/dL   Calcium 8.0 (*) 8.4 - 10.5 mg/dL   Total Protein 5.2 (*) 6.0 - 8.3 g/dL   Albumin 2.8 (*) 3.5 - 5.2 g/dL   AST 31  0 - 37 U/L   ALT 31  0 - 53 U/L   Alkaline Phosphatase 161 (*) 39 - 117 U/L   Total Bilirubin 2.0 (*) 0.3 - 1.2 mg/dL   GFR calc non Af Amer 43 (*) >90 mL/min   GFR calc Af Amer 49 (*) >90 mL/min   Anion gap 9  5 - 15  CBC     Status: Abnormal   Collection Time    12/23/13  4:53 AM      Result Value Ref Range  WBC 5.8  4.0 - 10.5 K/uL   RBC 3.52 (*) 4.22 - 5.81 MIL/uL   Hemoglobin 12.7 (*) 13.0 - 17.0 g/dL   HCT 37.5 (*) 39.0 - 52.0 %   MCV 106.5 (*) 78.0 - 100.0 fL   MCH 36.1 (*) 26.0 - 34.0 pg   MCHC 33.9  30.0 - 36.0 g/dL   RDW 16.1 (*) 11.5 - 15.5 %   Platelets 96 (*) 150 - 400 K/uL  PROTIME-INR     Status: Abnormal   Collection Time    12/23/13  4:53 AM      Result Value Ref Range   Prothrombin Time 16.5 (*) 11.6 - 15.2 seconds   INR 1.33  0.00 - 1.49     ABGS No results found for this basename: PHART, PCO2, PO2ART, TCO2, HCO3,  in the last 72 hours CULTURES Recent Results (from the past 240 hour(s))  MRSA PCR SCREENING     Status: None   Collection Time    12/22/13  8:36 PM      Result Value Ref Range Status   MRSA by PCR NEGATIVE  NEGATIVE Final   Comment:            The GeneXpert MRSA Assay (FDA     approved for NASAL specimens     only), is one component of a     comprehensive MRSA colonization     surveillance program. It is not     intended to diagnose MRSA     infection nor to  guide or     monitor treatment for     MRSA infections.   Studies/Results: Dg Chest Portable 1 View  12/22/2013   CLINICAL DATA:  Rectal bleeding and weakness.  EXAM: PORTABLE CHEST - 1 VIEW  COMPARISON:  10/14/2013  FINDINGS: Pacer/AICD device.  Leads at right atrium and right ventricle.  Midline trachea. Moderate cardiomegaly with a mildly tortuous thoracic aorta. No pleural effusion or pneumothorax. No congestive failure. No lobar consolidation.  IMPRESSION: Cardiomegaly without congestive failure.   Electronically Signed   By: Abigail Miyamoto M.D.   On: 12/22/2013 17:51   Micro Results: Recent Results (from the past 240 hour(s))  MRSA PCR SCREENING     Status: None   Collection Time    12/22/13  8:36 PM      Result Value Ref Range Status   MRSA by PCR NEGATIVE  NEGATIVE Final   Comment:            The GeneXpert MRSA Assay (FDA     approved for NASAL specimens     only), is one component of a     comprehensive MRSA colonization     surveillance program. It is not     intended to diagnose MRSA     infection nor to guide or     monitor treatment for     MRSA infections.   Studies/Results: Dg Chest Portable 1 View  12/22/2013   CLINICAL DATA:  Rectal bleeding and weakness.  EXAM: PORTABLE CHEST - 1 VIEW  COMPARISON:  10/14/2013  FINDINGS: Pacer/AICD device.  Leads at right atrium and right ventricle.  Midline trachea. Moderate cardiomegaly with a mildly tortuous thoracic aorta. No pleural effusion or pneumothorax. No congestive failure. No lobar consolidation.  IMPRESSION: Cardiomegaly without congestive failure.   Electronically Signed   By: Abigail Miyamoto M.D.   On: 12/22/2013 17:51   Medications:  I have reviewed the patient's current medications Scheduled Meds: . allopurinol  100 mg Oral BID  . carvedilol  3.125 mg Oral BID WC  . ketoconazole  1 application Topical BID  . nystatin  5 mL Oral QID  . pantoprazole (PROTONIX) IV  40 mg Intravenous Q12H  . potassium chloride SA  20 mEq  Oral TID  . sodium chloride  3 mL Intravenous Q12H  . torsemide  40 mg Oral BID   Continuous Infusions:  PRN Meds:.ondansetron (ZOFRAN) IV, ondansetron   Assessment/Plan: #1. Lower GI bleed. Hemoglobin is dropped from 13.5-12.7. He gives a history of previous hemorrhoids. Consult gastroenterology. Protonix will be initiated. #2. Chronic systolic heart failure. Continue torsemide and carvedilol. There has been recent discussion about right heart catheterization and treatment with milrinone. #3. Chronic kidney disease. Creatinine stable in the 1.5 range. #4. Status post AICD placement. #5. Recent thrush. Continue nystatin. Oropharynx reveals no sign of thrush now. #6. Tinea corporis. Continue ketoconazole topically. #7. Atrial fibrillation. Active Problems:   HYPERTENSION, MILD   Nonischemic cardiomyopathy   Chronic systolic heart failure   Automatic implantable cardioverter-defibrillator in situ   GI bleed   GI bleeding     LOS: 1 day   Naleyah Ohlinger 12/23/2013, 9:36 AM

## 2013-12-23 NOTE — Consult Note (Signed)
Referring Provider: No ref. provider found Primary Care Physician:  Asencion Noble, MD Primary Gastroenterologist:  Dr. 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 Patient report multiple episodes of small-volume gross blood per rectum yesterday. He had no bleeding the day before but the day before that, he had a small amount of bleeding.  Since being admitted to the ICU at about 1630 yesterday, he's had no bowel movement or further bleeding. Modest drop in hemoglobin overnight (13.5-12.7).  Has not had any melena recently. Denies nausea, vomiting odynophagia, dysphagia, early satiety. Stools reported to be a little loose recently but no overt diarrhea. He denies straining or constipation. Last colonoscopy done 5 years ago at [facility] by Dr. Laural Golden -reportedly negative for acute findings.. Recent admission in June - had melena. EGD and incomplete capsule study demonstrated some mild inflammation in his proximal GI tract, portal gastropathy but no ulcer or varices- H. pylori serologies were negative. He was not felt to be fit for a colonoscopy that time.      Past Medical History  Diagnosis Date  . Nonischemic cardiomyopathy     Nonobstructive CAD 1997, LVEF 10-15% March 2015  . Essential hypertension, benign   . Orthostatic hypotension   . Obstructive sleep apnea 2002    CPAP prescribed  . DJD (degenerative joint disease)   . AICD (automatic cardioverter/defibrillator) present 06/2007    AICD/dual-chamber pacemaker - (St. Jude)- 2/09;   . Atrial fibrillation 04/2009    Rare brief episodes by ICD interrogation until 03/2011->persistent  . Chronic anticoagulation   . Chronic kidney disease, stage III  (moderate)     Cardiorenal disease.    Past Surgical History  Procedure Laterality Date  . Tonsillectomy    . Cholecystectomy    . Posterior laminectomy / decompression lumbar spine  1985    +fusion; Dr. Pearlie Oyster  . Cardiac defibrillator placement  06/2007  . Esophagogastroduodenoscopy N/A 10/15/2013    Procedure: ESOPHAGOGASTRODUODENOSCOPY (EGD);  Surgeon: Rogene Houston, MD;  Location: [facility];  Service: Endoscopy;  Laterality: N/A;  . Givens capsule study N/A 10/16/2013    Procedure: GIVENS CAPSULE STUDY;  Surgeon: Rogene Houston, MD;  Location: [facility];  Service: Endoscopy;  Laterality: N/A;    Prior to Admission medications   Medication Sig Start Date End Date Taking? Authorizing Provider  allopurinol (ZYLOPRIM) 100 MG tablet Take 100 mg by mouth 2 (two) times daily. 06/07/13  Yes Jolaine Artist, MD  carvedilol (COREG) 3.125 MG tablet Take 1 tablet (3.125 mg total) by mouth 2 (two) times daily with a meal. 11/19/13  Yes Herminio Commons, MD  ketoconazole (NIZORAL) 2 % cream Apply 1 application topically 2 (two) times daily.   Yes Historical Provider, MD  nystatin (MYCOSTATIN) 100000 UNIT/ML suspension Take 5 mLs by mouth 4 (four) times daily.   Yes Historical Provider, MD  potassium chloride SA (K-DUR,KLOR-CON) 20 MEQ tablet Take 1 tablet (20 mEq total) by mouth 3 (three) times daily. 10/17/13  Yes Asencion Noble, MD  torsemide (DEMADEX) 20 MG tablet Take 40 mg by mouth 2 (two) times daily. 02/06/13  Yes Asencion Noble, MD  metolazone (ZAROXOLYN) 2.5 MG tablet Take 2.5 mg by mouth. Take if wt greater than or equal to 168 lb 07/09/13  Conrad [place], NP    Current Facility-Administered Medications  Medication Dose Route Frequency Provider Last Rate Last Dose  . allopurinol (ZYLOPRIM) tablet 100 mg  100 mg Oral BID Doree Albee, MD   100 mg at 12/23/13 0935  . carvedilol (COREG) tablet 3.125 mg  3.125 mg Oral BID WC Nimish C Anastasio Champion, MD   3.125 mg at 12/23/13 0809  .  ketoconazole (NIZORAL) 2 % cream 1 application  1 application Topical BID Doree Albee, MD   1 application at 73/53/29 0935  . nystatin (MYCOSTATIN) 100000 UNIT/ML suspension 500,000 Units  5 mL Oral QID Doree Albee, MD   500,000 Units at 12/23/13 0935  . ondansetron (ZOFRAN) tablet 4 mg  4 mg Oral Q6H PRN Nimish C Gosrani, MD       Or  . ondansetron (ZOFRAN) injection 4 mg  4 mg Intravenous Q6H PRN Nimish C Gosrani, MD      . pantoprazole (PROTONIX) injection 40 mg  40 mg Intravenous Q12H Asencion Noble, MD   40 mg at 12/23/13 0935  . potassium chloride SA (K-DUR,KLOR-CON) CR tablet 20 mEq  20 mEq Oral TID Doree Albee, MD   20 mEq at 12/23/13 0935  . sodium chloride 0.9 % injection 3 mL  3 mL Intravenous Q12H Nimish C Anastasio Champion, MD   3 mL at 12/23/13 0935  . torsemide (DEMADEX) tablet 40 mg  40 mg Oral BID Doree Albee, MD   40 mg at 12/23/13 0935    Allergies as of 12/22/2013 - Review Complete 12/22/2013  Allergen Reaction Noted  . Avelox [moxifloxacin hcl in nacl] Other (See Comments) 12/22/2013    Family History  Problem Relation Age of Onset  . Heart failure Mother   . Heart attack Father   . Cancer Brother     x2    History   Social History  . Marital Status: Married    Spouse Name: N/A    Number of Children: 2  . Years of Education: N/A   Occupational History  . Retired     Insurance account manager   Social History Main Topics  . Smoking status: Never Smoker   . Smokeless tobacco: Never Used  . Alcohol Use: 0.5 oz/week    1 drink(s) per week  . Drug Use: No  . Sexual Activity: No   Other Topics Concern  . Not on file   Social History Narrative  . No narrative on file    Review of Systems: Gen: Denies any fever, chills, sweats,  GI: Denies vomiting blood, jaundice, and fecal incontinence.   Denies dysphagia or odynophagia.  Physical Exam: Vital signs in last 24 hours: Temp:  [97.8 F (36.6 C)-98.4 F (36.9 C)] 97.9 F (36.6 C)  (08/16 0737) Pulse Rate:  [27-112] 112 (08/16 0800) Resp:  [12-23] 16 (08/16 0800) BP: (62-97)/(32-71) 89/56 mmHg (08/16 0800) SpO2:  [85 %-100 %] 99 % (08/16 0800) Weight:  [148 lb (67.132 kg)-151 lb 0.2 oz (68.5 kg)] 151 lb 0.2 oz (68.5 kg) (08/15 2052) Last BM Date: 12/22/13 General:   Alert,  pleasant conversant in no acute distress. He is accompanied by his son and wife in ICU room 4:   Eyes:  Sclera clear, no icterus.   Conjunctiva pink. Ears:  Normal auditory acuity. Nose:  No deformity, discharge,  or lesions. Mouth:  No deformity or lesions, dentition normal. Neck:  Supple; no masses or thyromegaly. Heart:  Regular rate and rhythm; no  murmurs, clicks, rubs,  or gallops. Abdomen:  Nondistended. Positive bowel sounds. Soft and nontender without obvious mass or organomegaly Rectal:  2 small external hemorrhoid tags. Digital exam revealed some formed stool in rectal vault. No blood or clot recovered    Intake/Output from previous day: 08/15 0701 - 08/16 0700 In: 917.5 [I.V.:917.5] Out: 725 [Urine:725] Intake/Output this shift:    Lab Results:  Recent Labs  12/22/13 1718 12/23/13 0453  WBC 4.9 5.8  HGB 13.5 12.7*  HCT 40.0 37.5*  PLT 95* 96*   BMET  Recent Labs  12/22/13 1718 12/23/13 0453  NA 140 141  K 4.1 4.0  CL 103 105  CO2 25 27  GLUCOSE 105* 99  BUN 49* 45*  CREATININE 1.58* 1.46*  CALCIUM 8.2* 8.0*   LFT  Recent Labs  12/23/13 0453  PROT 5.2*  ALBUMIN 2.8*  AST 31  ALT 31  ALKPHOS 161*  BILITOT 2.0*   PT/INR  Recent Labs  12/22/13 1718 12/23/13 0453  LABPROT 17.3* 16.5*  INR 1.41 1.33  Studies/Results: Dg Chest Portable 1 View  12/22/2013   CLINICAL DATA:  Rectal bleeding and weakness.  EXAM: PORTABLE CHEST - 1 VIEW  COMPARISON:  10/14/2013  FINDINGS: Pacer/AICD device.  Leads at right atrium and right ventricle.  Midline trachea. Moderate cardiomegaly with a mildly tortuous thoracic aorta. No pleural effusion or pneumothorax. No  congestive failure. No lobar consolidation.  IMPRESSION: Cardiomegaly without congestive failure.   Electronically Signed   By: Abigail Miyamoto M.D.   On: 12/22/2013 17:51   Impression:  Pleasant 80 year old gentleman with end-stage cardiomyopathy and multiple comorbidities admitted to the hospital with low volume, painless hematochezia yesterday. He's had a modest drop in his hemoglobin. He remains hemodynamically stable in the ICU. Since admission to the ICU at about 1630 hours yesterday ,he has had no further bleeding. He does not appear in any distress whatsoever at this time. His BMP is nearly 30,000. I called and discussed case with Dr. Willey Blade today. He is felt to be optimized from a cardiac standpoint but, again, has end-stage disease. Chronic kidney disease and elevated LFTs which are likely due to left heart failure. He likely has a congestive hepatopathy/cardiac cirrhosis. No further evaluation of these issues is warranted. It is good to know he had a colonoscopy about 5 years ago without significant findings. His recent GI workup as outlined. I suspect he's experienced bleeding from benign anorectal source. Family favors a conservative approach. No further anticoagulation siomplifies his GI management.  Recommendations: I agree with a conservative approach. I do not recommend a colonoscopy. We could consider doing a flexible sigmoidoscopy if bleeding recurs. However, at this time I would favor observation and the empiric use of topical anti-inflammatory treatment to the anorectum. Patient wants to go home. For now, I recommend Anusol cream to the anorectum 3 times a day. Keep him on a clear liquid diet the remainder of today. If No further bleeding, diet can be advanced tomorrow morning. My impression and recommendations were discussed at length with the patient, the patient spouse and son.  I'd like to thank Dr. Willey Blade for allowing me to see this nice gentleman today.       Notice:  This  dictation was prepared with Dragon dictation along with smaller phrase technology. Any transcriptional errors that result from this process are unintentional and may not be corrected upon review.

## 2013-12-23 NOTE — Plan of Care (Signed)
Problem: Phase II Progression Outcomes Goal: No active bleeding Outcome: Progressing No bm since admission to floor Goal: Hemodynamically stable Outcome: Progressing BP remains hypotensive, which is the norm for this patient, Goal: Progress activity as tolerated unless otherwise ordered Outcome: Progressing Up to recliner several times today,  Goal: Tolerating diet Outcome: Progressing Clear liquids may advance in am per Dr. Gala Romney

## 2013-12-24 LAB — OCCULT BLOOD, POC DEVICE: Fecal Occult Bld: POSITIVE — AB

## 2013-12-24 MED ORDER — HYDROCORTISONE 2.5 % RE CREA: TOPICAL_CREAM | Freq: Three times a day (TID) | RECTAL | Status: DC

## 2013-12-24 NOTE — Discharge Summary (Signed)
Physician Discharge Summary  Mario Cline VVO:[id number] DOB: [birth year] DOA: 12/22/2013   Admit date: 12/22/2013 Discharge date: 12/24/2013  Discharge Diagnoses: Lower GI bleeding. Likely benign anorectal source. Active Problems:   HYPERTENSION, MILD   Nonischemic cardiomyopathy   Chronic systolic heart failure   Automatic implantable cardioverter-defibrillator in situ   GI bleed   GI bleeding    Wt Readings from Last 3 Encounters:  12/24/13 166 lb 0.1 oz (75.3 kg)  12/13/13 159 lb (72.122 kg)  10/30/13 160 lb (72.576 kg)     Hospital Course:  This patient is an 80 year old white male with end stage heart failure who presented with bright red rectal bleeding. He is not on anticoagulants. He denied any rectal pain or abdominal pain. He has a history of bulbar erosions on upper endoscopy earlier this summer. He had findings of portal gastropathy. He was seen in gastroenterology consultation by Dr. Sydell Axon. The decision has been made not to pursue further evaluation. He is felt to likely have bleeding from a benign anorectal source. His bleeding has stopped. His hemoglobin changed from 13.5-12.7.  He has been stable from a cardiovascular standpoint. He will continue torsemide and carvedilol. He and his wife are considering further cardiac evaluation for possible treatment with milrinone.  He we treated with Proctozone  cream 3 times a day. He'll followup in my office in one week.   Discharge Instructions     Medication List         allopurinol 100 MG tablet  Commonly known as:  ZYLOPRIM  Take 100 mg by mouth 2 (two) times daily.     carvedilol 3.125 MG tablet  Commonly known as:  COREG  Take 1 tablet (3.125 mg total) by mouth 2 (two) times daily with a meal.     hydrocortisone 2.5 % rectal cream  Commonly known as:  ANUSOL-HC  Place rectally 3 (three) times daily.     ketoconazole 2 % cream  Commonly known as:  NIZORAL  Apply 1 application topically 2 (two) times daily.      metolazone 2.5 MG tablet  Commonly known as:  ZAROXOLYN  Take 2.5 mg by mouth. Take if wt greater than or equal to 168 lb     nystatin 100000 UNIT/ML suspension  Commonly known as:  MYCOSTATIN  Take 5 mLs by mouth 4 (four) times daily.     potassium chloride SA 20 MEQ tablet  Commonly known as:  K-DUR,KLOR-CON  Take 1 tablet (20 mEq total) by mouth 3 (three) times daily.     torsemide 20 MG tablet  Commonly known as:  DEMADEX  Take 40 mg by mouth 2 (two) times daily.         [name] 12/24/2013

## 2013-12-24 NOTE — Care Management Utilization Note (Signed)
UR completed 

## 2013-12-25 ENCOUNTER — Telehealth (INDEPENDENT_AMBULATORY_CARE_PROVIDER_SITE_OTHER): Payer: Self-pay | Admitting: Internal Medicine

## 2013-12-25 DIAGNOSIS — K625 Hemorrhage of anus and rectum: Secondary | ICD-10-CM

## 2013-12-25 NOTE — Telephone Encounter (Signed)
Spoke with wife. Had episode of bleeding yesterday evening x 2. She has spoken with Dr. Gala Romney. I am going to get CBC just to be sure he has dropped his hemoglobin. If another episode of rectal bleeding occurs, she will call Dr. Gala Romney.

## 2013-12-26 ENCOUNTER — Telehealth (INDEPENDENT_AMBULATORY_CARE_PROVIDER_SITE_OTHER): Payer: Self-pay | Admitting: *Deleted

## 2013-12-26 ENCOUNTER — Telehealth: Payer: Self-pay | Admitting: Internal Medicine

## 2013-12-26 LAB — CBC
HCT: 40.3 % (ref 39.0–52.0)
HEMOGLOBIN: 13.4 g/dL (ref 13.0–17.0)
MCH: 35.1 pg — AB (ref 26.0–34.0)
MCHC: 33.3 g/dL (ref 30.0–36.0)
MCV: 105.5 fL — ABNORMAL HIGH (ref 78.0–100.0)
PLATELETS: 126 10*3/uL — AB (ref 150–400)
RBC: 3.82 MIL/uL — AB (ref 4.22–5.81)
RDW: 16.5 % — ABNORMAL HIGH (ref 11.5–15.5)
WBC: 6.1 10*3/uL (ref 4.0–10.5)

## 2013-12-26 MED ORDER — HYDROCORTISONE ACETATE 25 MG RE SUPP: 25.0000 mg | Freq: Two times a day (BID) | RECTAL | Status: DC

## 2013-12-26 NOTE — Telephone Encounter (Signed)
Mario Cline called and started talking as soon as I picked up about her husband seen RMR in the hospital Sunday because he was bleeding right much and was told to keep a check on it for 48 hours and then said something about NUR's NP recommended something and I couldn't make out a lot of what she was talking about. Then she asked for RMR to call her. I told her RMR was off today and I was going to transfer her call to his nurse and for her to repeat everything that she just told me. I accidentally forwarded call to CM voicemail and she is aware. Please call wife back at [phone]. She is VERY anxious

## 2013-12-26 NOTE — Telephone Encounter (Addendum)
Called patient'S SON TO DISCUSS CONCERNS. ASKED ME TO CALL PT'S WIFE. GIVEN CREAM BUT NOT SUPP. NEVER HAD [place]. EATING A REG DIET WHEN HE GOT HOME AND THEN SAW A LOT OF BLOOD MON. TUES NO BM. TODAY HAD A BM AND BLOOD WITH HIS BM.   CALLED PT'S WIFE. WANTS TO KNOW IF SUPP MAY HELP MORE THAN THE CREAM. RX FOR ANUSOL SUPP SENT. PT WILL CONTACT NUR MON. PT MAY NEED FLEX SIG AND WOULD CONSIDER HEMORRHOID BANDING  AS A OPTION TO TREAT HEMORRHOIDAL BLEEDING. EXPLAINED TO WIFE BLEEDING SHOULD IMPROVE OVER THE NEXT 2-3 DAYS. CALL WITH QUESTIONS OR CONCERNS.  DISCUSSED CBC RESULTS WITH SON AND WIFE.

## 2013-12-26 NOTE — Telephone Encounter (Signed)
Dr.Fields spoke with pts wife and son.

## 2013-12-26 NOTE — Telephone Encounter (Signed)
I spoke with the pts son- Mario Cline- pt has had 2-3 episodes of bright red blood with wiping and in his stool. They have called Dr.Rehman's office and were told to call here. Pt is a NUR pt.He is wanting to know if pt needs flex-sig and wants to know if it can be done today. Pt is not feeling well but with the CHF and other medical problems the pt has they cannot tell what is making him feel bad. Also pt was given anusol cream at the hospital, which they are still using but he wonders if suppositories would work better. I went over the recent blood work with him. I advised him that I would speak with Vicente Males and call him back.

## 2013-12-26 NOTE — Telephone Encounter (Signed)
He has end-stage cardiomyopathy. At the time of admission, the family favored a conservative approach. A flex sig could be considered in the future, but this is not urgent as his Hgb is stable and there is not persistent rectal bleeding. Needs to continue using the cream TID. I am routing this back to Terri and Dr. Laural Golden who can decide on feasibility or need for flex sig when he returns. Would favor avoiding an invasive procedure if at all possible. Orvil Feil, ANP-BC [facility]

## 2013-12-26 NOTE — Telephone Encounter (Signed)
I advised son to get in touch with who ever is on call for GI since he had the rectal bleeding today.

## 2013-12-26 NOTE — Telephone Encounter (Signed)
Routing to AS 

## 2013-12-26 NOTE — Addendum Note (Signed)
Addended by: Danie Binder on: 12/26/2013 01:54 PM   Modules accepted: Orders

## 2013-12-26 NOTE — Telephone Encounter (Signed)
Mario Cline, patient's sone, called and would like to speak with Mario Castle, NP. His father has started bleeding again and would like to get the lab results. His return phone number is [phone] or Darryll Capers can be reached at [phone].

## 2013-12-28 ENCOUNTER — Telehealth: Payer: Self-pay

## 2013-12-28 DIAGNOSIS — K625 Hemorrhage of anus and rectum: Secondary | ICD-10-CM

## 2013-12-28 LAB — CBC
HEMATOCRIT: 36.2 % — AB (ref 39.0–52.0)
HEMOGLOBIN: 12 g/dL — AB (ref 13.0–17.0)
MCH: 35.6 pg — ABNORMAL HIGH (ref 26.0–34.0)
MCHC: 33.1 g/dL (ref 30.0–36.0)
MCV: 107.4 fL — AB (ref 78.0–100.0)
Platelets: 135 10*3/uL — ABNORMAL LOW (ref 150–400)
RBC: 3.37 MIL/uL — ABNORMAL LOW (ref 4.22–5.81)
RDW: 16.8 % — ABNORMAL HIGH (ref 11.5–15.5)
WBC: 6.2 10*3/uL (ref 4.0–10.5)

## 2013-12-28 NOTE — Telephone Encounter (Signed)
Pt's wife is calling today because he is still bleed. He is not in any pain.His wife is worried that his HGB might get to low over the weekend if this keep up. The medication is not working. Please advise

## 2013-12-28 NOTE — Telephone Encounter (Signed)
Let's recheck a CBC now.  If it is only with BMs and not profusely bleeding, then he is stable to wait over the weekend.  However, if he has persistent rectal bleeding, recommend evaluation.

## 2013-12-28 NOTE — Telephone Encounter (Signed)
I tried to call pt with no answer multiple times before we left . I left message on voice mail telling them everything AS said to do. To go have the blood work done and faxed over the order to the lab. I also advise them that if he had persistent rectal bleeding to go to the ER.

## 2013-12-29 NOTE — Telephone Encounter (Signed)
REVIEWED. AGREE. 

## 2013-12-31 ENCOUNTER — Ambulatory Visit (INDEPENDENT_AMBULATORY_CARE_PROVIDER_SITE_OTHER): Payer: Medicare Other | Admitting: Internal Medicine

## 2013-12-31 ENCOUNTER — Encounter (INDEPENDENT_AMBULATORY_CARE_PROVIDER_SITE_OTHER): Payer: Self-pay | Admitting: Internal Medicine

## 2013-12-31 ENCOUNTER — Other Ambulatory Visit (INDEPENDENT_AMBULATORY_CARE_PROVIDER_SITE_OTHER): Payer: Self-pay | Admitting: *Deleted

## 2013-12-31 ENCOUNTER — Encounter (INDEPENDENT_AMBULATORY_CARE_PROVIDER_SITE_OTHER): Payer: Self-pay | Admitting: *Deleted

## 2013-12-31 VITALS — BP 92/60 | HR 64 | Temp 93.4°F | Resp 16 | Ht 68.0 in | Wt 155.5 lb

## 2013-12-31 DIAGNOSIS — K625 Hemorrhage of anus and rectum: Secondary | ICD-10-CM

## 2013-12-31 NOTE — Patient Instructions (Signed)
Flexible sigmoidoscopy to be scheduled 

## 2013-12-31 NOTE — Telephone Encounter (Signed)
I talked with the patient's son Coner Gibbard on 12/28/2013 and again 12/29/2013 then reviewed results of CBC with him. Plan to see patient in the office this afternoon.

## 2013-12-31 NOTE — Progress Notes (Signed)
Presenting complaint;  Rectal bleeding.  Database;  Patient is 80 year old Caucasian male who has debilitating nonischemic cardiomyopathy who was hospitalized 2-1/2 months ago with acute GI bleed while anticoagulated. He underwent esophagogastroduodenoscopy revealing changes of portal gastropathy and bulbar erosions. No bleeding lesion was identified. H. pylori serology was negative. Patient felt he was not ready to be prepped for colonoscopy but agreed to undergo small bowel given capsule study. Given capsule and 4 she did not make of the cecum. He occasional edema but no bleeding lesions are identified. Patient decided not to go back on anticoagulant. He did fine until about 2 weeks ago when he developed rectal bleeding and was hospitalized from 12/22/2013 to 12/24/2013. He did not require blood transfusion as hemoglobin dropped by less than 1 g. Patient was seen by Dr. Gala Romney. It was felt that he may have bled from hemorrhoids. Since he stopped bleeding he was not further evaluated.  Subjective;  Patient is here for scheduled visit accompanied by his wife. Patient has been using Anusol-HC suppositories however he's been passing blood per rectum intermittently. Bleeding has occurred primarily with his bowel movements. He is passing small to moderate amount of fresh blood each time. He had 3 bowel movements on Saturday and he passed blood each time. He had 2 yesterday and one today and again he passed blood per rectum. He denies abdominal pain nausea or vomiting. He remains with poor appetite. His wife feels lower extremity edema has increased somewhat. Patient complains of extreme weakness. Until about 4 months ago he was able to walk from his home to the mailbox and back but not anymore. He can only walk a few steps in the house.   Current Medications: Outpatient Encounter Prescriptions as of 12/31/2013  Medication Sig  . allopurinol (ZYLOPRIM) 100 MG tablet Take 100 mg by mouth 2 (two) times  daily.  . carvedilol (COREG) 3.125 MG tablet Take 1 tablet (3.125 mg total) by mouth 2 (two) times daily with a meal.  . hydrocortisone (ANUSOL-HC) 25 MG suppository Place 1 suppository (25 mg total) rectally every 12 (twelve) hours. For 12 days  . ketoconazole (NIZORAL) 2 % cream Apply 1 application topically 2 (two) times daily.  . metolazone (ZAROXOLYN) 2.5 MG tablet Take 2.5 mg by mouth. Take if wt greater than or equal to 168 lb  . nystatin (MYCOSTATIN) 100000 UNIT/ML suspension Take 5 mLs by mouth 4 (four) times daily.  . potassium chloride SA (K-DUR,KLOR-CON) 20 MEQ tablet Take 1 tablet (20 mEq total) by mouth 3 (three) times daily.  Marland Kitchen torsemide (DEMADEX) 20 MG tablet Take 40 mg by mouth 2 (two) times daily.  . hydrocortisone (ANUSOL-HC) 2.5 % rectal cream Place rectally 3 (three) times daily.    Objective: Blood pressure 92/60, pulse 64, temperature 93.4 F (34.1 C), temperature source Oral, resp. rate 16, height 5\' 8"  (1.727 m), weight 155 lb 8 oz (70.534 kg). Patient is sitting in wheelchair. He appears chronically ill. He is not in respiratory distress. Conjunctiva is pink. Sclera is nonicteric Oropharyngeal mucosa is normal. No neck masses or thyromegaly noted. Cardiac exam with regular rhythm normal S1, S2 and S4. Also has faint systolic ejection murmur at LLSB. Lungs are clear to auscultation. Abdomen is full but soft and nontender. Patient was examined in sitting position.  He has 2+ pitting edema involving both legs to the level of means. Focal discoloration noted to skin and left leg with seeping of fluid from the posterior aspect. No ulceration noted. His hands  are cold.  Labs/studies Results:  H&H 12.0 and 36.2 from 12/28/2013.  Assessment:  #1. Recurrent hematochezia. Evaluation has been delayed because of severe nonischemic cardiomyopathy and the fact that he is high risk for any intervention. Since he's been passing blood per rectum daily for over a week his family  is getting very anxious and agreeable to limited work up. #2. Mild anemia. Hemoglobin has dropped by about gram and a half and last 10 days.   Plan:  Flexible sigmoidoscopy in near future. Will repeat H&H at the time of procedure.

## 2013-12-31 NOTE — Telephone Encounter (Signed)
Already addressed

## 2014-01-01 ENCOUNTER — Encounter (HOSPITAL_COMMUNITY): Payer: Self-pay | Admitting: *Deleted

## 2014-01-01 ENCOUNTER — Encounter (HOSPITAL_COMMUNITY)
Admission: RE | Disposition: A | Discharge status: Home / Self Care | Payer: Self-pay | Source: Ambulatory Visit | Attending: Internal Medicine

## 2014-01-01 ENCOUNTER — Ambulatory Visit (HOSPITAL_COMMUNITY)
Admission: RE | Admit: 2014-01-01 | Discharge: 2014-01-01 | Disposition: A | Discharge status: Home / Self Care | Payer: Medicare Other | Source: Ambulatory Visit | Attending: Internal Medicine | Admitting: Internal Medicine

## 2014-01-01 DIAGNOSIS — N183 Chronic kidney disease, stage 3 unspecified: Secondary | ICD-10-CM | POA: Insufficient documentation

## 2014-01-01 DIAGNOSIS — K625 Hemorrhage of anus and rectum: Secondary | ICD-10-CM | POA: Insufficient documentation

## 2014-01-01 DIAGNOSIS — I428 Other cardiomyopathies: Secondary | ICD-10-CM | POA: Insufficient documentation

## 2014-01-01 DIAGNOSIS — K5730 Diverticulosis of large intestine without perforation or abscess without bleeding: Secondary | ICD-10-CM | POA: Insufficient documentation

## 2014-01-01 DIAGNOSIS — L919 Hypertrophic disorder of the skin, unspecified: Secondary | ICD-10-CM

## 2014-01-01 DIAGNOSIS — L909 Atrophic disorder of skin, unspecified: Secondary | ICD-10-CM | POA: Diagnosis not present

## 2014-01-01 DIAGNOSIS — I129 Hypertensive chronic kidney disease with stage 1 through stage 4 chronic kidney disease, or unspecified chronic kidney disease: Secondary | ICD-10-CM | POA: Diagnosis not present

## 2014-01-01 DIAGNOSIS — Z95810 Presence of automatic (implantable) cardiac defibrillator: Secondary | ICD-10-CM | POA: Diagnosis not present

## 2014-01-01 DIAGNOSIS — Z79899 Other long term (current) drug therapy: Secondary | ICD-10-CM | POA: Insufficient documentation

## 2014-01-01 DIAGNOSIS — K644 Residual hemorrhoidal skin tags: Secondary | ICD-10-CM

## 2014-01-01 HISTORY — PX: FLEXIBLE SIGMOIDOSCOPY: SHX5431

## 2014-01-01 LAB — HEMOGLOBIN AND HEMATOCRIT, BLOOD
HCT: 37.3 % — ABNORMAL LOW (ref 39.0–52.0)
HEMOGLOBIN: 12.5 g/dL — AB (ref 13.0–17.0)

## 2014-01-01 SURGERY — SIGMOIDOSCOPY, FLEXIBLE
Anesthesia: Moderate Sedation

## 2014-01-01 MED ORDER — LIDOCAINE HCL 2 % EX GEL
CUTANEOUS | Status: DC | PRN
  Administered 2014-01-01: 1 via TOPICAL

## 2014-01-01 MED ORDER — STERILE WATER FOR IRRIGATION IR SOLN
Status: DC | PRN
  Administered 2014-01-01: 13:00:00

## 2014-01-01 MED ORDER — SODIUM CHLORIDE 0.9 % IV SOLN
INTRAVENOUS | Status: DC
  Administered 2014-01-01: 11:00:00 via INTRAVENOUS

## 2014-01-01 MED ORDER — DOCUSATE SODIUM 100 MG PO CAPS: 100.0000 mg | ORAL_CAPSULE | Freq: Every day | ORAL | Status: DC

## 2014-01-01 MED ORDER — LIDOCAINE HCL 2 % EX GEL
CUTANEOUS | Status: AC
  Filled 2014-01-01: qty 60

## 2014-01-01 MED ORDER — FLEET ENEMA 7-19 GM/118ML RE ENEM
1.0000 | ENEMA | Freq: Once | RECTAL | Status: DC
  Filled 2014-01-01: qty 1

## 2014-01-01 MED ORDER — SODIUM CHLORIDE 0.9 % IV SOLN: INTRAVENOUS | Status: DC

## 2014-01-01 MED ORDER — PSYLLIUM 28 % PO PACK: 1.0000 | PACK | Freq: Every day | ORAL | Status: DC

## 2014-01-01 NOTE — H&P (Signed)
Mario Cline is an 80 y.o. male.   Chief Complaint: Patient here for flexible sigmoidoscopy. HPI: Patient is a 81-year-old Caucasian with multiple medical problems including severe nonischemic cardiomyopathy with EF of less than 15% who presents with recurrent rectal bleeding. He was partially evaluated in June 2015 while hospitalized. He was readmitted week before lost and bleeding stopped spontaneously. No recent passing bright red blood per rectum with each bowel movement. Tissue is seen in the office yesterday and wife and son wanted to undergo a limited evaluation well confined and fix the bleeding source. Please see my note from yesterday for additional details  Past Medical History  Diagnosis Date  . Nonischemic cardiomyopathy     Nonobstructive CAD 1997, LVEF 10-15% March 2015  . Essential hypertension, benign   . Orthostatic hypotension   . Obstructive sleep apnea 2002    CPAP prescribed  . DJD (degenerative joint disease)   . AICD (automatic cardioverter/defibrillator) present 06/2007    AICD/dual-chamber pacemaker - (St. Jude)- 2/09;   . Atrial fibrillation 04/2009    Rare brief episodes by ICD interrogation until 03/2011->persistent  . Chronic anticoagulation   . Chronic kidney disease, stage III (moderate)     Cardiorenal disease.    Past Surgical History  Procedure Laterality Date  . Tonsillectomy    . Cholecystectomy    . Posterior laminectomy / decompression lumbar spine  1985    +fusion; Dr. Pearlie Oyster  . Cardiac defibrillator placement  06/2007  . Esophagogastroduodenoscopy N/A 10/15/2013    Procedure: ESOPHAGOGASTRODUODENOSCOPY (EGD);  Surgeon: Rogene Houston, MD;  [facility];  Service: Endoscopy;  Laterality: N/A;  . Givens capsule study N/A 10/16/2013    Procedure: GIVENS CAPSULE STUDY;  Surgeon: Rogene Houston, MD;  [facility];  Service: Endoscopy;  Laterality: N/A;    Family History  Problem Relation Age of Onset  . Heart failure  Mother   . Heart attack Father   . Cancer Brother     x2   Social History:  reports that he has never smoked. He has never used smokeless tobacco. He reports that he drinks about .5 ounces of alcohol per week. He reports that he does not use illicit drugs.  Allergies:  Allergies  Allergen Reactions  . Avelox [Moxifloxacin Hcl In Nacl] Other (See Comments)    thrush    Medications Prior to Admission  Medication Sig Dispense Refill  . allopurinol (ZYLOPRIM) 100 MG tablet Take 100 mg by mouth 2 (two) times daily.      . carvedilol (COREG) 3.125 MG tablet Take 1 tablet (3.125 mg total) by mouth 2 (two) times daily with a meal.  60 tablet  3  . hydrocortisone (ANUSOL-HC) 2.5 % rectal cream Place rectally 3 (three) times daily.  30 g  0  . hydrocortisone (ANUSOL-HC) 25 MG suppository Place 1 suppository (25 mg total) rectally every 12 (twelve) hours. For 12 days  24 suppository  0  . ketoconazole (NIZORAL) 2 % cream Apply 1 application topically 2 (two) times daily.      Marland Kitchen nystatin (MYCOSTATIN) 100000 UNIT/ML suspension Take 5 mLs by mouth 4 (four) times daily.      . potassium chloride SA (K-DUR,KLOR-CON) 20 MEQ tablet Take 1 tablet (20 mEq total) by mouth 3 (three) times daily.  90 tablet  5  . torsemide (DEMADEX) 20 MG tablet Take 40 mg by mouth 2 (two) times daily.      . metolazone (ZAROXOLYN) 2.5 MG tablet  Take 2.5 mg by mouth. Take if wt greater than or equal to 168 lb        No results found for this or any previous visit (from the past 48 hour(s)). No results found.  ROS  Blood pressure 83/52, pulse 99, temperature 97.8 F (36.6 C), temperature source Oral, resp. rate 23, height 5\' 8"  (1.727 m), weight 155 lb (70.308 kg), SpO2 100.00%. Physical Exam  Constitutional:  Well-developed thin frail-appearing Caucasian male  HENT:  Mouth/Throat: Oropharynx is clear and moist.  Eyes: Conjunctivae are normal.  Neck: No thyromegaly present.  Cardiovascular: Normal rate and regular  rhythm.   Occasional irregularity normal S1 and S2. He has as full and systolic ejection murmur at aortic area and LLSB  Respiratory: Effort normal and breath sounds normal.  GI: Soft. He exhibits no distension and no mass.  Musculoskeletal: Edema: 2+ pitting edema involving both legs   Lymphadenopathy:    He has no cervical adenopathy.  Neurological:  Patient is drowsy but responds appropriately to questions  Skin: Skin is dry.     Assessment/Plan Recurrent rectal bleeding in a patient with severe nonischemic cardiomyopathy. Diagnostic flexible sigmoidoscopy.  REHMAN,NAJEEB U 01/01/2014, 12:53 PM

## 2014-01-01 NOTE — Discharge Instructions (Signed)
Resume usual medications and diet. Metamucil 4 g by mouth daily at bedtime. Colace 100 mg almost daily at bedtime. Continue Anusol-HC suppository or cream application anal canal daily for 2 weeks. Physician Will call with results of H&H  Colonoscopy, Care After Refer to this sheet in the next few weeks. These instructions provide you with information on caring for yourself after your procedure. Your health care provider may also give you more specific instructions. Your treatment has been planned according to current medical practices, but problems sometimes occur. Call your health care provider if you have any problems or questions after your procedure. WHAT TO EXPECT AFTER THE PROCEDURE  After your procedure, it is typical to have the following:  A small amount of blood in your stool.  Moderate amounts of gas and mild abdominal cramping or bloating. HOME CARE INSTRUCTIONS  Do not drive, operate machinery, or sign important documents for 24 hours.  You may shower and resume your regular physical activities, but move at a slower pace for the first 24 hours.  Take frequent rest periods for the first 24 hours.  Walk around or put a warm pack on your abdomen to help reduce abdominal cramping and bloating.  Drink enough fluids to keep your urine clear or pale yellow.  You may resume your normal diet as instructed by your health care provider. Avoid heavy or fried foods that are hard to digest.  Avoid drinking alcohol for 24 hours or as instructed by your health care provider.  Only take over-the-counter or prescription medicines as directed by your health care provider.  If a tissue sample (biopsy) was taken during your procedure:  Do not take aspirin or blood thinners for 7 days, or as instructed by your health care provider.  Do not drink alcohol for 7 days, or as instructed by your health care provider.  Eat soft foods for the first 24 hours. SEEK MEDICAL CARE IF: You have  persistent spotting of blood in your stool 2-3 days after the procedure. SEEK IMMEDIATE MEDICAL CARE IF:  You have more than a small spotting of blood in your stool.  You pass large blood clots in your stool.  Your abdomen is swollen (distended).  You have nausea or vomiting.  You have a fever.  You have increasing abdominal pain that is not relieved with medicine. Document Released: 12/09/2003 Document Revised: 02/14/2013 Document Reviewed: 01/01/2013 [facility] Patient Information 2015 [place], [place]. This information is not intended to replace advice given to you by your health care provider. Make sure you discuss any questions you have with your health care provider.

## 2014-01-01 NOTE — OR Nursing (Signed)
2 Fleets administered to patient with bloody liquid return. Patient tolerated enemas without difficulty.

## 2014-01-01 NOTE — Op Note (Signed)
FLEXIBLE SIGMOIDOSCOPY PROCEDURE REPORT  PATIENT:  Mario Cline  MR#:  [id number] Birthdate:  02-14-1929, 80 y.o., male Endoscopist:  Dr. Rogene Houston, MD Referred By:  Dr. Rayne Du ref. provider found Procedure Date: 01/01/2014  Procedure:  Flexible sigmoidoscopy.  Indications: Patient is a 80 year old Caucasian male with severe nonischemic cardiomyopathy with recurrent rectal bleeding.  Informed Consent:  The procedure and risks were reviewed with the patient and informed consent was obtained.  Medications:  2% Xylocaine jelly was applied to the anal canal. No IV medications used for conscious sedation.  Description of procedure:  After a digital rectal exam was performed, that colonoscope was advanced from the anus through the rectum and end descending colon.as the scope was slowly withdrawn mucosal surfaces were carefully surveyed utilizing scope tip to flexion to facilitate fold flattening as needed. The scope was pulled down into the rectum where a thorough exam including retroflexion was performed.  Findings:  Prep satisfactory. Formed but soft stool noted scattered throughout the sigmoid and descending colon. Multiple diverticula noted at sigmoid colon. Normal rectal mucosa with 2 small free floating clots. Hemorrhoids noted below the dentate line and one covered with dried blood or scab. Large soft anal skin tag noted posteriorly.    Therapeutic/Diagnostic Maneuvers Performed:  None  Complications:  None  Impression:  Sigmoid colon diverticulosis. External hemorrhoids with stigmata of recent bleed. Large anal skin tag.  Recommendations:  Standard instructions given. H&H will be checked today. Metamucil 4 g by mouth daily at bed time. Continue ProctoCream HC on Anusol-HC suppositories daily at bedtime for 2 weeks.  Glendola Friedhoff U  01/01/2014 1:33 PM  CC: Dr. Asencion Noble, MD & Dr. Rayne Du ref. provider found

## 2014-01-03 ENCOUNTER — Encounter (HOSPITAL_COMMUNITY): Payer: Self-pay | Admitting: Internal Medicine

## 2014-01-07 ENCOUNTER — Encounter: Payer: Self-pay | Admitting: Internal Medicine

## 2014-01-07 ENCOUNTER — Ambulatory Visit (INDEPENDENT_AMBULATORY_CARE_PROVIDER_SITE_OTHER): Payer: Medicare Other | Admitting: Internal Medicine

## 2014-01-07 VITALS — BP 98/58 | HR 87 | Ht 68.0 in | Wt 164.0 lb

## 2014-01-07 DIAGNOSIS — I428 Other cardiomyopathies: Secondary | ICD-10-CM

## 2014-01-07 DIAGNOSIS — I4819 Other persistent atrial fibrillation: Secondary | ICD-10-CM

## 2014-01-07 DIAGNOSIS — I4891 Unspecified atrial fibrillation: Secondary | ICD-10-CM

## 2014-01-07 DIAGNOSIS — I5022 Chronic systolic (congestive) heart failure: Secondary | ICD-10-CM

## 2014-01-07 DIAGNOSIS — Z95810 Presence of automatic (implantable) cardiac defibrillator: Secondary | ICD-10-CM

## 2014-01-07 LAB — MDC_IDC_ENUM_SESS_TYPE_INCLINIC
Battery Remaining Longevity: 16.8 mo
Battery Voltage: 2.54 V
Date Time Interrogation Session: 20150831103157
HighPow Impedance: 31.6361
HighPow Impedance: 32 Ohm
Implantable Pulse Generator Serial Number: 499949
Lead Channel Impedance Value: 312.5 Ohm
Lead Channel Impedance Value: 325 Ohm
Lead Channel Pacing Threshold Pulse Width: 0.5 ms
Lead Channel Pacing Threshold Pulse Width: 0.5 ms
Lead Channel Sensing Intrinsic Amplitude: 0.4 mV
Lead Channel Setting Sensing Sensitivity: 0.3 mV
MDC IDC MSMT LEADCHNL RV PACING THRESHOLD AMPLITUDE: 0.5 V
MDC IDC MSMT LEADCHNL RV PACING THRESHOLD AMPLITUDE: 0.5 V
MDC IDC MSMT LEADCHNL RV SENSING INTR AMPL: 5 mV
MDC IDC SET LEADCHNL RA PACING AMPLITUDE: 2 V
MDC IDC SET LEADCHNL RV PACING AMPLITUDE: 2 V
MDC IDC SET LEADCHNL RV PACING PULSEWIDTH: 0.5 ms
MDC IDC SET ZONE DETECTION INTERVAL: 330 ms
MDC IDC STAT BRADY RA PERCENT PACED: 0 %
MDC IDC STAT BRADY RV PERCENT PACED: 5.2 %
Zone Setting Detection Interval: 250 ms
Zone Setting Detection Interval: 300 ms

## 2014-01-07 LAB — PACEMAKER DEVICE OBSERVATION

## 2014-01-07 MED ORDER — DIGOXIN 125 MCG PO TABS: 0.1250 mg | ORAL_TABLET | Freq: Every day | ORAL | Status: DC

## 2014-01-07 NOTE — Progress Notes (Signed)
HPI Mario Cline returns today for followup. He is a pleasant 80 yo man with a h/o an ICM, VT, chronic class 3B systolic CHF, and HTN. In the interim, his chronic systolic heart failure has worsened. He has had 3 hospitalizations. He has also had GI bleeding making him not a candidate for systemic anti-coagulation and also not a candidate for anti-arrhythmic drug therapy and rhythm control. He now has chronic peripheral edema. He feels poorly. Allergies  Allergen Reactions  . Avelox [Moxifloxacin Hcl In Nacl] Other (See Comments)    thrush     Current Outpatient Prescriptions  Medication Sig Dispense Refill  . allopurinol (ZYLOPRIM) 100 MG tablet Take 100 mg by mouth 2 (two) times daily.      . carvedilol (COREG) 3.125 MG tablet Take 1 tablet (3.125 mg total) by mouth 2 (two) times daily with a meal.  60 tablet  3  . docusate sodium (COLACE) 100 MG capsule Take 1 capsule (100 mg total) by mouth daily.  10 capsule  0  . hydrocortisone (ANUSOL-HC) 2.5 % rectal cream Place rectally 3 (three) times daily.  30 g  0  . hydrocortisone (ANUSOL-HC) 25 MG suppository Place 1 suppository (25 mg total) rectally every 12 (twelve) hours. For 12 days  24 suppository  0  . ketoconazole (NIZORAL) 2 % cream Apply 1 application topically 2 (two) times daily.      . metolazone (ZAROXOLYN) 2.5 MG tablet Take 2.5 mg by mouth. Take if wt greater than or equal to 168 lb      . nystatin (MYCOSTATIN) 100000 UNIT/ML suspension Take 5 mLs by mouth 4 (four) times daily.      . potassium chloride SA (K-DUR,KLOR-CON) 20 MEQ tablet Take 1 tablet (20 mEq total) by mouth 3 (three) times daily.  90 tablet  5  . psyllium (METAMUCIL SMOOTH TEXTURE) 28 % packet Take 1 packet by mouth at bedtime.      . torsemide (DEMADEX) 20 MG tablet Take 40 mg by mouth 2 (two) times daily.       No current facility-administered medications for this visit.     Past Medical History  Diagnosis Date  . Nonischemic cardiomyopathy    Nonobstructive CAD 1997, LVEF 10-15% March 2015  . Essential hypertension, benign   . Orthostatic hypotension   . Obstructive sleep apnea 2002    CPAP prescribed  . DJD (degenerative joint disease)   . AICD (automatic cardioverter/defibrillator) present 06/2007    AICD/dual-chamber pacemaker - (St. Jude)- 2/09;   . Atrial fibrillation 04/2009    Rare brief episodes by ICD interrogation until 03/2011->persistent  . Chronic anticoagulation   . Chronic kidney disease, stage III (moderate)     Cardiorenal disease.    ROS:   All systems reviewed and negative except as noted in the HPI.   Past Surgical History  Procedure Laterality Date  . Tonsillectomy    . Cholecystectomy    . Posterior laminectomy / decompression lumbar spine  1985    +fusion; Dr. Pearlie Oyster  . Cardiac defibrillator placement  06/2007  . Esophagogastroduodenoscopy N/A 10/15/2013    Procedure: ESOPHAGOGASTRODUODENOSCOPY (EGD);  Surgeon: Rogene Houston, MD;  [facility];  Service: Endoscopy;  Laterality: N/A;  . Givens capsule study N/A 10/16/2013    Procedure: GIVENS CAPSULE STUDY;  Surgeon: Rogene Houston, MD;  [facility];  Service: Endoscopy;  Laterality: N/A;  . Flexible sigmoidoscopy N/A 01/01/2014    Procedure: FLEXIBLE SIGMOIDOSCOPY;  Surgeon: Mechele Dawley  Laural Golden, MD;  [facility];  Service: Endoscopy;  Laterality: N/A;  62     Family History  Problem Relation Age of Onset  . Heart failure Mother   . Heart attack Father   . Cancer Brother     x2     History   Social History  . Marital Status: Married    Spouse Name: N/A    Number of Children: 2  . Years of Education: N/A   Occupational History  . Retired     Insurance account manager   Social History Main Topics  . Smoking status: Never Smoker   . Smokeless tobacco: Never Used  . Alcohol Use: 0.5 oz/week    1 drink(s) per week  . Drug Use: No  . Sexual Activity: No   Other Topics Concern  . Not  on file   Social History Narrative  . No narrative on file     BP 98/58  Pulse 87  Ht 5\' 8"  (1.727 m)  Wt 164 lb (74.39 kg)  BMI 24.94 kg/m2  SpO2 95%  Physical Exam:  ill appearing elderly man, NAD HEENT: Unremarkable Neck:  7 cm JVD, no thyromegally Back:  No CVA tenderness Lungs:  Clear with no wheezes, rales, or rhonchi HEART:  IRegular rate rhythm, no murmurs, no rubs, no clicks Abd:  soft, positive bowel sounds, no organomegally, no rebound, no guarding Ext:  2 plus pulses, no edema, no cyanosis, no clubbing Skin:  No rashes no nodules Neuro:  CN II through XII intact, motor grossly intact   DEVICE  Normal device function.  See PaceArt for details.   Assess/Plan:

## 2014-01-07 NOTE — Assessment & Plan Note (Signed)
He is class 3B. He feels poorly. We have started digoxin and he is being evaluated for possible ionotropic therapy.

## 2014-01-07 NOTE — Assessment & Plan Note (Signed)
Interogation of his ICD today demonstrates that his ventricular rates are not well controlled. He is above a 100/min almost 50% of the time, despite being very sedentary. I have recommended initiation of digoxin. He will have a digoxin level obtained in 2 weeks. I do not think he would be a good candidate for AV node ablation and BiV pacing as he has too many comorbidities.

## 2014-01-07 NOTE — Assessment & Plan Note (Signed)
His St. Jude DDD ICD is working normally. Will recheck in several months. 

## 2014-01-07 NOTE — Patient Instructions (Signed)
Your physician wants you to follow-up in: 1 year with Dr. Knox Saliva will receive a reminder letter in the mail two months in advance. If you don't receive a letter, please call our office to schedule the follow-up appointment.  Your physician has recommended you make the following change in your medication:   START DIGOXIN 0.125 MG DAILY  Your physician recommends that you return for lab work in: [place]  Remote monitoring is used to monitor your Pacemaker of ICD from home. This monitoring reduces the number of office visits required to check your device to one time per year. It allows [place] to keep an eye on the functioning of your device to ensure it is working properly. You are scheduled for a device check from home on December 2ND. You may send your transmission at any time that day. If you have a wireless device, the transmission will be sent automatically. After your physician reviews your transmission, you will receive a postcard with your next transmission date.

## 2014-01-21 ENCOUNTER — Telehealth: Payer: Self-pay | Admitting: *Deleted

## 2014-01-21 NOTE — Telephone Encounter (Signed)
Pt needs digoxin level,stressed importance to have lab drawn

## 2014-01-21 NOTE — Telephone Encounter (Signed)
Pt is due to have labs done tomorrow but has cellulitis in one arm and infection in the other. Can pt wait to have this done?

## 2014-01-22 ENCOUNTER — Encounter: Payer: Self-pay | Admitting: Cardiovascular Disease

## 2014-01-23 LAB — DIGOXIN LEVEL: Digoxin Level: 1 ng/mL (ref 0.8–2.0)

## 2014-01-25 ENCOUNTER — Telehealth: Payer: Self-pay | Admitting: *Deleted

## 2014-01-25 NOTE — Telephone Encounter (Signed)
Faxed copy to pcp

## 2014-01-25 NOTE — Telephone Encounter (Signed)
Pt is calling for lab results 

## 2014-01-25 NOTE — Telephone Encounter (Signed)
Dig level in normal range :1    Wife made aware

## 2014-01-30 ENCOUNTER — Ambulatory Visit (INDEPENDENT_AMBULATORY_CARE_PROVIDER_SITE_OTHER): Payer: Medicare Other | Admitting: Cardiovascular Disease

## 2014-01-30 ENCOUNTER — Encounter: Payer: Self-pay | Admitting: Cardiovascular Disease

## 2014-01-30 VITALS — BP 102/56 | HR 73 | Ht 68.0 in | Wt 157.0 lb

## 2014-01-30 DIAGNOSIS — I509 Heart failure, unspecified: Secondary | ICD-10-CM

## 2014-01-30 DIAGNOSIS — N183 Chronic kidney disease, stage 3 unspecified: Secondary | ICD-10-CM

## 2014-01-30 DIAGNOSIS — G4733 Obstructive sleep apnea (adult) (pediatric): Secondary | ICD-10-CM

## 2014-01-30 DIAGNOSIS — I482 Chronic atrial fibrillation, unspecified: Secondary | ICD-10-CM

## 2014-01-30 DIAGNOSIS — I130 Hypertensive heart and chronic kidney disease with heart failure and stage 1 through stage 4 chronic kidney disease, or unspecified chronic kidney disease: Secondary | ICD-10-CM

## 2014-01-30 DIAGNOSIS — I5022 Chronic systolic (congestive) heart failure: Secondary | ICD-10-CM

## 2014-01-30 DIAGNOSIS — I4891 Unspecified atrial fibrillation: Secondary | ICD-10-CM

## 2014-01-30 DIAGNOSIS — Z95810 Presence of automatic (implantable) cardiac defibrillator: Secondary | ICD-10-CM

## 2014-01-30 DIAGNOSIS — N189 Chronic kidney disease, unspecified: Secondary | ICD-10-CM

## 2014-01-30 DIAGNOSIS — I428 Other cardiomyopathies: Secondary | ICD-10-CM

## 2014-01-30 NOTE — Progress Notes (Signed)
Patient ID: Mario Cline, male   DOB: 11-07-1928, 80 y.o.   MRN: [id number]      SUBJECTIVE: Mario Cline returns for followup of end-stage heart failure. He has a history of HTN, CKD (baseline Cr 2.1-2.5), chronic atrial fibrillation, OSA on CPAP and chronic systolic heart failure due to nonischemic cardiomyopathy with EF 15% s/p St Jude ICD (cath 1997 with minimal non-obstructive CAD).  He and his wife are considering home inotrope therapy. He is also not a candidate for anti-arrhythmic drug therapy and rhythm control.  He was started on digoxin by Dr. Lovena Le as his heart rate was not well controlled and is also a poor candidate for AV node ablation and biventricular pacing due to his multiple comorbidities. Recent digoxin level of 1. His weight on home scale has ranged from 148-151. He does not sleep well at night. They have not been able to go to church because he he cannot fit into regular dress shoes and has to wear sneakers. His chronic cough has subsided. His only had his metolazone once in the past 3 months. He was hospitalized in August for anorectal bleeding. He underwent a flexible sigmoidoscopy on August 25 which demonstrated diverticulosis and external hemorrhoids.  His wife is concerned about home inotrope therapy as Mario Cline has bad dreams and flails in his sleep and she is worried about the IV coming out.   Review of Systems: As per "subjective", otherwise negative.  Allergies  Allergen Reactions  . Avelox [Moxifloxacin Hcl In Nacl] Other (See Comments)    thrush    Current Outpatient Prescriptions  Medication Sig Dispense Refill  . allopurinol (ZYLOPRIM) 100 MG tablet Take 100 mg by mouth 2 (two) times daily.      . carvedilol (COREG) 3.125 MG tablet Take 1 tablet (3.125 mg total) by mouth 2 (two) times daily with a meal.  60 tablet  3  . cephALEXin (KEFLEX) 500 MG capsule       . digoxin (LANOXIN) 0.125 MG tablet Take 1 tablet (0.125 mg total) by mouth daily.  90 tablet   3  . docusate sodium (COLACE) 100 MG capsule Take 1 capsule (100 mg total) by mouth daily.  10 capsule  0  . hydrocortisone (ANUSOL-HC) 2.5 % rectal cream Place rectally 3 (three) times daily.  30 g  0  . hydrocortisone (ANUSOL-HC) 25 MG suppository Place 1 suppository (25 mg total) rectally every 12 (twelve) hours. For 12 days  24 suppository  0  . ketoconazole (NIZORAL) 2 % cream Apply 1 application topically 2 (two) times daily.      . metolazone (ZAROXOLYN) 2.5 MG tablet Take 2.5 mg by mouth. Take if wt greater than or equal to 168 lb      . nystatin (MYCOSTATIN) 100000 UNIT/ML suspension Take 5 mLs by mouth 4 (four) times daily.      . potassium chloride SA (K-DUR,KLOR-CON) 20 MEQ tablet Take 1 tablet (20 mEq total) by mouth 3 (three) times daily.  90 tablet  5  . psyllium (METAMUCIL SMOOTH TEXTURE) 28 % packet Take 1 packet by mouth at bedtime.      . torsemide (DEMADEX) 20 MG tablet Take 40 mg by mouth 2 (two) times daily.       No current facility-administered medications for this visit.    Past Medical History  Diagnosis Date  . Nonischemic cardiomyopathy     Nonobstructive CAD 1997, LVEF 10-15% March 2015  . Essential hypertension, benign   . Orthostatic hypotension   .  Obstructive sleep apnea 2002    CPAP prescribed  . DJD (degenerative joint disease)   . AICD (automatic cardioverter/defibrillator) present 06/2007    AICD/dual-chamber pacemaker - (St. Jude)- 2/09;   . Atrial fibrillation 04/2009    Rare brief episodes by ICD interrogation until 03/2011->persistent  . Chronic anticoagulation   . Chronic kidney disease, stage III (moderate)     Cardiorenal disease.    Past Surgical History  Procedure Laterality Date  . Tonsillectomy    . Cholecystectomy    . Posterior laminectomy / decompression lumbar spine  1985    +fusion; Dr. Pearlie Oyster  . Cardiac defibrillator placement  06/2007  . Esophagogastroduodenoscopy N/A 10/15/2013    Procedure: ESOPHAGOGASTRODUODENOSCOPY  (EGD);  Surgeon: Rogene Houston, MD;  [facility];  Service: Endoscopy;  Laterality: N/A;  . Givens capsule study N/A 10/16/2013    Procedure: GIVENS CAPSULE STUDY;  Surgeon: Rogene Houston, MD;  [facility];  Service: Endoscopy;  Laterality: N/A;  . Flexible sigmoidoscopy N/A 01/01/2014    Procedure: FLEXIBLE SIGMOIDOSCOPY;  Surgeon: Rogene Houston, MD;  [facility]: [facility];  Service: Endoscopy;  Laterality: N/A;  1230    History   Social History  . Marital Status: Married    Spouse Name: N/A    Number of Children: 2  . Years of Education: N/A   Occupational History  . Retired     Insurance account manager   Social History Main Topics  . Smoking status: Never Smoker   . Smokeless tobacco: Never Used  . Alcohol Use: 0.5 oz/week    1 drink(s) per week  . Drug Use: No  . Sexual Activity: No   Other Topics Concern  . Not on file   Social History Narrative  . No narrative on file     Filed Vitals:   01/30/14 0859  BP: 102/56  Pulse: 73  Height: 5\' 8"  (1.727 m)  Weight: 157 lb (71.215 kg)    PHYSICAL EXAM General: NAD HEENT: Normal. Neck: JVP 9 cmH2O. Lungs: Clear to auscultation bilaterally with normal respiratory effort. CV: Nondisplaced PMI.  Irregular rhythm, normal S1/S2, no S3, soft 1/6 apical holosystolic murmur. 1+ pitting pretibial and periankle edema.  No carotid bruit.   Abdomen: Soft, nontender, no hepatosplenomegaly, no distention.  Neurologic: Alert and oriented. Psych: Normal affect. Skin: Normal. Musculoskeletal: Normal range of motion, no gross deformities. Extremities: No clubbing or cyanosis.   ECG: Most recent ECG reviewed.      ASSESSMENT AND PLAN: 1. Chronic systolic heart failure. Has ST Jude ICD. EF 15% due to NICM. Last echo 07/2013.  NYHA IIIb. Volume status appears stable. Continue torsemide 40 mg bid, with metolazone 2.5 mg as needed for weight >153 lbs.  Continue current dose of carvedilol  3.125 mg twice a day along with digoxin (dig level 1, will need to carefully monitor in face of CKD) He is not on ace/piro due to CKD. BP too low for hydralazine/Imdur.  He is not candidate for LVAD or heart transplant due to his age and multiple comorbidities, and they are considering inotropes.  I emphasized at home inotrope therapy would be for symptom improvement with no mortality benefit.  2. Chronic Atrial fibrillation: Rate controlled. Not an anticoagulation candidate due to GI bleed.  3. CKD stage IV, Creatinine baseline 2.1-2.5, most recently 1.46 on 8/16.   4. OSA- continue nightly CPAP.   Dispo: f/u 3 months.   Kate Sable, M.D., F.A.C.C.

## 2014-01-30 NOTE — Patient Instructions (Signed)
Your physician recommends that you schedule a follow-up appointment in:  3 months    Your physician recommends that you continue on your current medications as directed. Please refer to the Current Medication list given to you today.     Thank you for choosing [facility] !   

## 2014-02-04 ENCOUNTER — Other Ambulatory Visit: Payer: Self-pay | Admitting: Cardiovascular Disease

## 2014-02-09 ENCOUNTER — Telehealth: Payer: Self-pay | Admitting: Cardiology

## 2014-02-09 NOTE — Telephone Encounter (Signed)
Ms Closson called saying her husband's wgt is down to 147. She thinks his goal wgt is 153. I suggested she cut his Demadex back to 20 mg BID until his wgt recovers. He may 40 mg BID 3 x week and 20 mg BID 4 days a week.   Kerin Ransom PA-C 02/09/2014 11:17 AM

## 2014-02-18 ENCOUNTER — Encounter: Payer: Self-pay | Admitting: Cardiovascular Disease

## 2014-02-22 ENCOUNTER — Telehealth: Payer: Self-pay | Admitting: Cardiovascular Disease

## 2014-02-22 NOTE — Telephone Encounter (Signed)
That would be fine 

## 2014-02-22 NOTE — Telephone Encounter (Signed)
Patient would like to speak with nurse regarding patient having cataract surgery.  tgs

## 2014-02-22 NOTE — Telephone Encounter (Signed)
Pt having cataract surgery soon. Wife wants to make sure it's ok with you !

## 2014-02-22 NOTE — Telephone Encounter (Signed)
1036 hrs: LMTCB

## 2014-02-22 NOTE — Telephone Encounter (Signed)
Relayed MD message to wife

## 2014-03-25 ENCOUNTER — Telehealth: Payer: Self-pay | Admitting: *Deleted

## 2014-03-25 NOTE — Telephone Encounter (Signed)
I will forward message to Dr.Koneswaran 

## 2014-03-25 NOTE — Telephone Encounter (Signed)
PT wants to know if we can switch him from Torsemide to [employer]. His insurance covers furosemide with a $3 copayment

## 2014-03-26 NOTE — Telephone Encounter (Signed)
Spoke with wife,relayed MD message

## 2014-03-26 NOTE — Telephone Encounter (Signed)
We have tried Lasix for him in the past with no success. This is why he was switched to torsemide.

## 2014-04-10 ENCOUNTER — Telehealth: Payer: Self-pay | Admitting: Cardiology

## 2014-04-10 ENCOUNTER — Encounter: Payer: Self-pay | Admitting: Internal Medicine

## 2014-04-10 ENCOUNTER — Ambulatory Visit (INDEPENDENT_AMBULATORY_CARE_PROVIDER_SITE_OTHER): Payer: Medicare Other | Admitting: *Deleted

## 2014-04-10 DIAGNOSIS — I429 Cardiomyopathy, unspecified: Secondary | ICD-10-CM

## 2014-04-10 DIAGNOSIS — I428 Other cardiomyopathies: Secondary | ICD-10-CM

## 2014-04-10 LAB — MDC_IDC_ENUM_SESS_TYPE_REMOTE
Battery Remaining Longevity: 4 mo
Battery Voltage: 2.5 V
Brady Statistic RA Percent Paced: 0 %
Brady Statistic RV Percent Paced: 29 %
HighPow Impedance: 32 Ohm
Implantable Pulse Generator Serial Number: 499949
Lead Channel Impedance Value: 300 Ohm
Lead Channel Setting Pacing Amplitude: 2 V
Lead Channel Setting Pacing Pulse Width: 0.5 ms
MDC IDC MSMT LEADCHNL RV IMPEDANCE VALUE: 310 Ohm
MDC IDC MSMT LEADCHNL RV SENSING INTR AMPL: 5.4 mV
MDC IDC SET LEADCHNL RA PACING AMPLITUDE: 2 V
MDC IDC SET LEADCHNL RV SENSING SENSITIVITY: 0.3 mV
MDC IDC SET ZONE DETECTION INTERVAL: 330 ms
Zone Setting Detection Interval: 250 ms
Zone Setting Detection Interval: 300 ms

## 2014-04-10 NOTE — Telephone Encounter (Signed)
Spoke with pt and reminded pt of remote transmission that is due today. Pt verbalized understanding.   

## 2014-04-12 NOTE — Progress Notes (Signed)
Remote ICD transmission.   

## 2014-04-17 ENCOUNTER — Ambulatory Visit: Payer: Medicare Other | Admitting: Cardiovascular Disease

## 2014-04-19 ENCOUNTER — Encounter: Payer: Self-pay | Admitting: Cardiovascular Disease

## 2014-04-19 ENCOUNTER — Ambulatory Visit (INDEPENDENT_AMBULATORY_CARE_PROVIDER_SITE_OTHER): Payer: Medicare Other | Admitting: Cardiovascular Disease

## 2014-04-19 VITALS — BP 100/60 | HR 50 | Ht 69.0 in | Wt 160.0 lb

## 2014-04-19 DIAGNOSIS — I429 Cardiomyopathy, unspecified: Secondary | ICD-10-CM

## 2014-04-19 DIAGNOSIS — I428 Other cardiomyopathies: Secondary | ICD-10-CM

## 2014-04-19 DIAGNOSIS — I482 Chronic atrial fibrillation, unspecified: Secondary | ICD-10-CM

## 2014-04-19 DIAGNOSIS — I5022 Chronic systolic (congestive) heart failure: Secondary | ICD-10-CM

## 2014-04-19 DIAGNOSIS — Z95810 Presence of automatic (implantable) cardiac defibrillator: Secondary | ICD-10-CM

## 2014-04-19 DIAGNOSIS — I130 Hypertensive heart and chronic kidney disease with heart failure and stage 1 through stage 4 chronic kidney disease, or unspecified chronic kidney disease: Secondary | ICD-10-CM

## 2014-04-19 MED ORDER — TORSEMIDE 20 MG PO TABS: 20.0000 mg | ORAL_TABLET | Freq: Two times a day (BID) | ORAL | Status: DC

## 2014-04-19 NOTE — Progress Notes (Signed)
Patient ID: JAICION LAURIE, male   DOB: 1928/12/12, 80 y.o.   MRN: [id number]      SUBJECTIVE: Mr. Dubois returns for followup of end-stage heart failure. He has a history of HTN, CKD (baseline Cr 2.1-2.5), chronic atrial fibrillation, OSA on CPAP and chronic systolic heart failure due to nonischemic cardiomyopathy with EF 15% s/p St Jude ICD (cath 1997 with minimal non-obstructive CAD).  He is not a candidate for anti-arrhythmic drug therapy and rhythm control. He was previously started on digoxin by Dr. Lovena Le as his heart rate was not well controlled and is also a poor candidate for AV node ablation and biventricular pacing due to his multiple comorbidities. BUN/SCr on 03/18/14 34/1.15. Office Wt 160 lbs (157 lbs on 9/23). Home weight today 154 lbs.  He is feeling well. He had gotten down to 146 lbs and was instructed by Dr. Aundra Dubin to reduce torsemide to 20 mg bid. He is wearing compression stockings.   Review of Systems: As per "subjective", otherwise negative.  Allergies  Allergen Reactions  . Avelox [Moxifloxacin Hcl In Nacl] Other (See Comments)    thrush    Current Outpatient Prescriptions  Medication Sig Dispense Refill  . allopurinol (ZYLOPRIM) 100 MG tablet Take 100 mg by mouth 2 (two) times daily.    . carvedilol (COREG) 3.125 MG tablet Take 1 tablet (3.125 mg total) by mouth 2 (two) times daily with a meal. 60 tablet 3  . digoxin (LANOXIN) 0.125 MG tablet Take 1 tablet (0.125 mg total) by mouth daily. 90 tablet 3  . docusate sodium (COLACE) 100 MG capsule Take 1 capsule (100 mg total) by mouth daily. 10 capsule 0  . hydrocortisone (ANUSOL-HC) 2.5 % rectal cream Place rectally 3 (three) times daily. 30 g 0  . hydrocortisone (ANUSOL-HC) 25 MG suppository Place 1 suppository (25 mg total) rectally every 12 (twelve) hours. For 12 days 24 suppository 0  . ketoconazole (NIZORAL) 2 % cream Apply 1 application topically 2 (two) times daily.    . metolazone (ZAROXOLYN) 2.5 MG tablet  Take 2.5 mg by mouth. Take if wt greater than or equal to 168 lb    . nystatin (MYCOSTATIN) 100000 UNIT/ML suspension Take 5 mLs by mouth 4 (four) times daily.    . potassium chloride SA (K-DUR,KLOR-CON) 20 MEQ tablet Take one tablet 3 times daily 90 tablet 3  . psyllium (METAMUCIL SMOOTH TEXTURE) 28 % packet Take 1 packet by mouth at bedtime.    . torsemide (DEMADEX) 20 MG tablet Take 40 mg by mouth 2 (two) times daily.     No current facility-administered medications for this visit.    Past Medical History  Diagnosis Date  . Nonischemic cardiomyopathy     Nonobstructive CAD 1997, LVEF 10-15% March 2015  . Essential hypertension, benign   . Orthostatic hypotension   . Obstructive sleep apnea 2002    CPAP prescribed  . DJD (degenerative joint disease)   . AICD (automatic cardioverter/defibrillator) present 06/2007    AICD/dual-chamber pacemaker - (St. Jude)- 2/09;   . Atrial fibrillation 04/2009    Rare brief episodes by ICD interrogation until 03/2011->persistent  . Chronic anticoagulation   . Chronic kidney disease, stage III (moderate)     Cardiorenal disease.    Past Surgical History  Procedure Laterality Date  . Tonsillectomy    . Cholecystectomy    . Posterior laminectomy / decompression lumbar spine  1985    +fusion; Dr. Pearlie Oyster  . Cardiac defibrillator placement  06/2007  .  Esophagogastroduodenoscopy N/A 10/15/2013    Procedure: ESOPHAGOGASTRODUODENOSCOPY (EGD);  Surgeon: Rogene Houston, MD;  [facility];  Service: Endoscopy;  Laterality: N/A;  . Givens capsule study N/A 10/16/2013    Procedure: GIVENS CAPSULE STUDY;  Surgeon: Rogene Houston, MD;  [facility];  Service: Endoscopy;  Laterality: N/A;  . Flexible sigmoidoscopy N/A 01/01/2014    Procedure: FLEXIBLE SIGMOIDOSCOPY;  Surgeon: Rogene Houston, MD;  [facility];  Service: Endoscopy;  Laterality: N/A;  1230    History   Social History  . Marital Status: Married    Spouse  Name: N/A    Number of Children: 2  . Years of Education: N/A   Occupational History  . Retired     Insurance account manager   Social History Main Topics  . Smoking status: Never Smoker   . Smokeless tobacco: Never Used  . Alcohol Use: 0.6 oz/week    1 Not specified per week  . Drug Use: No  . Sexual Activity: No   Other Topics Concern  . Not on file   Social History Narrative     Filed Vitals:   04/19/14 0950  BP: 100/60  Pulse: 50  Height: 5\' 9"  (1.753 m)  Weight: 160 lb (72.576 kg)   HR: 80 bpm by auscultation  PHYSICAL EXAM General: NAD HEENT: Normal. Neck: JVP 9 cmH2O. Lungs: Clear to auscultation bilaterally with normal respiratory effort. CV: Nondisplaced PMI. Irregular rhythm, normal S1/S2, no S3, soft 1/6 apical holosystolic murmur. 1+ pitting pretibial and periankle edema. No carotid bruit.  Abdomen: Soft, nontender, no hepatosplenomegaly, no distention.  Neurologic: Alert and oriented. Psych: Normal affect. Skin: Normal. Musculoskeletal: Normal range of motion, no gross deformities. Extremities: No clubbing or cyanosis.   ECG: Most recent ECG reviewed.      ASSESSMENT AND PLAN: 1. Chronic systolic heart failure. Has ST Jude ICD. EF 15% due to NICM. Last echo 07/2013.  NYHA IIIb. Volume status appears stable. Continue torsemide 20 mg bid, with metolazone 2.5 mg as needed for weight gain 3 lbs or more within 24 hours and/or home weight 158-160 lbs.  Continue current dose of carvedilol 3.125 mg twice a day along with digoxin (will need to carefully monitor in face of CKD). He is not on ACEI/spironolactone due to CKD. BP too low for hydralazine/Imdur.  He is not a candidate for LVAD or heart transplant due to his age and multiple comorbidities, and they are still considering inotropes.  I previously emphasized at home inotrope therapy would be for symptom improvement with no mortality benefit.  2. Chronic Atrial fibrillation: Rate  controlled. Not an anticoagulation candidate due to GI bleed.  3. CKD stage IV, Creatinine baseline 2.1-2.5, most recently 1.15 on 11/9.   4. OSA- continue nightly CPAP.   Dispo: f/u 3 months.   Kate Sable, M.D., F.A.C.C.

## 2014-04-19 NOTE — Patient Instructions (Signed)
Your physician recommends that you schedule a follow-up appointment in: 3 months   Continue taking Torsemide 20 mg twice a day    Thank you for choosing [facility] !

## 2014-04-26 ENCOUNTER — Encounter: Payer: Self-pay | Admitting: Cardiology

## 2014-04-30 ENCOUNTER — Telehealth: Payer: Self-pay | Admitting: Cardiology

## 2014-04-30 NOTE — Telephone Encounter (Signed)
Crystal w/ [facility] called and stated that pt is being added on for emergency surgery and the anaesthesiologist wanted st jude rep at the procedure. Rep information given to crystal will fax last remote report to [facility].

## 2014-05-01 ENCOUNTER — Ambulatory Visit: Payer: Medicare Other | Admitting: Internal Medicine

## 2014-05-07 ENCOUNTER — Other Ambulatory Visit: Payer: Self-pay | Admitting: Cardiovascular Disease

## 2014-05-29 ENCOUNTER — Ambulatory Visit (HOSPITAL_COMMUNITY)
Admission: RE | Admit: 2014-05-29 | Discharge: 2014-05-29 | Disposition: A | Discharge status: Home / Self Care | Payer: Medicare Other | Source: Ambulatory Visit | Attending: Internal Medicine | Admitting: Internal Medicine

## 2014-05-29 ENCOUNTER — Other Ambulatory Visit (HOSPITAL_COMMUNITY): Payer: Self-pay | Admitting: Internal Medicine

## 2014-05-29 DIAGNOSIS — R1011 Right upper quadrant pain: Secondary | ICD-10-CM | POA: Diagnosis not present

## 2014-05-29 DIAGNOSIS — J90 Pleural effusion, not elsewhere classified: Secondary | ICD-10-CM | POA: Insufficient documentation

## 2014-05-29 DIAGNOSIS — R188 Other ascites: Secondary | ICD-10-CM | POA: Diagnosis not present

## 2014-06-20 ENCOUNTER — Telehealth: Payer: Self-pay | Admitting: Cardiology

## 2014-06-20 NOTE — Telephone Encounter (Signed)
Spoke w/ pt wife and informed her that pt device has reached ERI and that a scheduler will call pt to schedule an appt. Pt wife verbalize understanding.

## 2014-06-28 ENCOUNTER — Encounter: Payer: Self-pay | Admitting: Internal Medicine

## 2014-07-04 ENCOUNTER — Other Ambulatory Visit: Payer: Self-pay | Admitting: Cardiovascular Disease

## 2014-07-12 ENCOUNTER — Ambulatory Visit (INDEPENDENT_AMBULATORY_CARE_PROVIDER_SITE_OTHER): Payer: PRIVATE HEALTH INSURANCE | Admitting: Internal Medicine

## 2014-07-12 ENCOUNTER — Encounter: Payer: Self-pay | Admitting: Internal Medicine

## 2014-07-12 VITALS — BP 94/62 | HR 69 | Ht 69.0 in | Wt 161.8 lb

## 2014-07-12 DIAGNOSIS — I10 Essential (primary) hypertension: Secondary | ICD-10-CM

## 2014-07-12 DIAGNOSIS — I5022 Chronic systolic (congestive) heart failure: Secondary | ICD-10-CM | POA: Diagnosis not present

## 2014-07-12 DIAGNOSIS — I482 Chronic atrial fibrillation, unspecified: Secondary | ICD-10-CM

## 2014-07-12 DIAGNOSIS — Z95810 Presence of automatic (implantable) cardiac defibrillator: Secondary | ICD-10-CM | POA: Diagnosis not present

## 2014-07-12 NOTE — Assessment & Plan Note (Signed)
His ventricular rate is well controlled. Will follow. Not an anti-coagulation candidate.

## 2014-07-12 NOTE — Progress Notes (Signed)
HPI [name] returns today for followup. He is a pleasant 81 yo man with a h/o an ICM, VT, chronic class 3B systolic CHF, and HTN.  He has also had GI bleeding making him not a candidate for systemic anti-coagulation and also not a candidate for anti-arrhythmic drug therapy and rhythm control. He has done a bit better with digoxin. Allergies  Allergen Reactions  . Avelox [Moxifloxacin Hcl In Nacl] Other (See Comments)    thrush     Current Outpatient Prescriptions  Medication Sig Dispense Refill  . allopurinol (ZYLOPRIM) 100 MG tablet Take 100 mg by mouth 2 (two) times daily.    . carvedilol (COREG) 3.125 MG tablet TAKE (1) TABLET BY MOUTH TWICE A DAY WITH A MEAL. 60 tablet 6  . digoxin (LANOXIN) 0.125 MG tablet Take 1 tablet (0.125 mg total) by mouth daily. 90 tablet 3  . docusate sodium (COLACE) 100 MG capsule Take 1 capsule (100 mg total) by mouth daily. (Patient taking differently: Take 100 mg by mouth as needed. ) 10 capsule 0  . metolazone (ZAROXOLYN) 2.5 MG tablet Take 2.5 mg by mouth as needed. Take if wt greater than or equal to 168 lb    . potassium chloride SA (K-DUR,KLOR-CON) 20 MEQ tablet TAKE 1 TABLET BY MOUTH 3 TIMES DAILY. 90 tablet 3  . psyllium (METAMUCIL SMOOTH TEXTURE) 28 % packet Take 1 packet by mouth at bedtime. (Patient taking differently: Take 1 packet by mouth as needed. )    . torsemide (DEMADEX) 20 MG tablet Take 1 tablet (20 mg total) by mouth 2 (two) times daily. 180 tablet 3   No current facility-administered medications for this visit.     Past Medical History  Diagnosis Date  . Nonischemic cardiomyopathy     Nonobstructive CAD 1997, LVEF 10-15% March 2015  . Essential hypertension, benign   . Orthostatic hypotension   . Obstructive sleep apnea 2002    CPAP prescribed  . DJD (degenerative joint disease)   . AICD (automatic cardioverter/defibrillator) present 06/2007    AICD/dual-chamber pacemaker - (St. Jude)- 2/09;   . Atrial fibrillation 04/2009    Rare brief episodes by ICD interrogation until 03/2011->persistent  . Chronic anticoagulation   . Chronic kidney disease, stage III (moderate)     Cardiorenal disease.    ROS:   All systems reviewed and negative except as noted in the HPI.   Past Surgical History  Procedure Laterality Date  . Tonsillectomy    . Cholecystectomy    . Posterior laminectomy / decompression lumbar spine  1985    +fusion; Dr. Pearlie Oyster  . Cardiac defibrillator placement  06/2007  . Esophagogastroduodenoscopy N/A 10/15/2013    Procedure: ESOPHAGOGASTRODUODENOSCOPY (EGD);  Surgeon: Rogene Houston, MD;  [facility];  Service: Endoscopy;  Laterality: N/A;  . Givens capsule study N/A 10/16/2013    Procedure: GIVENS CAPSULE STUDY;  Surgeon: Rogene Houston, MD;  [facility];  Service: Endoscopy;  Laterality: N/A;  . Flexible sigmoidoscopy N/A 01/01/2014    Procedure: FLEXIBLE SIGMOIDOSCOPY;  Surgeon: Rogene Houston, MD;  [facility];  Service: Endoscopy;  Laterality: N/A;  76     Family History  Problem Relation Age of Onset  . Heart failure Mother   . Heart attack Father   . Cancer Brother     x2     History   Social History  . Marital Status: Married    Spouse Name: N/A  . Number of Children:  2  . Years of Education: N/A   Occupational History  . Retired     Insurance account manager   Social History Main Topics  . Smoking status: Never Smoker   . Smokeless tobacco: Never Used  . Alcohol Use: 0.6 oz/week    1 Standard drinks or equivalent per week  . Drug Use: No  . Sexual Activity: No   Other Topics Concern  . Not on file   Social History Narrative     BP 94/62 mmHg  Pulse 69  Ht 5\' 9"  (1.753 m)  Wt 161 lb 12.8 oz (73.392 kg)  BMI 23.88 kg/m2  Physical Exam:  ill appearing elderly man, NAD HEENT: Unremarkable Neck:  7 cm JVD, no thyromegally Back:  No CVA tenderness Lungs:  Clear with no wheezes, rales, or rhonchi HEART:   IRegular rate rhythm, no murmurs, no rubs, no clicks Abd:  soft, positive bowel sounds, no organomegally, no rebound, no guarding Ext:  2 plus pulses, no edema, no cyanosis, no clubbing Skin:  No rashes no nodules Neuro:  CN II through XII intact, motor grossly intact   DEVICE  Normal device function.  See PaceArt for details. He is at [facility].  Assess/Plan:

## 2014-07-12 NOTE — Assessment & Plan Note (Signed)
His symptoms remain class 3. Will follow. He will continue his current meds.

## 2014-07-12 NOTE — Assessment & Plan Note (Signed)
His blood pressure has been well controlled. No change in meds. He is trying to avoid sodium.

## 2014-07-12 NOTE — Patient Instructions (Signed)
Your physician recommends that you schedule a follow-up appointment as needed with Dr. Lovena Le  Your physician recommends that you continue on your current medications as directed. Please refer to the Current Medication list given to you today.  Thank you for choosing [facility]!

## 2014-07-12 NOTE — Assessment & Plan Note (Signed)
His St. Jude ICD is at [employer]. I have discussed the treatment options with the patient and his wife. He has advanced age, multiple comorbidities and never had an ICD shock. I have offered him the option of not having his ICD replaced and he is inclined not to have another device. He does not have symptomatic bradycardia at this point. He does not have an indication for a Biv device.

## 2014-07-22 ENCOUNTER — Ambulatory Visit (INDEPENDENT_AMBULATORY_CARE_PROVIDER_SITE_OTHER): Payer: PRIVATE HEALTH INSURANCE | Admitting: Cardiovascular Disease

## 2014-07-22 ENCOUNTER — Encounter: Payer: Self-pay | Admitting: Cardiovascular Disease

## 2014-07-22 VITALS — BP 92/58 | HR 72 | Ht 70.0 in | Wt 161.6 lb

## 2014-07-22 DIAGNOSIS — Z95810 Presence of automatic (implantable) cardiac defibrillator: Secondary | ICD-10-CM

## 2014-07-22 DIAGNOSIS — N183 Chronic kidney disease, stage 3 unspecified: Secondary | ICD-10-CM

## 2014-07-22 DIAGNOSIS — G4733 Obstructive sleep apnea (adult) (pediatric): Secondary | ICD-10-CM

## 2014-07-22 DIAGNOSIS — I130 Hypertensive heart and chronic kidney disease with heart failure and stage 1 through stage 4 chronic kidney disease, or unspecified chronic kidney disease: Secondary | ICD-10-CM

## 2014-07-22 DIAGNOSIS — I428 Other cardiomyopathies: Secondary | ICD-10-CM

## 2014-07-22 DIAGNOSIS — I5022 Chronic systolic (congestive) heart failure: Secondary | ICD-10-CM

## 2014-07-22 DIAGNOSIS — I429 Cardiomyopathy, unspecified: Secondary | ICD-10-CM

## 2014-07-22 DIAGNOSIS — I482 Chronic atrial fibrillation, unspecified: Secondary | ICD-10-CM

## 2014-07-22 DIAGNOSIS — I9589 Other hypotension: Secondary | ICD-10-CM

## 2014-07-22 NOTE — Progress Notes (Signed)
Patient ID: Mario Cline, male   DOB: [birth year], 81 y.o.   MRN: [id number]      SUBJECTIVE: Mario Cline returns for followup of end-stage heart failure. He has a history of essential hypertension, CKD (baseline Cr 2.1-2.5), chronic atrial fibrillation, OSA on CPAP and chronic systolic heart failure due to nonischemic cardiomyopathy with EF 15% s/p St Jude ICD (cath 1997 with minimal non-obstructive CAD).  His ICD is nearing end of life, and he has appropriately elected not to have the generator replaced. He is not a candidate for anti-arrhythmic drug therapy and rhythm control. He was previously started on digoxin by Dr. Lovena Le as his heart rate was not well controlled and is also a poor candidate for AV node ablation and biventricular pacing due to his multiple comorbidities.  Wt 161 lbs (160 lbs on 04/19/14).  He has been feeling very well. He celebrated his son's birthday yesterday and had birthday cake and some biscuits. There granddaughter recently returned back to [facility] after spring break. His first cousin, Mario Cline, is also my patient and a Economist in [place].    Review of Systems: As per "subjective", otherwise negative.  Allergies  Allergen Reactions  . Avelox [Moxifloxacin Hcl In Nacl] Other (See Comments)    thrush    Current Outpatient Prescriptions  Medication Sig Dispense Refill  . allopurinol (ZYLOPRIM) 100 MG tablet Take 100 mg by mouth 2 (two) times daily.    . carvedilol (COREG) 3.125 MG tablet TAKE (1) TABLET BY MOUTH TWICE A DAY WITH A MEAL. 60 tablet 6  . digoxin (LANOXIN) 0.125 MG tablet Take 1 tablet (0.125 mg total) by mouth daily. 90 tablet 3  . docusate sodium (COLACE) 100 MG capsule Take 1 capsule (100 mg total) by mouth daily. (Patient taking differently: Take 100 mg by mouth as needed. ) 10 capsule 0  . metolazone (ZAROXOLYN) 2.5 MG tablet Take 2.5 mg by mouth as needed. Take if wt greater than or equal to 168 lb    . potassium chloride SA  (K-DUR,KLOR-CON) 20 MEQ tablet TAKE 1 TABLET BY MOUTH 3 TIMES DAILY. 90 tablet 3  . psyllium (METAMUCIL SMOOTH TEXTURE) 28 % packet Take 1 packet by mouth at bedtime. (Patient taking differently: Take 1 packet by mouth as needed. )    . torsemide (DEMADEX) 20 MG tablet Take 1 tablet (20 mg total) by mouth 2 (two) times daily. 180 tablet 3   No current facility-administered medications for this visit.    Past Medical History  Diagnosis Date  . Nonischemic cardiomyopathy     Nonobstructive CAD 1997, LVEF 10-15% March 2015  . Essential hypertension, benign   . Orthostatic hypotension   . Obstructive sleep apnea 2002    CPAP prescribed  . DJD (degenerative joint disease)   . AICD (automatic cardioverter/defibrillator) present 06/2007    AICD/dual-chamber pacemaker - (St. Jude)- 2/09;   . Atrial fibrillation 04/2009    Rare brief episodes by ICD interrogation until 03/2011->persistent  . Chronic anticoagulation   . Chronic kidney disease, stage III (moderate)     Cardiorenal disease.    Past Surgical History  Procedure Laterality Date  . Tonsillectomy    . Cholecystectomy    . Posterior laminectomy / decompression lumbar spine  1985    +fusion; Dr. Pearlie Oyster  . Cardiac defibrillator placement  06/2007  . Esophagogastroduodenoscopy N/A 10/15/2013    Procedure: ESOPHAGOGASTRODUODENOSCOPY (EGD);  Surgeon: Rogene Houston, MD;  [facility];  Service: Endoscopy;  Laterality: N/A;  . Givens capsule study N/A 10/16/2013    Procedure: GIVENS CAPSULE STUDY;  Surgeon: Rogene Houston, MD;  [facility];  Service: Endoscopy;  Laterality: N/A;  . Flexible sigmoidoscopy N/A 01/01/2014    Procedure: FLEXIBLE SIGMOIDOSCOPY;  Surgeon: Rogene Houston, MD;  [facility];  Service: Endoscopy;  Laterality: N/A;  1230    History   Social History  . Marital Status: Married    Spouse Name: N/A  . Number of Children: 2  . Years of Education: N/A   Occupational History    . Retired     Insurance account manager   Social History Main Topics  . Smoking status: Never Smoker   . Smokeless tobacco: Never Used  . Alcohol Use: 0.6 oz/week    1 Standard drinks or equivalent per week  . Drug Use: No  . Sexual Activity: No   Other Topics Concern  . Not on file   Social History Narrative    BP 92/58  Pulse 46  SpO2 99%  Weight 161 lb 9.6 oz (73.301 kg) Height 5\' 10"  (1.778 m)    PHYSICAL EXAM General: NAD HEENT: Normal. Neck: JVP 7 cmH2O. Lungs: Clear to auscultation bilaterally with normal respiratory effort. CV: Nondisplaced PMI. Irregular rhythm, normal S1/S2, +S3, soft 1/6 apical holosystolic murmur. Trace pretibial and periankle edema.  Abdomen: Soft, nontender, no distention.  Neurologic: Alert and oriented. Psych: Normal affect. Skin: Normal. Musculoskeletal: Normal range of motion, no gross deformities. Extremities: No clubbing or cyanosis.   ECG: Most recent ECG reviewed.      ASSESSMENT AND PLAN: 1. Chronic systolic heart failure. Has ST Jude ICD. EF 15% due to NICM. Last echo 07/2013.  NYHA IIIb. Volume status appears stable. Continue torsemide 20 mg bid, with metolazone 2.5 mg as needed for weight gain 3 lbs or more within 24 hours and/or home weight 158-160 lbs.  In spite of hypotension and bradycardia, he denies lightheadedness and dizziness and feels quite well.  I will continue carvedilol 3.125 mg twice a day along with digoxin (will need to carefully monitor in face of CKD).  He is not on ACEI/spironolactone due to CKD. BP too low for hydralazine/Imdur.  He is not a candidate for LVAD or heart transplant due to his age and multiple comorbidities.  I previously emphasized at home inotrope therapy would be for symptom improvement with no mortality benefit.  I do not believe they are considering this any longer.  2. Chronic Atrial fibrillation: Rate controlled. Not an anticoagulation candidate due to GI  bleed.  3. CKD stage IV, Creatinine baseline 2.1-2.5, most recently 1.15 on 11/9.   4. OSA- continue nightly CPAP.   Dispo: f/u 3 months.   Kate Sable, M.D., F.A.C.C.

## 2014-07-22 NOTE — Patient Instructions (Signed)
Your physician recommends that you schedule a follow-up appointment in: 3 months with Dr Bronson Ing   Your physician recommends that you continue on your current medications as directed. Please refer to the Current Medication list given to you today.   Thank you for choosing [facility] !

## 2014-10-29 ENCOUNTER — Ambulatory Visit (INDEPENDENT_AMBULATORY_CARE_PROVIDER_SITE_OTHER): Payer: Medicare Other | Admitting: Cardiovascular Disease

## 2014-10-29 VITALS — BP 92/58 | HR 70 | Ht 68.0 in | Wt 160.0 lb

## 2014-10-29 DIAGNOSIS — I482 Chronic atrial fibrillation, unspecified: Secondary | ICD-10-CM

## 2014-10-29 DIAGNOSIS — I5022 Chronic systolic (congestive) heart failure: Secondary | ICD-10-CM | POA: Diagnosis not present

## 2014-10-29 DIAGNOSIS — I10 Essential (primary) hypertension: Secondary | ICD-10-CM | POA: Diagnosis not present

## 2014-10-29 DIAGNOSIS — I428 Other cardiomyopathies: Secondary | ICD-10-CM

## 2014-10-29 DIAGNOSIS — I130 Hypertensive heart and chronic kidney disease with heart failure and stage 1 through stage 4 chronic kidney disease, or unspecified chronic kidney disease: Secondary | ICD-10-CM

## 2014-10-29 DIAGNOSIS — I429 Cardiomyopathy, unspecified: Secondary | ICD-10-CM

## 2014-10-29 DIAGNOSIS — E782 Mixed hyperlipidemia: Secondary | ICD-10-CM

## 2014-10-29 DIAGNOSIS — G4733 Obstructive sleep apnea (adult) (pediatric): Secondary | ICD-10-CM

## 2014-10-29 NOTE — Patient Instructions (Signed)
Your physician recommends that you schedule a follow-up appointment in: October with [name]   Your physician recommends that you continue on your current medications as directed. Please refer to the Current Medication list given to you today.    Thank you for choosing [facility] !

## 2014-10-29 NOTE — Progress Notes (Signed)
Patient ID: Mario Cline, male   DOB: November 03, 1928, 81 y.o.   MRN: [id number]      SUBJECTIVE: Mario Cline returns for followup of end-stage heart failure. He has a history of essential hypertension, CKD (baseline Cr 2.1-2.5), chronic atrial fibrillation, OSA on CPAP and chronic systolic heart failure due to nonischemic cardiomyopathy with EF 15% s/p St Jude ICD (cath 1997 with minimal non-obstructive CAD).  He is not a candidate for anti-arrhythmic drug therapy and rhythm control. He was previously started on digoxin by Dr. Lovena Le as his heart rate was not well controlled, and is also a poor candidate for AV node ablation and biventricular pacing due to his multiple comorbidities.  Wt 160 lbs (161 lbs on 07/22/14). Wt on home scale before getting dressed and eating breakfast: 155 lbs.  He is doing well and denies abdominal distention and leg swelling. He and his family are going to [facility] later this week. He will soon be celebrating his 86th birthday.  ECG performed in the office today demonstrates a ventricular paced rhythm.   Review of Systems: As per "subjective", otherwise negative.  Allergies  Allergen Reactions  . Avelox [Moxifloxacin Hcl In Nacl] Other (See Comments)    thrush    Current Outpatient Prescriptions  Medication Sig Dispense Refill  . allopurinol (ZYLOPRIM) 100 MG tablet Take 100 mg by mouth 2 (two) times daily.    . carvedilol (COREG) 3.125 MG tablet TAKE (1) TABLET BY MOUTH TWICE A DAY WITH A MEAL. 60 tablet 6  . digoxin (LANOXIN) 0.125 MG tablet Take 1 tablet (0.125 mg total) by mouth daily. 90 tablet 3  . potassium chloride SA (K-DUR,KLOR-CON) 20 MEQ tablet TAKE 1 TABLET BY MOUTH 3 TIMES DAILY. 90 tablet 3  . psyllium (METAMUCIL SMOOTH TEXTURE) 28 % packet Take 1 packet by mouth at bedtime. (Patient taking differently: Take 1 packet by mouth as needed. )    . torsemide (DEMADEX) 20 MG tablet Take 1 tablet (20 mg total) by mouth 2 (two) times daily. 180 tablet 3    No current facility-administered medications for this visit.    Past Medical History  Diagnosis Date  . Nonischemic cardiomyopathy     Nonobstructive CAD 1997, LVEF 10-15% March 2015  . Essential hypertension, benign   . Orthostatic hypotension   . Obstructive sleep apnea 2002    CPAP prescribed  . DJD (degenerative joint disease)   . AICD (automatic cardioverter/defibrillator) present 06/2007    AICD/dual-chamber pacemaker - (St. Jude)- 2/09;   . Atrial fibrillation 04/2009    Rare brief episodes by ICD interrogation until 03/2011->persistent  . Chronic anticoagulation   . Chronic kidney disease, stage III (moderate)     Cardiorenal disease.    Past Surgical History  Procedure Laterality Date  . Tonsillectomy    . Cholecystectomy    . Posterior laminectomy / decompression lumbar spine  1985    +fusion; Dr. Pearlie Oyster  . Cardiac defibrillator placement  06/2007  . Esophagogastroduodenoscopy N/A 10/15/2013    Procedure: ESOPHAGOGASTRODUODENOSCOPY (EGD);  Surgeon: Rogene Houston, MD;  [facility];  Service: Endoscopy;  Laterality: N/A;  . Givens capsule study N/A 10/16/2013    Procedure: GIVENS CAPSULE STUDY;  Surgeon: Rogene Houston, MD;  [facility];  Service: Endoscopy;  Laterality: N/A;  . Flexible sigmoidoscopy N/A 01/01/2014    Procedure: FLEXIBLE SIGMOIDOSCOPY;  Surgeon: Rogene Houston, MD;  [facility];  Service: Endoscopy;  Laterality: N/A;  1230  History   Social History  . Marital Status: Married    Spouse Name: N/A  . Number of Children: 2  . Years of Education: N/A   Occupational History  . Retired     Insurance account manager   Social History Main Topics  . Smoking status: Never Smoker   . Smokeless tobacco: Never Used  . Alcohol Use: 0.6 oz/week    1 Standard drinks or equivalent per week  . Drug Use: No  . Sexual Activity: No   Other Topics Concern  . Not on file   Social History Narrative      Filed Vitals:   10/29/14 1310  BP: 112/66  Pulse: 70  Height: 5\' 8"  (1.727 m)  Weight: 160 lb (72.576 kg)  SpO2: 99%    PHYSICAL EXAM General: NAD HEENT: Normal. Neck: No JVD. Lungs: Clear to auscultation bilaterally with normal respiratory effort. CV: Nondisplaced PMI. Irregular rhythm, normal S1/S2, +S3, soft 1/6 apical holosystolic murmur. No pretibial or periankle edema.  Abdomen: Soft, nontender, no distention.  Neurologic: Alert and oriented. Psych: Normal affect. Skin: Normal. Musculoskeletal: Normal range of motion, no gross deformities. Extremities: No clubbing or cyanosis.   ECG: Most recent ECG reviewed.      ASSESSMENT AND PLAN: 1. Chronic systolic heart failure. EF 15% due to NICM. Last echo 07/2013. NYHA IIIb. Volume status appears stable. Continue torsemide 20 mg bid, with metolazone 2.5 mg as needed for weight gain 3 lbs or more within 24 hours and/or home weight 158-160 lbs.  I will continue carvedilol 3.125 mg twice a day along with digoxin (will need to carefully monitor in face of CKD).  He is not on ACEI/spironolactone due to CKD. He is not a candidate for LVAD or heart transplant due to his age and multiple comorbidities.  They are no longer considering home inotrope therapy.  2. Chronic Atrial fibrillation: Rate controlled. Not an anticoagulation candidate due to GI bleed.  3. CKD stage IV, Creatinine baseline 2.1-2.5, most recently 1.44 on 08/09/14.  4. OSA: Continue nightly CPAP.   Dispo: f/u in October.   Kate Sable, M.D., F.A.C.C.

## 2014-11-29 ENCOUNTER — Telehealth: Payer: Self-pay | Admitting: Cardiovascular Disease

## 2014-11-29 DIAGNOSIS — Z79899 Other long term (current) drug therapy: Secondary | ICD-10-CM

## 2014-11-29 NOTE — Telephone Encounter (Signed)
Patient's wife called to report that the patient keeps losing weight and is down to 148 lbs as of this morning. Wilma said that Dr. Haroldine Laws suggested that a good weight range for Mario Cline to stay in would be 153-158.   Mario Cline is calling to ask if they can reduce his fluid pill from twice a day down to once a a day.

## 2014-11-29 NOTE — Telephone Encounter (Signed)
Replied on separate note.

## 2014-11-29 NOTE — Telephone Encounter (Signed)
-----   Message from Lendon Colonel, NP sent at 11/29/2014 11:52 AM EDT ----- Regarding: Medication Mario Cline can decrease his torsemide to once daily. He will need to have a BMET, today or over the weekend. If he becomes symptomatic by having edema or worsened breathing with decreased dose, he will need to go back to BID.   He may need to have doses adjusted to 20 in am and 10 in pm. Please make sure he has appt with Dr. Bronson Ing soon for follow up.   Thank you!

## 2014-11-29 NOTE — Telephone Encounter (Signed)
Pt of Dr Bronson Ing out of time,,will forward to Dr Harl Bowie

## 2014-11-29 NOTE — Telephone Encounter (Signed)
Called PT and spoke to his wife about his medication. She was made aware that she could change his Torsemide to once daily, but if he had any edema or breathing problems to go back to twice daily. PT has an appointment Aug 29th @3  with Dr. Bronson Ing. Also told PT's wife he needs lab work Artist) done today.

## 2014-11-29 NOTE — Telephone Encounter (Signed)
-----   Message from Lendon Colonel, NP sent at 11/29/2014 11:52 AM EDT ----- Regarding: Medication Mr. Bolotin can decrease his torsemide to once daily. He will need to have a BMET, today or over the weekend. If he becomes symptomatic by having edema or worsened breathing with decreased dose, he will need to go back to BID.   He may need to have doses adjusted to 20 in am and 10 in pm. Please make sure he has appt with Dr. Bronson Ing soon for follow up.   Thank you!

## 2014-11-30 LAB — BASIC METABOLIC PANEL
BUN/Creatinine Ratio: 33 — ABNORMAL HIGH (ref 10–22)
BUN: 43 mg/dL — ABNORMAL HIGH (ref 8–27)
CO2: 26 mmol/L (ref 18–29)
CREATININE: 1.31 mg/dL — AB (ref 0.76–1.27)
Calcium: 9.4 mg/dL (ref 8.6–10.2)
Chloride: 104 mmol/L (ref 97–108)
GFR calc non Af Amer: 50 mL/min/{1.73_m2} — ABNORMAL LOW (ref >59)
GFR, EST AFRICAN AMERICAN: 57 mL/min/{1.73_m2} — AB (ref >59)
Glucose: 100 mg/dL — ABNORMAL HIGH (ref 65–99)
Potassium: 4.5 mmol/L (ref 3.5–5.2)
SODIUM: 145 mmol/L — AB (ref 134–144)

## 2014-12-28 ENCOUNTER — Other Ambulatory Visit: Payer: Self-pay | Admitting: Internal Medicine

## 2014-12-28 ENCOUNTER — Other Ambulatory Visit: Payer: Self-pay | Admitting: Cardiovascular Disease

## 2014-12-30 ENCOUNTER — Other Ambulatory Visit: Payer: Self-pay | Admitting: Internal Medicine

## 2015-01-06 ENCOUNTER — Ambulatory Visit: Payer: PRIVATE HEALTH INSURANCE | Admitting: Cardiovascular Disease

## 2015-02-10 ENCOUNTER — Ambulatory Visit (HOSPITAL_COMMUNITY)
Admission: RE | Admit: 2015-02-10 | Discharge: 2015-02-10 | Disposition: A | Discharge status: Home / Self Care | Payer: PRIVATE HEALTH INSURANCE | Source: Ambulatory Visit | Attending: Internal Medicine | Admitting: Internal Medicine

## 2015-02-10 ENCOUNTER — Other Ambulatory Visit (HOSPITAL_COMMUNITY): Payer: Self-pay | Admitting: Internal Medicine

## 2015-02-10 DIAGNOSIS — I517 Cardiomegaly: Secondary | ICD-10-CM | POA: Insufficient documentation

## 2015-02-10 DIAGNOSIS — R05 Cough: Secondary | ICD-10-CM

## 2015-02-10 DIAGNOSIS — R059 Cough, unspecified: Secondary | ICD-10-CM

## 2015-02-24 ENCOUNTER — Encounter: Payer: Self-pay | Admitting: Cardiovascular Disease

## 2015-02-24 ENCOUNTER — Ambulatory Visit (INDEPENDENT_AMBULATORY_CARE_PROVIDER_SITE_OTHER): Payer: Medicare Other | Admitting: Cardiovascular Disease

## 2015-02-24 VITALS — BP 92/52 | HR 69 | Ht 68.0 in | Wt 149.0 lb

## 2015-02-24 DIAGNOSIS — I10 Essential (primary) hypertension: Secondary | ICD-10-CM

## 2015-02-24 DIAGNOSIS — I482 Chronic atrial fibrillation, unspecified: Secondary | ICD-10-CM

## 2015-02-24 DIAGNOSIS — I130 Hypertensive heart and chronic kidney disease with heart failure and stage 1 through stage 4 chronic kidney disease, or unspecified chronic kidney disease: Secondary | ICD-10-CM

## 2015-02-24 DIAGNOSIS — Z79899 Other long term (current) drug therapy: Secondary | ICD-10-CM | POA: Diagnosis not present

## 2015-02-24 DIAGNOSIS — I5022 Chronic systolic (congestive) heart failure: Secondary | ICD-10-CM | POA: Diagnosis not present

## 2015-02-24 DIAGNOSIS — N183 Chronic kidney disease, stage 3 unspecified: Secondary | ICD-10-CM

## 2015-02-24 DIAGNOSIS — Z95810 Presence of automatic (implantable) cardiac defibrillator: Secondary | ICD-10-CM

## 2015-02-24 DIAGNOSIS — I429 Cardiomyopathy, unspecified: Secondary | ICD-10-CM

## 2015-02-24 NOTE — Progress Notes (Signed)
Patient ID: Mario Cline, male   DOB: 10/16/1928, 81 y.o.   MRN: [id number]      SUBJECTIVE: Mario Cline returns for followup of end-stage heart failure. He has a history of essential hypertension, CKD (baseline Cr 2.1-2.5), chronic atrial fibrillation, OSA on CPAP and chronic systolic heart failure due to nonischemic cardiomyopathy with EF 15% s/p St Jude ICD (cath 1997 with minimal non-obstructive CAD).  He is not a candidate for anti-arrhythmic drug therapy and rhythm control. He was previously started on digoxin by Dr. Lovena Le as his heart rate was not well controlled, and is also a poor candidate for AV node ablation and biventricular pacing due to his multiple comorbidities.  Wt 149 lbs (160 lbs on 10/29/14). Wt on home scale before getting dressed and eating breakfast: 144 lbs.  He and his wife have both been dealing with colds. He received Levaquin from his PCP. He has not been eating much since his current illness. His wife was concerned about giving full dose of torsemide so she cut the dose in half.    Review of Systems: As per "subjective", otherwise negative.  Allergies  Allergen Reactions  . Avelox [Moxifloxacin Hcl In Nacl] Other (See Comments)    thrush    Current Outpatient Prescriptions  Medication Sig Dispense Refill  . allopurinol (ZYLOPRIM) 100 MG tablet Take 100 mg by mouth 2 (two) times daily.    . carvedilol (COREG) 3.125 MG tablet TAKE (1) TABLET BY MOUTH TWICE A DAY WITH A MEAL. 60 tablet 3  . DIGOX 125 MCG tablet TAKE ONE TABLET BY MOUTH ONCE DAILY. 90 tablet 0  . potassium chloride SA (K-DUR,KLOR-CON) 20 MEQ tablet TAKE 1 TABLET BY MOUTH 3 TIMES DAILY. 90 tablet 3  . psyllium (METAMUCIL SMOOTH TEXTURE) 28 % packet Take 1 packet by mouth at bedtime. (Patient taking differently: Take 1 packet by mouth as needed. )    . torsemide (DEMADEX) 20 MG tablet Take 1 tablet (20 mg total) by mouth 2 (two) times daily. 180 tablet 3   No current facility-administered  medications for this visit.    Past Medical History  Diagnosis Date  . Nonischemic cardiomyopathy ([place])     Nonobstructive CAD 1997, LVEF 10-15% March 2015  . Essential hypertension, benign   . Orthostatic hypotension   . Obstructive sleep apnea 2002    CPAP prescribed  . DJD (degenerative joint disease)   . AICD (automatic cardioverter/defibrillator) present 06/2007    AICD/dual-chamber pacemaker - (St. Jude)- 2/09;   . Atrial fibrillation ([place]) 04/2009    Rare brief episodes by ICD interrogation until 03/2011->persistent  . Chronic anticoagulation   . Chronic kidney disease, stage III (moderate)     Cardiorenal disease.    Past Surgical History  Procedure Laterality Date  . Tonsillectomy    . Cholecystectomy    . Posterior laminectomy / decompression lumbar spine  1985    +fusion; Dr. Pearlie Oyster  . Cardiac defibrillator placement  06/2007  . Esophagogastroduodenoscopy N/A 10/15/2013    Procedure: ESOPHAGOGASTRODUODENOSCOPY (EGD);  Surgeon: Rogene Houston, MD;  [facility];  Service: Endoscopy;  Laterality: N/A;  . Givens capsule study N/A 10/16/2013    Procedure: GIVENS CAPSULE STUDY;  Surgeon: Rogene Houston, MD;  [facility];  Service: Endoscopy;  Laterality: N/A;  . Flexible sigmoidoscopy N/A 01/01/2014    Procedure: FLEXIBLE SIGMOIDOSCOPY;  Surgeon: Rogene Houston, MD;  [facility];  Service: Endoscopy;  Laterality: N/A;  1230  Social History   Social History  . Marital Status: Married    Spouse Name: N/A  . Number of Children: 2  . Years of Education: N/A   Occupational History  . Retired     Insurance account manager   Social History Main Topics  . Smoking status: Never Smoker   . Smokeless tobacco: Never Used  . Alcohol Use: 0.6 oz/week    1 Standard drinks or equivalent per week  . Drug Use: No  . Sexual Activity: No   Other Topics Concern  . Not on file   Social History Narrative     Filed Vitals:    02/24/15 1013  BP: 92/52  Pulse: 69  Height: 5\' 8"  (1.727 m)  Weight: 149 lb (67.586 kg)  SpO2: 97%    PHYSICAL EXAM General: NAD, elderly, frail. HEENT: Normal. Neck: No JVD. Lungs: Clear to auscultation bilaterally with normal respiratory effort. CV: Nondisplaced PMI. Irregular rhythm, normal S1/S2, +S3, soft 1/6 apical holosystolic murmur. No pretibial or periankle edema.  Abdomen: Soft, nontender, no distention.  Neurologic: Alert and oriented. Psych: Normal affect. Skin: Normal. Musculoskeletal: No gross deformities. Extremities: No clubbing or cyanosis.   ECG: Most recent ECG reviewed.      ASSESSMENT AND PLAN: 1. Chronic systolic heart failure. EF 15% due to NICM. Last echo 07/2013. NYHA IIIb. Volume status appears stable. Continue torsemide 20 mg bid (informed them to be careful about reducing diuretic dose has more to do with cardiac cachexia than excessive fluid loss), with metolazone 2.5 mg as needed for weight gain 3 lbs or more within 24 hours and/or home weight 158-160 lbs. Currently 149 lbs indicative of cardiac cachexia. I will continue carvedilol 3.125 mg twice a day along with digoxin (will need to carefully monitor in face of CKD).  He is not on ACEI/spironolactone due to CKD. He is not a candidate for LVAD or heart transplant due to his age and multiple comorbidities.  They are no longer considering home inotrope therapy.  2. Chronic Atrial fibrillation: Rate controlled. Not an anticoagulation candidate due to GI bleed.  3. CKD stage IV, Creatinine baseline 2.1-2.5, most recently 1.36 on 01/30/15.  4. OSA: Continue nightly CPAP.   Dispo: f/u in 4 months.  Kate Sable, M.D., F.A.C.C.

## 2015-02-24 NOTE — Patient Instructions (Signed)
Your physician recommends that you schedule a follow-up appointment in: February 2017 with Dr Bronson Ing     Your physician recommends that you continue on your current medications as directed. Please refer to the Current Medication list given to you today.     Thank you for choosing [facility] !

## 2015-03-29 ENCOUNTER — Other Ambulatory Visit: Payer: Self-pay | Admitting: Cardiovascular Disease

## 2015-04-10 ENCOUNTER — Telehealth: Payer: Self-pay | Admitting: Cardiovascular Disease

## 2015-04-10 ENCOUNTER — Other Ambulatory Visit: Payer: Self-pay

## 2015-04-10 DIAGNOSIS — I5041 Acute combined systolic (congestive) and diastolic (congestive) heart failure: Secondary | ICD-10-CM

## 2015-04-10 NOTE — Telephone Encounter (Signed)
Spoke to pt's wife and she was told by his primary Dr. Parks Ranger he had lost 8 lbs. by their scale since June. She stated his weight was 151 lbs by their office scale, but at home it was 145. Which is what it was on her last office visit with you on 10-17. ( as our scale it was 149 lbs. ) Please advise.

## 2015-04-10 NOTE — Telephone Encounter (Signed)
If there has truly been any weight loss, it is likely due to cardiac cachexia.  Would not change diuretic dosage.

## 2015-04-10 NOTE — Telephone Encounter (Signed)
Called and explained to pt's wife that the diuretic dosage does not need to be changed.

## 2015-04-10 NOTE — Telephone Encounter (Signed)
Pt has lost 9lbs in 8 wks and he's wondering if he needs to decrease his fluid pill to a half instead of a whole

## 2015-05-01 ENCOUNTER — Telehealth: Payer: Self-pay | Admitting: Cardiovascular Disease

## 2015-05-01 NOTE — Telephone Encounter (Signed)
Son chuck reports since his mom called last on 04/10/15, his weight has gone from 145 lbs per home scale and is now down to 139 lbs. His appetite is good,please advise

## 2015-05-01 NOTE — Telephone Encounter (Signed)
Pt's son is calling concerning the pt's weight loss, he's taking 2 fluid pills a day and wondering if they need to cut that back to just one a day

## 2015-05-02 NOTE — Telephone Encounter (Signed)
Route to DOD,Dr Harrington Challenger

## 2015-05-02 NOTE — Telephone Encounter (Signed)
Spoke with son last night,I await response from MD

## 2015-05-02 NOTE — Telephone Encounter (Signed)
Spoke to son Pt is feeling good   Taking Demedex 20 2x per day and potassium 3x per day Weight is down    Eating  Labs 2 wks   If wt stale keep same  If down cut back demedex to 1x per day and potassium 1x per day Labs  (BMET and BNP ) on Tuesday.  12.27

## 2015-05-06 ENCOUNTER — Other Ambulatory Visit: Payer: Self-pay

## 2015-05-06 DIAGNOSIS — I255 Ischemic cardiomyopathy: Secondary | ICD-10-CM

## 2015-05-31 ENCOUNTER — Other Ambulatory Visit: Payer: Self-pay | Admitting: Cardiovascular Disease

## 2015-06-12 ENCOUNTER — Encounter: Payer: Self-pay | Admitting: Cardiovascular Disease

## 2015-06-12 ENCOUNTER — Ambulatory Visit (INDEPENDENT_AMBULATORY_CARE_PROVIDER_SITE_OTHER): Payer: Medicare Other | Admitting: Cardiovascular Disease

## 2015-06-12 VITALS — BP 116/64 | HR 77 | Wt 153.0 lb

## 2015-06-12 DIAGNOSIS — I482 Chronic atrial fibrillation, unspecified: Secondary | ICD-10-CM

## 2015-06-12 DIAGNOSIS — I429 Cardiomyopathy, unspecified: Secondary | ICD-10-CM | POA: Diagnosis not present

## 2015-06-12 DIAGNOSIS — N183 Chronic kidney disease, stage 3 unspecified: Secondary | ICD-10-CM

## 2015-06-12 DIAGNOSIS — I5022 Chronic systolic (congestive) heart failure: Secondary | ICD-10-CM

## 2015-06-12 DIAGNOSIS — I130 Hypertensive heart and chronic kidney disease with heart failure and stage 1 through stage 4 chronic kidney disease, or unspecified chronic kidney disease: Secondary | ICD-10-CM

## 2015-06-12 NOTE — Patient Instructions (Signed)
Your physician wants you to follow-up in: 4 months with Dr Koneswaran You will receive a reminder letter in the mail two months in advance. If you don't receive a letter, please call our office to schedule the follow-up appointment.   Your physician recommends that you continue on your current medications as directed. Please refer to the Current Medication list given to you today.      Thank you for choosing [facility] !        

## 2015-06-12 NOTE — Progress Notes (Signed)
Patient ID: Mario Cline, male   DOB: Dec 16, 1928, 82 y.o.   MRN: [id number]      SUBJECTIVE: Mario Cline returns for followup of end-stage heart failure. He has a history of essential hypertension, CKD (baseline Cr 2.1-2.5), chronic atrial fibrillation, OSA on CPAP and chronic systolic heart failure due to nonischemic cardiomyopathy with EF 15% s/p St Jude ICD (cath 1997 with minimal non-obstructive CAD).  He is not a candidate for anti-arrhythmic drug therapy and rhythm control. He was previously started on digoxin by Dr. Lovena Le as his heart rate was not well controlled, and is also a poor candidate for AV node ablation and biventricular pacing due to his multiple comorbidities.  Wt 153 lbs (149 lbs on 02/24/15).  Denies worsening exertional dyspnea and leg swelling. Wearing compression stockings.  Wife recently diagnosed with lung cancer and has just started chemotherapy.   Review of Systems: As per "subjective", otherwise negative.  Allergies  Allergen Reactions  . Avelox [Moxifloxacin Hcl In Nacl] Other (See Comments)    thrush    Current Outpatient Prescriptions  Medication Sig Dispense Refill  . allopurinol (ZYLOPRIM) 100 MG tablet Take 100 mg by mouth 2 (two) times daily.    . carvedilol (COREG) 3.125 MG tablet TAKE (1) TABLET BY MOUTH TWICE A DAY WITH A MEAL. 60 tablet 6  . DIGOX 125 MCG tablet TAKE ONE TABLET BY MOUTH ONCE DAILY. 90 tablet 0  . potassium chloride SA (K-DUR,KLOR-CON) 20 MEQ tablet TAKE 1 TABLET BY MOUTH 3 TIMES DAILY. 90 tablet 3  . torsemide (DEMADEX) 20 MG tablet TAKE 1 TABLET BY MOUTH TWICE DAILY. 180 tablet 0   No current facility-administered medications for this visit.    Past Medical History  Diagnosis Date  . Nonischemic cardiomyopathy ([place])     Nonobstructive CAD 1997, LVEF 10-15% March 2015  . Essential hypertension, benign   . Orthostatic hypotension   . Obstructive sleep apnea 2002    CPAP prescribed  . DJD (degenerative joint disease)   .  AICD (automatic cardioverter/defibrillator) present 06/2007    AICD/dual-chamber pacemaker - (St. Jude)- 2/09;   . Atrial fibrillation ([place]) 04/2009    Rare brief episodes by ICD interrogation until 03/2011->persistent  . Chronic anticoagulation   . Chronic kidney disease, stage III (moderate)     Cardiorenal disease.    Past Surgical History  Procedure Laterality Date  . Tonsillectomy    . Cholecystectomy    . Posterior laminectomy / decompression lumbar spine  1985    +fusion; Dr. Pearlie Oyster  . Cardiac defibrillator placement  06/2007  . Esophagogastroduodenoscopy N/A 10/15/2013    Procedure: ESOPHAGOGASTRODUODENOSCOPY (EGD);  Surgeon: Rogene Houston, MD;  [facility];  Service: Endoscopy;  Laterality: N/A;  . Givens capsule study N/A 10/16/2013    Procedure: GIVENS CAPSULE STUDY;  Surgeon: Rogene Houston, MD;  [facility];  Service: Endoscopy;  Laterality: N/A;  . Flexible sigmoidoscopy N/A 01/01/2014    Procedure: FLEXIBLE SIGMOIDOSCOPY;  Surgeon: Rogene Houston, MD;  [facility];  Service: Endoscopy;  Laterality: N/A;  1230    Social History   Social History  . Marital Status: Married    Spouse Name: N/A  . Number of Children: 2  . Years of Education: N/A   Occupational History  . Retired     Insurance account manager   Social History Main Topics  . Smoking status: Never Smoker   . Smokeless tobacco: Never Used  . Alcohol  Use: 0.6 oz/week    1 Standard drinks or equivalent per week  . Drug Use: No  . Sexual Activity: No   Other Topics Concern  . Not on file   Social History Narrative     Filed Vitals:   06/12/15 1253  BP: 116/64  Pulse: 77  Weight: 153 lb (69.4 kg)  SpO2: 99%    PHYSICAL EXAM General: NAD, elderly, frail. HEENT: Normal. Neck: No JVD. Lungs: Clear to auscultation bilaterally with normal respiratory effort. CV: Nondisplaced PMI. Irregular rhythm, normal S1/S2, +S3, soft 1/6 apical  holosystolic murmur. No pretibial or periankle edema.  Abdomen: Soft, nontender, no distention.  Neurologic: Alert and oriented. Psych: Normal affect. Skin: Normal. Musculoskeletal: No gross deformities. Extremities: No clubbing or cyanosis.   ECG: Most recent ECG reviewed.      ASSESSMENT AND PLAN: 1. Chronic systolic heart failure. EF 15% due to NICM. Last echo 07/2013. NYHA IIIb. Volume status appears stable. Continue torsemide 20 mg bid with metolazone 2.5 mg as needed for weight gain 3 lbs or more within 24 hours and/or home weight 158-160 lbs. Currently 153 lbs indicative of cardiac cachexia. I will continue carvedilol 3.125 mg twice a day along with digoxin (will need to carefully monitor in face of CKD).  He is not on ACEI/spironolactone due to CKD. He is not a candidate for LVAD or heart transplant due to his age and multiple comorbidities.  They are no longer considering home inotrope therapy.  2. Chronic Atrial fibrillation: Rate controlled. Not an anticoagulation candidate due to GI bleed.  3. CKD stage IV, Creatinine baseline 2.1-2.5, most recently 1.39 on 05/27/15.  4. OSA: Continue nightly CPAP.   Dispo: f/u in 4 months.   Kate Sable, M.D., F.A.C.C.

## 2015-06-24 ENCOUNTER — Other Ambulatory Visit: Payer: Self-pay | Admitting: Cardiovascular Disease

## 2015-10-13 ENCOUNTER — Ambulatory Visit (INDEPENDENT_AMBULATORY_CARE_PROVIDER_SITE_OTHER): Payer: Medicare Other | Admitting: Cardiovascular Disease

## 2015-10-13 ENCOUNTER — Encounter: Payer: Self-pay | Admitting: Cardiovascular Disease

## 2015-10-13 VITALS — BP 94/52 | HR 71 | Ht 69.0 in | Wt 147.0 lb

## 2015-10-13 DIAGNOSIS — Z95810 Presence of automatic (implantable) cardiac defibrillator: Secondary | ICD-10-CM

## 2015-10-13 DIAGNOSIS — I482 Chronic atrial fibrillation, unspecified: Secondary | ICD-10-CM

## 2015-10-13 DIAGNOSIS — I429 Cardiomyopathy, unspecified: Secondary | ICD-10-CM

## 2015-10-13 DIAGNOSIS — I130 Hypertensive heart and chronic kidney disease with heart failure and stage 1 through stage 4 chronic kidney disease, or unspecified chronic kidney disease: Secondary | ICD-10-CM

## 2015-10-13 DIAGNOSIS — I10 Essential (primary) hypertension: Secondary | ICD-10-CM

## 2015-10-13 DIAGNOSIS — E782 Mixed hyperlipidemia: Secondary | ICD-10-CM

## 2015-10-13 DIAGNOSIS — Z79899 Other long term (current) drug therapy: Secondary | ICD-10-CM

## 2015-10-13 DIAGNOSIS — N183 Chronic kidney disease, stage 3 unspecified: Secondary | ICD-10-CM

## 2015-10-13 DIAGNOSIS — I5022 Chronic systolic (congestive) heart failure: Secondary | ICD-10-CM | POA: Diagnosis not present

## 2015-10-13 DIAGNOSIS — G4733 Obstructive sleep apnea (adult) (pediatric): Secondary | ICD-10-CM

## 2015-10-13 NOTE — Patient Instructions (Signed)
Your physician wants you to follow-up in: 6 months You will receive a reminder letter in the mail two months in advance. If you don't receive a letter, please call our office to schedule the follow-up appointment.    Your physician recommends that you continue on your current medications as directed. Please refer to the Current Medication list given to you today.    If you need a refill on your cardiac medications before your next appointment, please call your pharmacy.     Thank you for choosing  [facility] !        

## 2015-10-13 NOTE — Progress Notes (Signed)
Patient ID: Mario Cline, male   DOB: Sep 03, 1928, 82 y.o.   MRN: [id number]      SUBJECTIVE: Mario Cline returns for followup of end-stage heart failure. He has a history of essential hypertension, CKD (baseline Cr 2.1-2.5), chronic atrial fibrillation, OSA on CPAP and chronic systolic heart failure due to nonischemic cardiomyopathy with EF 15% s/p St Jude ICD (cath 1997 with minimal non-obstructive CAD).  He is not a candidate for anti-arrhythmic drug therapy and rhythm control. He was previously started on digoxin by Dr. Lovena Le as his heart rate was not well controlled, and is also a poor candidate for AV node ablation and biventricular pacing due to his multiple comorbidities.  Wt 147 lbs (153 lbs on 06/12/15).  Denies worsening exertional dyspnea and leg swelling.   His wife was diagnosed with lung cancer earlier this year and was on chemotherapy but did not tolerate it after a week.   His son, Mario Cline, is here today.   Review of Systems: As per "subjective", otherwise negative.  Allergies  Allergen Reactions  . Avelox [Moxifloxacin Hcl In Nacl] Other (See Comments)    thrush    Current Outpatient Prescriptions  Medication Sig Dispense Refill  . allopurinol (ZYLOPRIM) 100 MG tablet Take 100 mg by mouth 2 (two) times daily.    . carvedilol (COREG) 3.125 MG tablet TAKE (1) TABLET BY MOUTH TWICE A DAY WITH A MEAL. 60 tablet 6  . DIGOX 125 MCG tablet TAKE ONE TABLET BY MOUTH ONCE DAILY. 90 tablet 3  . potassium chloride SA (K-DUR,KLOR-CON) 20 MEQ tablet TAKE 1 TABLET BY MOUTH 3 TIMES DAILY. 90 tablet 3  . sulfamethoxazole-trimethoprim (BACTRIM DS,SEPTRA DS) 800-160 MG tablet Take 1 tablet by mouth 2 (two) times daily.    Marland Kitchen torsemide (DEMADEX) 20 MG tablet TAKE 1 TABLET BY MOUTH TWICE DAILY. 180 tablet 0   No current facility-administered medications for this visit.    Past Medical History  Diagnosis Date  . Nonischemic cardiomyopathy ([place])     Nonobstructive CAD 1997, LVEF 10-15%  March 2015  . Essential hypertension, benign   . Orthostatic hypotension   . Obstructive sleep apnea 2002    CPAP prescribed  . DJD (degenerative joint disease)   . AICD (automatic cardioverter/defibrillator) present 06/2007    AICD/dual-chamber pacemaker - (St. Jude)- 2/09;   . Atrial fibrillation ([place]) 04/2009    Rare brief episodes by ICD interrogation until 03/2011->persistent  . Chronic anticoagulation   . Chronic kidney disease, stage III (moderate)     Cardiorenal disease.    Past Surgical History  Procedure Laterality Date  . Tonsillectomy    . Cholecystectomy    . Posterior laminectomy / decompression lumbar spine  1985    +fusion; Dr. Pearlie Oyster  . Cardiac defibrillator placement  06/2007  . Esophagogastroduodenoscopy N/A 10/15/2013    Procedure: ESOPHAGOGASTRODUODENOSCOPY (EGD);  Surgeon: Rogene Houston, MD;  [facility];  Service: Endoscopy;  Laterality: N/A;  . Givens capsule study N/A 10/16/2013    Procedure: GIVENS CAPSULE STUDY;  Surgeon: Rogene Houston, MD;  [facility];  Service: Endoscopy;  Laterality: N/A;  . Flexible sigmoidoscopy N/A 01/01/2014    Procedure: FLEXIBLE SIGMOIDOSCOPY;  Surgeon: Rogene Houston, MD;  [facility];  Service: Endoscopy;  Laterality: N/A;  1230    Social History   Social History  . Marital Status: Married    Spouse Name: N/A  . Number of Children: 2  . Years of Education: N/A  Occupational History  . Retired     Insurance account manager   Social History Main Topics  . Smoking status: Never Smoker   . Smokeless tobacco: Never Used  . Alcohol Use: 0.6 oz/week    1 Standard drinks or equivalent per week  . Drug Use: No  . Sexual Activity: No   Other Topics Concern  . Not on file   Social History Narrative     Filed Vitals:   10/13/15 1324  BP: 94/52  Pulse: 71  Height: 5\' 9"  (1.753 m)  Weight: 147 lb (66.679 kg)  SpO2: 93%    PHYSICAL EXAM General: NAD, elderly,  frail. HEENT: Normal. Neck: No JVD. Lungs: Clear to auscultation bilaterally with normal respiratory effort. CV: Nondisplaced PMI. Irregular rhythm, normal S1/S2, +S3, soft 1/6 apical holosystolic murmur. No pretibial and trace periankle edema.  Abdomen: Soft, nontender, mild distention.  Neurologic: Alert and oriented. Psych: Normal affect. Skin: Normal. Musculoskeletal: No gross deformities. Extremities: No clubbing or cyanosis.    ECG: Most recent ECG reviewed.      ASSESSMENT AND PLAN: 1. Chronic systolic heart failure. EF 15% due to NICM. Last echo 07/2013. NYHA IIIb. Volume status appears stable. Continue torsemide 20 mg bid with metolazone 2.5 mg as needed for weight gain 3 lbs or more within 24 hours. Currently 147 lbs indicative of cardiac cachexia. I will continue carvedilol 3.125 mg twice a day along with digoxin (will need to carefully monitor in face of CKD).  He is not on ACEI/spironolactone due to CKD. He is not a candidate for LVAD or heart transplant due to his age and multiple comorbidities.   2. Chronic Atrial fibrillation: Rate controlled. Not an anticoagulation candidate due to GI bleed.  3. CKD stage IV, Creatinine baseline 2.1-2.5, most recently 1.46 on 08/07/15.  4. OSA: Continue nightly CPAP.   Dispo: f/u in 6 months.   Kate Sable, M.D., F.A.C.C.

## 2015-10-31 ENCOUNTER — Ambulatory Visit (INDEPENDENT_AMBULATORY_CARE_PROVIDER_SITE_OTHER): Payer: Medicare Other | Admitting: Urology

## 2015-10-31 ENCOUNTER — Other Ambulatory Visit (HOSPITAL_COMMUNITY)
Admission: AD | Admit: 2015-10-31 | Discharge: 2015-10-31 | Disposition: A | Discharge status: Home / Self Care | Payer: Medicare Other | Source: Other Acute Inpatient Hospital | Attending: Urology | Admitting: Urology

## 2015-10-31 DIAGNOSIS — R350 Frequency of micturition: Secondary | ICD-10-CM | POA: Insufficient documentation

## 2015-10-31 DIAGNOSIS — R3915 Urgency of urination: Secondary | ICD-10-CM

## 2015-10-31 LAB — URINALYSIS, ROUTINE W REFLEX MICROSCOPIC
Bilirubin Urine: NEGATIVE
Glucose, UA: NEGATIVE mg/dL
HGB URINE DIPSTICK: NEGATIVE
Ketones, ur: NEGATIVE mg/dL
Leukocytes, UA: NEGATIVE
Nitrite: NEGATIVE
PH: 5.5 (ref 5.0–8.0)
Protein, ur: NEGATIVE mg/dL
SPECIFIC GRAVITY, URINE: 1.01 (ref 1.005–1.030)

## 2015-11-04 ENCOUNTER — Telehealth: Payer: Self-pay | Admitting: Internal Medicine

## 2015-11-04 NOTE — Telephone Encounter (Signed)
Patient's son is requesting to speak with Dr.Taylor's nurse regarding patient's device. / tg

## 2015-11-04 NOTE — Telephone Encounter (Signed)
Son, chuck, was wondering if his dad's ICD should be replaced now since he is doing better in the last year. He states that originally the decision was made to not to replace battery but now he questions if his father should have it replaced. Will forward to Dr.Taylor and Dr.Koneswaran  I spoke with device clinic and was told as of December 2015, patient had 4 months battery life left .  I have left message for son to call back to discuss what he wants to do

## 2015-11-06 NOTE — Telephone Encounter (Signed)
Son called back, he will speak with father tonight and determine if they want to make an apt with Dr Lovena Le

## 2015-12-23 ENCOUNTER — Other Ambulatory Visit: Payer: Self-pay | Admitting: Cardiovascular Disease

## 2015-12-26 IMAGING — CR DG CHEST 2V
2 series · 2 of 2 positions shown · non-contrast
Comparison: 09/17/2013

CLINICAL DATA: Cough.

EXAM:
CHEST  2 VIEW

[view not recorded (1 of 2)]
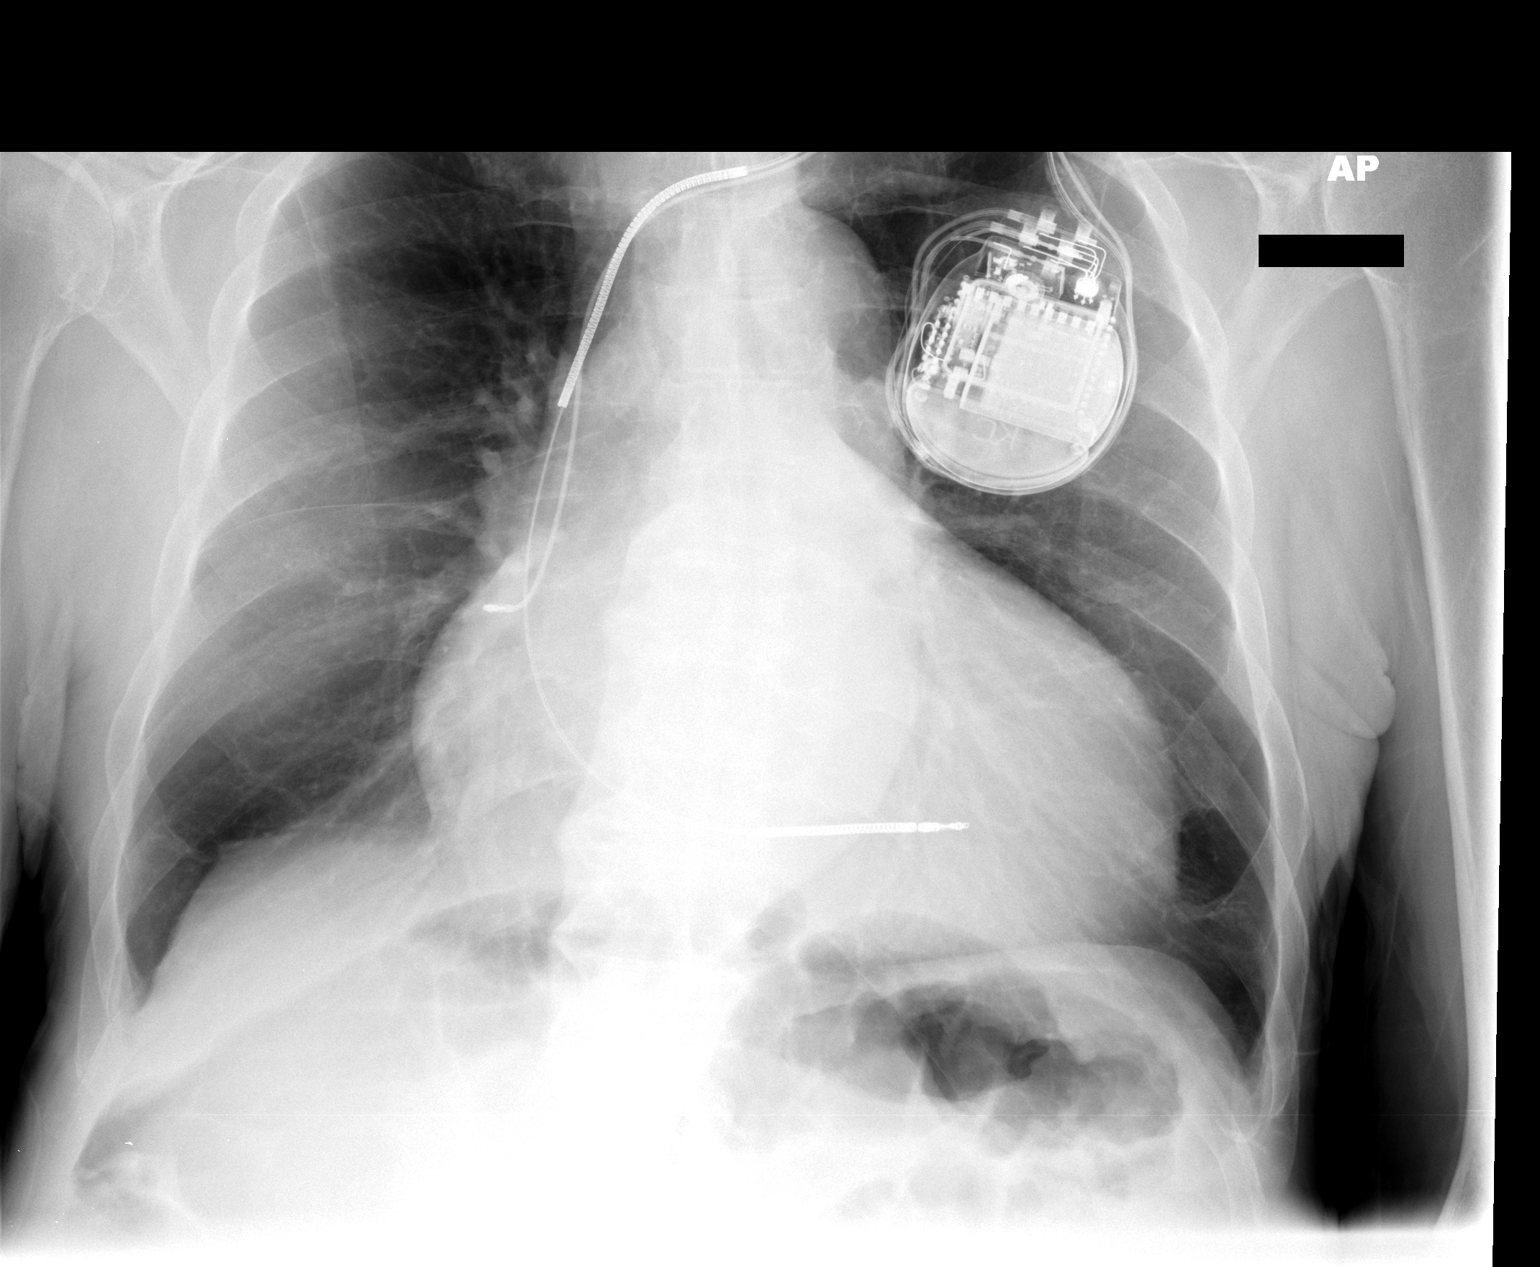

[view not recorded (2 of 2)]
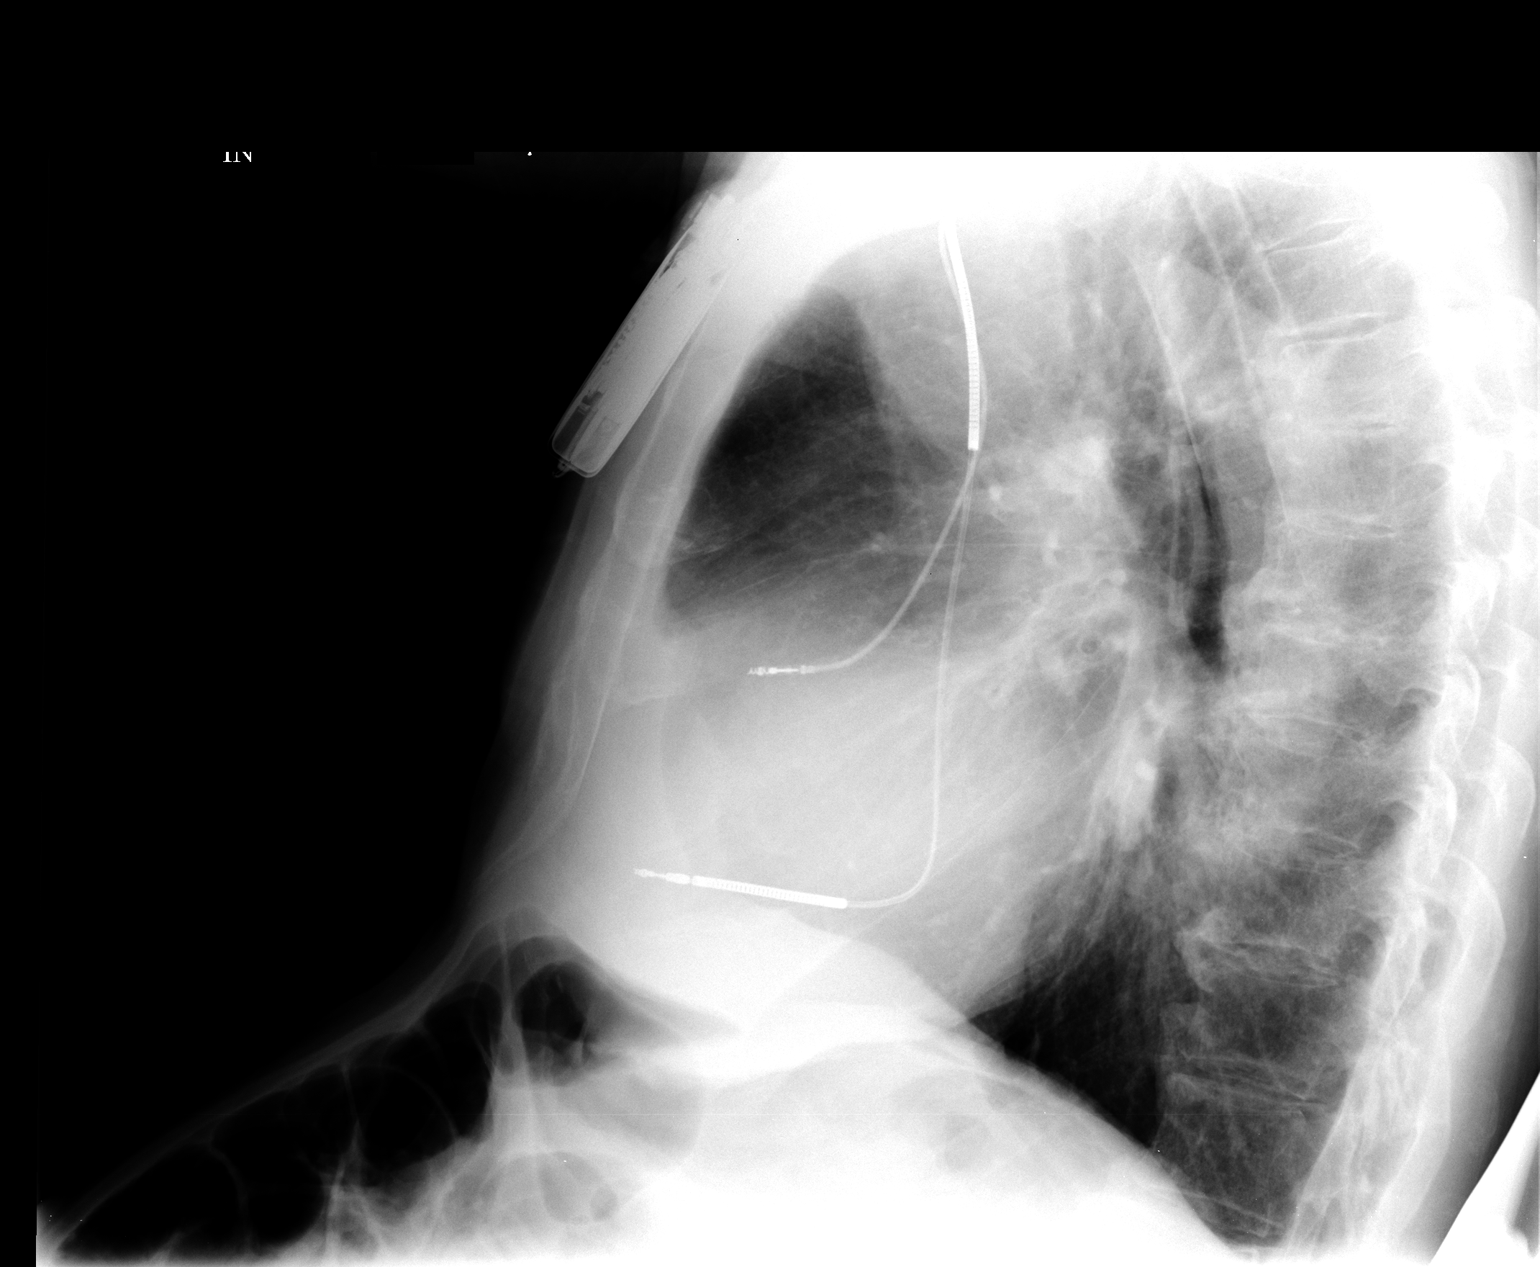

[2 of 2 positions shown; findings below may reference images not displayed]

FINDINGS: Cardiac enlargement. Pulmonary vascularity is normal. Stable
appearance of cardiac pacemaker. Infiltrative changes seen
previously in the right mid lung are not demonstrated today.
Probable emphysematous changes in the lungs. No focal consolidation.
No blunting of costophrenic angles. Tortuous aorta.
IMPRESSION: Emphysematous changes in the lungs. Cardiac enlargement. No evidence
of active pulmonary disease.

## 2016-03-04 ENCOUNTER — Institutional Professional Consult (permissible substitution): Payer: Medicare Other | Admitting: Pulmonary Disease

## 2016-03-09 ENCOUNTER — Other Ambulatory Visit (INDEPENDENT_AMBULATORY_CARE_PROVIDER_SITE_OTHER): Payer: Self-pay | Admitting: Internal Medicine

## 2016-03-09 MED ORDER — SULFAMETHOXAZOLE-TRIMETHOPRIM 800-160 MG PO TABS: 1.0000 | ORAL_TABLET | Freq: Two times a day (BID) | ORAL | 0 refills | Status: DC

## 2016-03-09 NOTE — Telephone Encounter (Signed)
GI [facility] panel positive for Yersinia. Dr. Willey Blade is out of town. Will treat with Bactrim DS twice a day for 7 days If diarrhea does not resolve will plan to see patient office.

## 2016-03-10 ENCOUNTER — Telehealth (INDEPENDENT_AMBULATORY_CARE_PROVIDER_SITE_OTHER): Payer: Self-pay | Admitting: Internal Medicine

## 2016-03-10 NOTE — Telephone Encounter (Signed)
Patient's son, Tyleek Whiles called and stated that Dr. Laural Golden called his bother yesterday with their dad's results.  The brother doesn't remember what Dr. Laural Golden told him was found.  He would like to speak to Dr. Laural Golden.  [phone]

## 2016-03-11 NOTE — Telephone Encounter (Signed)
Done. Patient has yersinia infection causing diarrhea.

## 2016-03-12 ENCOUNTER — Encounter (INDEPENDENT_AMBULATORY_CARE_PROVIDER_SITE_OTHER): Payer: Self-pay

## 2016-03-16 ENCOUNTER — Other Ambulatory Visit: Payer: Self-pay | Admitting: Cardiovascular Disease

## 2016-03-16 ENCOUNTER — Institutional Professional Consult (permissible substitution): Payer: Medicare Other | Admitting: Emergency Medicine

## 2016-03-23 ENCOUNTER — Other Ambulatory Visit: Payer: Self-pay | Admitting: Cardiovascular Disease

## 2016-03-27 IMAGING — CR DG CHEST 1V PORT
1 series · 2 of 2 positions shown · non-contrast
Comparison: 10/14/2013

CLINICAL DATA: Rectal bleeding and weakness.

EXAM:
PORTABLE CHEST - 1 VIEW

[Series 1: portable · 0.17mm/px · 2 of 2 slices shown]
[im 1/2]
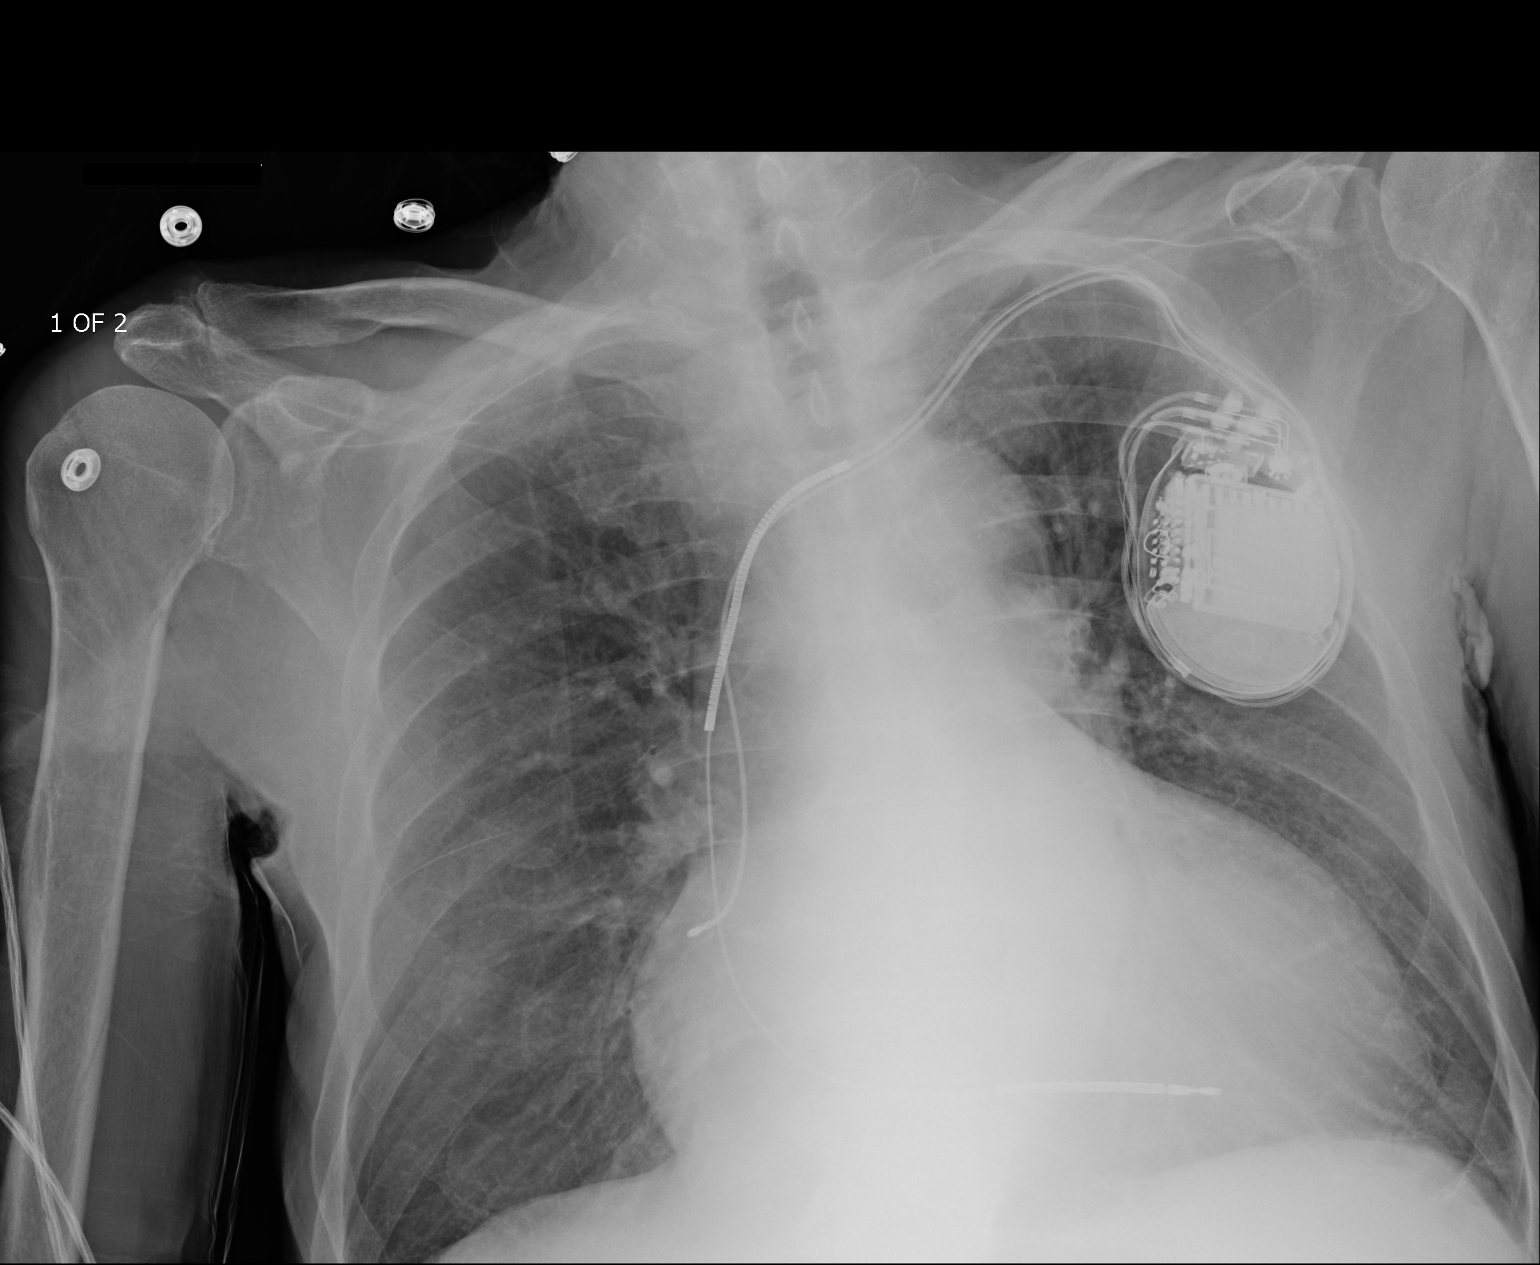
[im 2/2]
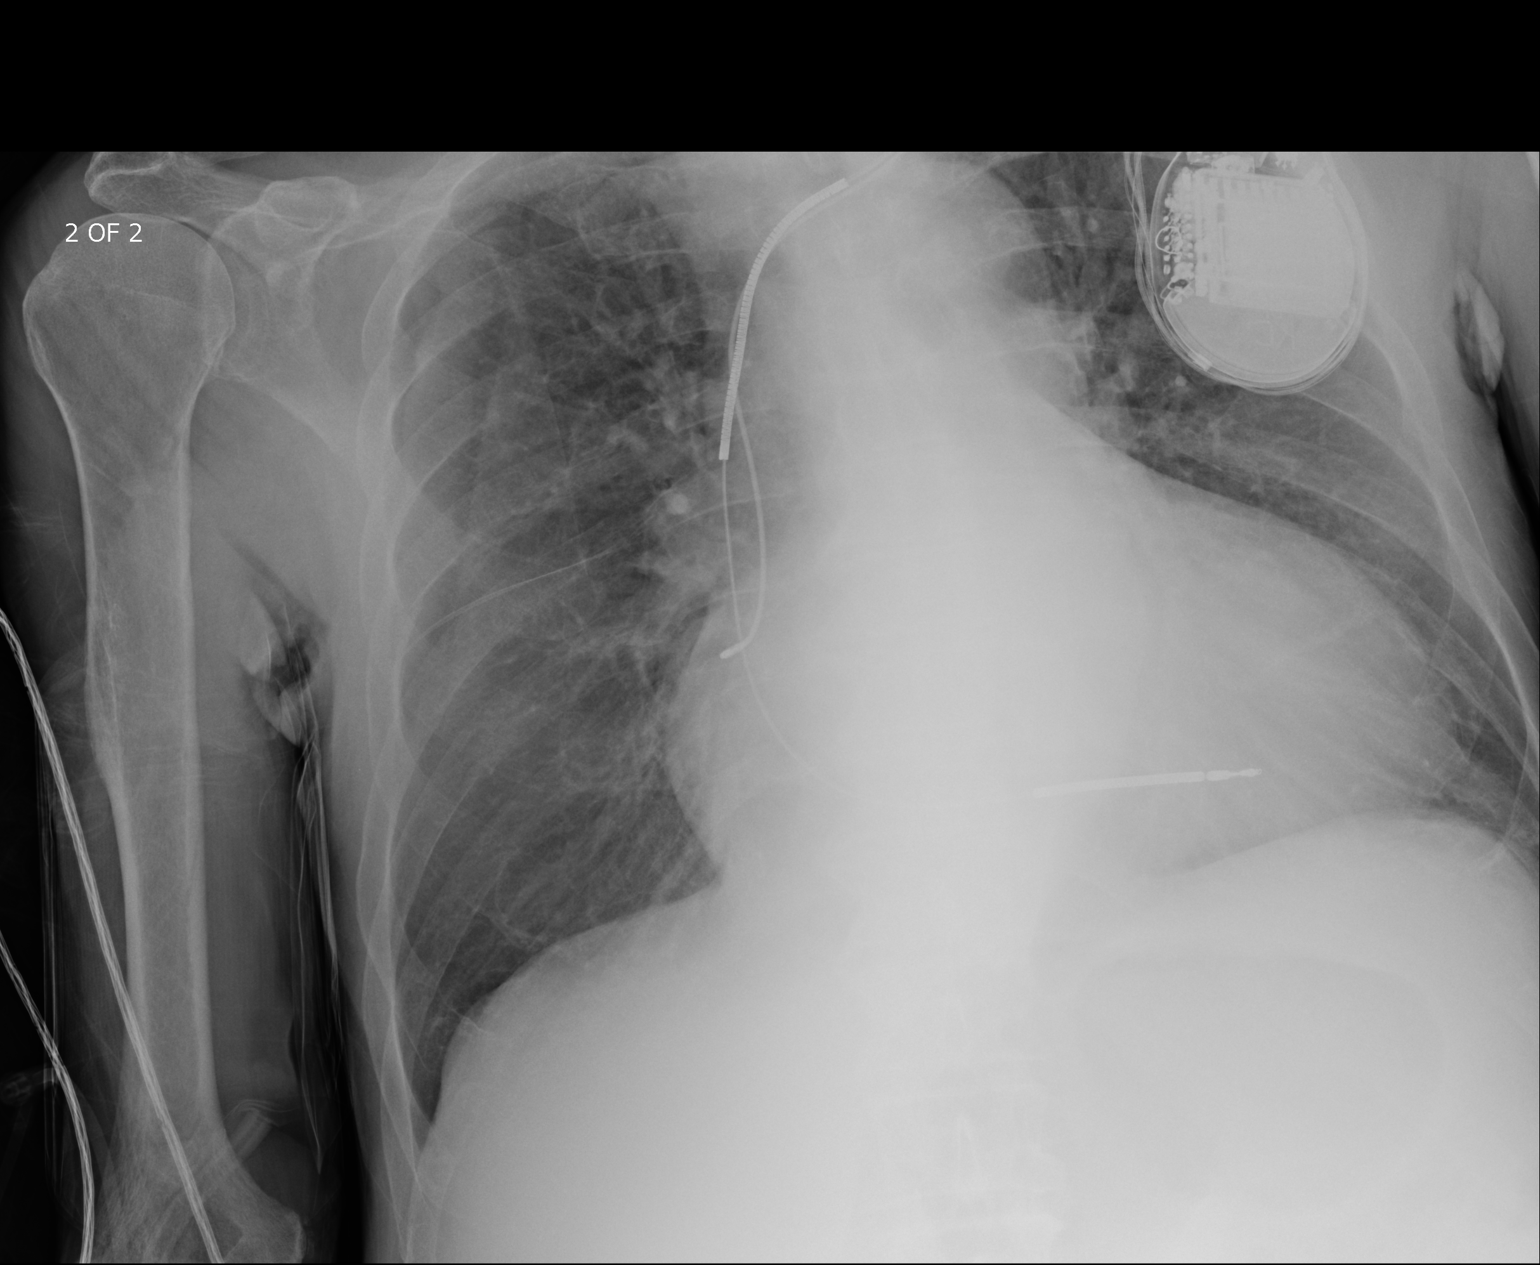

[2 of 2 positions shown; findings below may reference images not displayed]

FINDINGS: Pacer/AICD device.  Leads at right atrium and right ventricle.

Midline trachea. Moderate cardiomegaly with a mildly tortuous
thoracic aorta. No pleural effusion or pneumothorax. No congestive
failure. No lobar consolidation.
IMPRESSION: Cardiomegaly without congestive failure.

## 2016-04-15 ENCOUNTER — Ambulatory Visit
Admission: RE | Admit: 2016-04-15 | Discharge: 2016-04-15 | Disposition: A | Discharge status: Home / Self Care | Payer: Medicare Other | Source: Ambulatory Visit | Attending: Infectious Disease | Admitting: Infectious Disease

## 2016-04-15 ENCOUNTER — Ambulatory Visit (INDEPENDENT_AMBULATORY_CARE_PROVIDER_SITE_OTHER): Payer: Medicare Other | Admitting: Infectious Disease

## 2016-04-15 ENCOUNTER — Encounter: Payer: Self-pay | Admitting: Infectious Disease

## 2016-04-15 VITALS — BP 95/61 | HR 71 | Temp 97.5°F | Wt 153.8 lb

## 2016-04-15 DIAGNOSIS — G4733 Obstructive sleep apnea (adult) (pediatric): Secondary | ICD-10-CM | POA: Diagnosis not present

## 2016-04-15 DIAGNOSIS — J329 Chronic sinusitis, unspecified: Secondary | ICD-10-CM

## 2016-04-15 DIAGNOSIS — Z95810 Presence of automatic (implantable) cardiac defibrillator: Secondary | ICD-10-CM

## 2016-04-15 DIAGNOSIS — I428 Other cardiomyopathies: Secondary | ICD-10-CM

## 2016-04-15 DIAGNOSIS — R05 Cough: Secondary | ICD-10-CM

## 2016-04-15 DIAGNOSIS — R058 Other specified cough: Secondary | ICD-10-CM

## 2016-04-15 DIAGNOSIS — J321 Chronic frontal sinusitis: Secondary | ICD-10-CM

## 2016-04-15 DIAGNOSIS — R053 Chronic cough: Secondary | ICD-10-CM

## 2016-04-15 DIAGNOSIS — I5022 Chronic systolic (congestive) heart failure: Secondary | ICD-10-CM | POA: Diagnosis not present

## 2016-04-15 HISTORY — DX: Chronic sinusitis, unspecified: J32.9

## 2016-04-15 LAB — CBC WITH DIFFERENTIAL/PLATELET
Basophils Absolute: 0 cells/uL (ref 0–200)
Basophils Relative: 0 %
EOS PCT: 2 %
Eosinophils Absolute: 200 cells/uL (ref 15–500)
HCT: 36.4 % — ABNORMAL LOW (ref 38.5–50.0)
HEMOGLOBIN: 11.7 g/dL — AB (ref 13.2–17.1)
LYMPHS ABS: 1700 {cells}/uL (ref 850–3900)
Lymphocytes Relative: 17 %
MCH: 34.5 pg — ABNORMAL HIGH (ref 27.0–33.0)
MCHC: 32.1 g/dL (ref 32.0–36.0)
MCV: 107.4 fL — ABNORMAL HIGH (ref 80.0–100.0)
MONOS PCT: 13 %
MPV: 10.7 fL (ref 7.5–12.5)
Monocytes Absolute: 1300 cells/uL — ABNORMAL HIGH (ref 200–950)
NEUTROS ABS: 6800 {cells}/uL (ref 1500–7800)
Neutrophils Relative %: 68 %
PLATELETS: 195 10*3/uL (ref 140–400)
RBC: 3.39 MIL/uL — AB (ref 4.20–5.80)
RDW: 15.5 % — ABNORMAL HIGH (ref 11.0–15.0)
WBC: 10 10*3/uL (ref 3.8–10.8)

## 2016-04-15 LAB — COMPLETE METABOLIC PANEL WITH GFR
ALBUMIN: 3.8 g/dL (ref 3.6–5.1)
ALK PHOS: 143 U/L — AB (ref 40–115)
ALT: 8 U/L — ABNORMAL LOW (ref 9–46)
AST: 17 U/L (ref 10–35)
BUN: 56 mg/dL — ABNORMAL HIGH (ref 7–25)
CALCIUM: 8.8 mg/dL (ref 8.6–10.3)
CO2: 25 mmol/L (ref 20–31)
Chloride: 104 mmol/L (ref 98–110)
Creat: 1.51 mg/dL — ABNORMAL HIGH (ref 0.70–1.11)
GFR, EST AFRICAN AMERICAN: 48 mL/min — AB (ref >=60)
GFR, EST NON AFRICAN AMERICAN: 42 mL/min — AB (ref >=60)
Glucose, Bld: 90 mg/dL (ref 65–99)
POTASSIUM: 5.3 mmol/L (ref 3.5–5.3)
Sodium: 138 mmol/L (ref 135–146)
Total Bilirubin: 1.3 mg/dL — ABNORMAL HIGH (ref 0.2–1.2)
Total Protein: 6.2 g/dL (ref 6.1–8.1)

## 2016-04-15 MED ORDER — FLUTICASONE PROPIONATE 50 MCG/ACT NA SUSP: 2.0000 | Freq: Every day | NASAL | 11 refills | Status: DC

## 2016-04-15 NOTE — Progress Notes (Signed)
Reason for consult: Chronic sinusitis that has persisted despite multiple rounds of antibiotics  Requesting physician Dr. Redmond Baseman   Subjective:    Patient ID: Mario Cline, male    DOB: 21-May-1928, 82 y.o.   MRN: [id number]  HPI  This is a very pleasant 82 year old man recently widowed after the loss of his wife to hepatocellular cancer. He has multiple medical problems including nonischemic cardiomyopathy with placement of an AICD dual-chamber pacemaker in 2009, and cardiac ejection fraction of 10-15% as measured in March 2015.  He also has obstructive sleep apnea but is not compliant with his CPAP machine which he has due the fact that he has to get up multiple times a night to urinate and finds it difficult to try to go back to sleep and put on the CPAP mask.  He was being followed by Dr. Annamaria Boots with critical care, pulmonary medicine for his sleep apnea and for what Dr. Annamaria Boots termed upper respiratory syndrome in 2015. During one of those visits the patient had been suffering from a chronic cough which Dr. Annamaria Boots thought was likely due to upper respiratory syndrome and he felt that the patient would benefit from proton pump brachytherapy. The patient was seen one more time by Dr. Annamaria Boots roughly 10 days later but there is no further follow-up with pulmonary and is unclear whether the patient did in fact take proton pump inhibitor therapy and whether this made any impact on his chronic cough. Ultimately he was thought to have sinusitis and was sent to see Dr. Redmond Baseman with ear nose and throat. Indeed on flexible endoscopic evaluation of his sinuses he did have mucopurulent material from which methicillin sensitive Staphylococcus aureus was isolated on at least one occasion.  It is my understanding that there is some structural abdomen bowel is of his sinuses but that he was not felt to be a safe operative candidate to undergo surgery from ENT. He has been on various rounds of antibiotics including Augmentin  and Bactrim which both would've been active against his methicillin sensitive Staphylococcus aureus. Despite these rounds of oral antibiotics and irrigations with mupirocin and saline twice daily of his sinuses he has persisted in having a chronic cough.  In talking to the patient with the son present it appears that much of this coughing is episodic in nature and he will have spasms of coughing which she says will improve once he coughs up some phlegm.  He does have sensation of some postnasal drip and he states that when he coughs up the material is a greenish looking phlegm.  The patient also notices that his cough is worse when he lies flat which would suggest that part of this could be related to his congestive heart failure.  He also at times notices spasming of cough after eating which would be a clue to possible gastroesophageal reflux disease induced cough.  Finally he did have a history of allergies in the past and I wonder if he has some allergic sinusitis that may be underlying some of the pathology that we are seeing.  He does not have fevers or other concerning systemic symptoms I do not think that it would be good idea to treat him with intravenous antibiotics even if he has some component of bacterial infection involved in his chronic cough.    Past Medical History:  Diagnosis Date  . AICD (automatic cardioverter/defibrillator) present 06/2007   AICD/dual-chamber pacemaker - (St. Jude)- 2/09;   . Atrial fibrillation ([place]) 04/2009  Rare brief episodes by ICD interrogation until 03/2011->persistent  . Chronic anticoagulation   . Chronic kidney disease, stage III (moderate)    Cardiorenal disease.  Marland Kitchen DJD (degenerative joint disease)   . Essential hypertension, benign   . Nonischemic cardiomyopathy ([place])    Nonobstructive CAD 1997, LVEF 10-15% March 2015  . Obstructive sleep apnea 2002   CPAP prescribed  . Orthostatic hypotension   . Sinusitis, chronic 04/15/2016    Past  Surgical History:  Procedure Laterality Date  . CARDIAC DEFIBRILLATOR PLACEMENT  06/2007  . CHOLECYSTECTOMY    . ESOPHAGOGASTRODUODENOSCOPY N/A 10/15/2013   Procedure: ESOPHAGOGASTRODUODENOSCOPY (EGD);  Surgeon: Rogene Houston, MD;  [facility];  Service: Endoscopy;  Laterality: N/A;  . FLEXIBLE SIGMOIDOSCOPY N/A 01/01/2014   Procedure: FLEXIBLE SIGMOIDOSCOPY;  Surgeon: Rogene Houston, MD;  [facility]: [facility];  Service: Endoscopy;  Laterality: N/A;  1230  . GIVENS CAPSULE STUDY N/A 10/16/2013   Procedure: GIVENS CAPSULE STUDY;  Surgeon: Rogene Houston, MD;  [facility];  Service: Endoscopy;  Laterality: N/A;  . POSTERIOR LAMINECTOMY / DECOMPRESSION LUMBAR SPINE  1985   +fusion; Dr. Pearlie Oyster  . TONSILLECTOMY      Family History  Problem Relation Age of Onset  . Heart failure Mother   . Heart attack Father   . Cancer Brother     x2      Social History   Social History  . Marital status: Married    Spouse name: N/A  . Number of children: 2  . Years of education: N/A   Occupational History  . Retired     Insurance account manager   Social History Main Topics  . Smoking status: Never Smoker  . Smokeless tobacco: Never Used  . Alcohol use 0.6 oz/week    1 Standard drinks or equivalent per week  . Drug use: No  . Sexual activity: No   Other Topics Concern  . None   Social History Narrative  . None    Allergies  Allergen Reactions  . Avelox [Moxifloxacin Hcl In Nacl] Other (See Comments)    thrush     Current Outpatient Prescriptions:  .  allopurinol (ZYLOPRIM) 100 MG tablet, Take 100 mg by mouth 2 (two) times daily., Disp: , Rfl:  .  carvedilol (COREG) 3.125 MG tablet, TAKE (1) TABLET BY MOUTH TWICE A DAY WITH A MEAL., Disp: 60 tablet, Rfl: 11 .  DIGOX 125 MCG tablet, TAKE ONE TABLET BY MOUTH ONCE DAILY., Disp: 90 tablet, Rfl: 3 .  mupirocin ointment (BACTROBAN) 2 %, As directed, Disp: , Rfl:  .  potassium chloride SA  (K-DUR,KLOR-CON) 20 MEQ tablet, TAKE 1 TABLET BY MOUTH 3 TIMES DAILY., Disp: 90 tablet, Rfl: 3 .  torsemide (DEMADEX) 20 MG tablet, TAKE 1 TABLET BY MOUTH TWICE DAILY., Disp: 180 tablet, Rfl: 1 .  fluticasone (FLONASE) 50 MCG/ACT nasal spray, Place 2 sprays into both nostrils daily., Disp: 16 g, Rfl: 11 .  sulfamethoxazole-trimethoprim (BACTRIM DS,SEPTRA DS) 800-160 MG tablet, Take 1 tablet by mouth 2 (two) times daily. (Patient not taking: Reported on 04/15/2016), Disp: 14 tablet, Rfl: 0   Review of Systems  Constitutional: Negative for chills, diaphoresis and fever.  HENT: Positive for postnasal drip. Negative for congestion, hearing loss, rhinorrhea, sinus pain, sinus pressure, sneezing, sore throat, tinnitus and trouble swallowing.   Respiratory: Positive for cough and shortness of breath. Negative for wheezing.   Cardiovascular: Positive for leg swelling. Negative for chest pain and  palpitations.  Gastrointestinal: Positive for diarrhea. Negative for abdominal pain, blood in stool, constipation, nausea and vomiting.  Genitourinary: Negative for dysuria, flank pain and hematuria.  Musculoskeletal: Negative for back pain and myalgias.  Skin: Negative for rash.  Neurological: Negative for dizziness, weakness and headaches.  Hematological: Does not bruise/bleed easily.  Psychiatric/Behavioral: Negative for suicidal ideas. The patient is not nervous/anxious.        Objective:   Physical Exam  Constitutional: He is oriented to person, place, and time. He appears well-developed and well-nourished. No distress.  HENT:  Head: Normocephalic and atraumatic.  Nose: Mucosal edema present. Right sinus exhibits no maxillary sinus tenderness and no frontal sinus tenderness. Left sinus exhibits no maxillary sinus tenderness and no frontal sinus tenderness.  Mouth/Throat: Uvula is midline, oropharynx is clear and moist and mucous membranes are normal. No oropharyngeal exudate.  Eyes: Conjunctivae and  EOM are normal. No scleral icterus.  Neck: Normal range of motion. Neck supple.  Cardiovascular: Regular rhythm.  Bradycardia present.  Exam reveals no gallop and no friction rub.   No murmur heard. Pulmonary/Chest: Effort normal. No respiratory distress. He has decreased breath sounds in the right lower field and the left lower field. He has no wheezes.  When I had Mr. Lafountaine deep briefly it induced immediately a coughing episode and is clear that he was not comfortable taking deep breaths.  Abdominal: He exhibits no distension.  Musculoskeletal: He exhibits no edema or tenderness.  Neurological: He is alert and oriented to person, place, and time. He exhibits normal muscle tone. Coordination normal.  Skin: Skin is warm and dry. No rash noted. He is not diaphoretic. No erythema. No pallor.  Psychiatric: He has a normal mood and affect. His behavior is normal. Judgment and thought content normal.          Assessment & Plan:    Chronic cough which has been thought to be driven by sinusitis:  I think the patient's chronic cough could be due to multiple problems and there are multiple confounding pathologies.  First volume has fairly severe ischemic cardiomyopathy so it would not surprise me at all that he would have coughing fits attributed to volume overload in the lungs and his episode of coughing at night when he lies flat would fit with this. He also coughed quite easily when I had him take a deep breath with [place] here in the clinic  In the past he was thought to be suffering from upper respiratory syndrome which freely can be due to acid reflux. The fact that he has had known spasms of coughing that happen after eating would also be suggestive of a acid reflux driven cough.  There is a possibility that he could have an allergic sinusitis as well as a component of the pathology and that he might benefit from an inhaled corticosteroid.  My understanding also as he does have some  structural abnormalities in his sinuses that could also be playing a role.  He could certainly be suffering from superimposed bacterial infection of his sinuses that is driving some of his cough but this is clearly not the prime reason for his persistent cough, unless there is a structural abnormality that is causing trecurrent infection and again my understanding is he not a candidate for surgery.  There is the possibility that he has silent aspiration and supper some aspiration pneumonitis and pneumonia for this reason I will check a 2-D two-view x-ray  I would not hazard placing a PICC  line in this patient and giving him intravenous antibiotics. We would pose a risk of causing a PICC line associated bacteremia which would then seated his ICD which would be an absolute disaster. Secondly he would have a risk of developing a blood clot at the site of the PICC line.  I really don't find the predominance of the symptoms to be very suggestive of a serious bacterial infection given lack of fever and other systemic symptoms. His main predominant symptom is a nagging chronic cough, and such coughs are rarely due to chronic infection and less we are dealing with an atypical mycobacteria. And this is not the differential given this patient's normal lung architecture.  I will also check a CBC and a compress metabolic panel today and look also for eosinophils on the differential.  I would like to go first Of giving him an inhaled current steroid with Flonase to see if this improves his symptoms if it does not I would then moved to the next step of giving him a trial of a proton pump inhibitor to see if this improves his cough.  Finally I do think that his known congestive heart failure could be contributing to his coughing and again the fact that when he lies flat that he gets a triggering of the cough is suggestive of this. Optimization of his ischemic cardiomyopathy is another approach that would need to make  to improve his symptoms.  We know that he is not compliant with his CPAP machine so the treated sleep apnea may be playing a role in some of his cardio respiratory problems.  There may also be however limits to what can be done to address all of these things in particular his cardio vascular disease.  I will see him back in 2 months time but I am happy to try to see him sooner or to take a different approach again our first step is to see inhaled steroid helps if this does not work I would like to attack presumptive GERD with consistent proton pump inhibitor blockade I do think he should go see his cardiologist too.  I spent greater than 44minutes with the patient including greater than 50% of time in face to face counsel of the patient and his son regarding his chronic cough his nonischemic cardiomyopathy his chronic sinusitis his obstructive sleep apnea the possibility of gastroesophageal reflux disease driven cough, possible allergic sinusitis driven cough and in the coordination of his care.  Truman Hayward MD

## 2016-04-19 ENCOUNTER — Encounter: Payer: Self-pay | Admitting: Cardiovascular Disease

## 2016-04-19 ENCOUNTER — Ambulatory Visit (INDEPENDENT_AMBULATORY_CARE_PROVIDER_SITE_OTHER): Payer: Medicare Other | Admitting: Cardiovascular Disease

## 2016-04-19 ENCOUNTER — Telehealth: Payer: Self-pay | Admitting: Infectious Disease

## 2016-04-19 VITALS — BP 104/62 | HR 82 | Ht 69.0 in | Wt 145.0 lb

## 2016-04-19 DIAGNOSIS — I5022 Chronic systolic (congestive) heart failure: Secondary | ICD-10-CM

## 2016-04-19 DIAGNOSIS — Z95810 Presence of automatic (implantable) cardiac defibrillator: Secondary | ICD-10-CM

## 2016-04-19 DIAGNOSIS — I482 Chronic atrial fibrillation, unspecified: Secondary | ICD-10-CM

## 2016-04-19 DIAGNOSIS — N183 Chronic kidney disease, stage 3 unspecified: Secondary | ICD-10-CM

## 2016-04-19 DIAGNOSIS — I429 Cardiomyopathy, unspecified: Secondary | ICD-10-CM

## 2016-04-19 DIAGNOSIS — I10 Essential (primary) hypertension: Secondary | ICD-10-CM

## 2016-04-19 DIAGNOSIS — E782 Mixed hyperlipidemia: Secondary | ICD-10-CM

## 2016-04-19 MED ORDER — TORSEMIDE 20 MG PO TABS: 20.0000 mg | ORAL_TABLET | Freq: Every day | ORAL | 3 refills | Status: DC

## 2016-04-19 NOTE — Progress Notes (Signed)
SUBJECTIVE: [name] returns for followup of end-stage heart failure. He has a history of essential hypertension, CKD (baseline Cr 2.1-2.5), chronic atrial fibrillation, OSA on CPAP and chronic systolic heart failure due to nonischemic cardiomyopathy with EF 15% s/p St Jude ICD (cath 1997 with minimal non-obstructive CAD).  He is not a candidate for anti-arrhythmic drug therapy and rhythm control. He was previously started on digoxin by Dr. Lovena Le as his heart rate was not well controlled, and is also a poor candidate for AV node ablation and biventricular pacing due to his multiple comorbidities. Also has lung cancer.  Wt 145 lbs  (147 lbs on 10/13/15)  Denies worsening exertional dyspnea and leg swelling.   Has had a chronic cough for several months and saw infectious disease. Recent chest x-ray showed left lower lobe pneumonia versus atelectasis. He also has some degree of chronic sinusitis.   Review of Systems: As per "subjective", otherwise negative.  Allergies  Allergen Reactions  . Avelox [Moxifloxacin Hcl In Nacl] Other (See Comments)    thrush    Current Outpatient Prescriptions  Medication Sig Dispense Refill  . allopurinol (ZYLOPRIM) 100 MG tablet Take 100 mg by mouth 2 (two) times daily.    . carvedilol (COREG) 3.125 MG tablet TAKE (1) TABLET BY MOUTH TWICE A DAY WITH A MEAL. 60 tablet 11  . DIGOX 125 MCG tablet TAKE ONE TABLET BY MOUTH ONCE DAILY. 90 tablet 3  . fluticasone (FLONASE) 50 MCG/ACT nasal spray Place 2 sprays into both nostrils daily. 16 g 11  . potassium chloride SA (K-DUR,KLOR-CON) 20 MEQ tablet TAKE 1 TABLET BY MOUTH 3 TIMES DAILY. 90 tablet 3  . torsemide (DEMADEX) 20 MG tablet TAKE 1 TABLET BY MOUTH TWICE DAILY. 180 tablet 1   No current facility-administered medications for this visit.     Past Medical History:  Diagnosis Date  . AICD (automatic cardioverter/defibrillator) present 06/2007   AICD/dual-chamber pacemaker - (St. Jude)- 2/09;   .  Atrial fibrillation ([place]) 04/2009   Rare brief episodes by ICD interrogation until 03/2011->persistent  . Chronic anticoagulation   . Chronic kidney disease, stage III (moderate)    Cardiorenal disease.  Marland Kitchen DJD (degenerative joint disease)   . Essential hypertension, benign   . Nonischemic cardiomyopathy ([place])    Nonobstructive CAD 1997, LVEF 10-15% March 2015  . Obstructive sleep apnea 2002   CPAP prescribed  . Orthostatic hypotension   . Sinusitis, chronic 04/15/2016    Past Surgical History:  Procedure Laterality Date  . CARDIAC DEFIBRILLATOR PLACEMENT  06/2007  . CHOLECYSTECTOMY    . ESOPHAGOGASTRODUODENOSCOPY N/A 10/15/2013   Procedure: ESOPHAGOGASTRODUODENOSCOPY (EGD);  Surgeon: Rogene Houston, MD;  [facility];  Service: Endoscopy;  Laterality: N/A;  . FLEXIBLE SIGMOIDOSCOPY N/A 01/01/2014   Procedure: FLEXIBLE SIGMOIDOSCOPY;  Surgeon: Rogene Houston, MD;  [facility];  Service: Endoscopy;  Laterality: N/A;  1230  . GIVENS CAPSULE STUDY N/A 10/16/2013   Procedure: GIVENS CAPSULE STUDY;  Surgeon: Rogene Houston, MD;  [facility];  Service: Endoscopy;  Laterality: N/A;  . POSTERIOR LAMINECTOMY / DECOMPRESSION LUMBAR SPINE  1985   +fusion; Dr. Pearlie Oyster  . TONSILLECTOMY      Social History   Social History  . Marital status: Married    Spouse name: N/A  . Number of children: 2  . Years of education: N/A   Occupational History  . Retired     Insurance account manager  Social History Main Topics  . Smoking status: Never Smoker  . Smokeless tobacco: Never Used  . Alcohol use 0.6 oz/week    1 Standard drinks or equivalent per week  . Drug use: No  . Sexual activity: No   Other Topics Concern  . Not on file   Social History Narrative  . No narrative on file     Vitals:   04/19/16 1622  BP: 104/62  Pulse: 82  SpO2: 98%  Weight: 145 lb (65.8 kg)  Height: 5\' 9"  (1.753 m)    PHYSICAL EXAM General: NAD, elderly,  frail. HEENT: Normal. Neck: No JVD. Lungs: Crackles at left base, otherwise clear. CV: Nondisplaced PMI. Irregular rhythm, normal S1/S2, +S3, soft 1/6 apical holosystolic murmur. Trace pretibial edema.  Abdomen: Soft, nontender, mild distention.  Neurologic: Alert and oriented. Psych: Normal affect. Skin: Normal. Musculoskeletal: No gross deformities. Extremities: No clubbing or cyanosis.      ECG: Most recent ECG reviewed.      ASSESSMENT AND PLAN: 1. Chronic systolic heart failure. EF 15% due to NICM. Last echo 07/2013. NYHA IIIb. Volume status appears stable. Continue torsemide 20 mg daily with extra 20 mg as needed for weight gain 3 lbs or more within 24 hours.  Currently 145  lbs indicative of cardiac cachexia. I will continue carvedilol 3.125 mg twice a day along with digoxin (will need to carefully monitor in face of CKD).  He is not on ACEI/spironolactone due to CKD. He is not a candidate for LVAD or heart transplant due to his age and multiple comorbidities.   2. Chronic Atrial fibrillation: Rate controlled. Not an anticoagulation candidate due to GI bleed.  3. CKD stage IV, Creatinine baseline 2.1-2.5, most recently 1.51 on 04/15/16.  4. OSA: Noncompliant with CPAP.   Dispo: f/u in 6 months.  Kate Sable, M.D., F.A.C.C.

## 2016-04-19 NOTE — Telephone Encounter (Signed)
Patient's creatinine is up slightly compared to prior values at [facility] gets his care elsewhere converted please have the patient check in with primary care with regards to his creatinine which went up to 1.5

## 2016-04-19 NOTE — Addendum Note (Signed)
Addended by: Barbarann Ehlers A on: 04/19/2016 04:42 PM   Modules accepted: Orders

## 2016-04-19 NOTE — Patient Instructions (Addendum)
Your physician wants you to follow-up in: 6 months Dr Virgina Jock will receive a reminder letter in the mail two months in advance. If you don't receive a letter, please call our office to schedule the follow-up appointment.   Continue Torsemide 20 mg daily     Thank you for choosing [facility] !

## 2016-04-20 ENCOUNTER — Telehealth: Payer: Self-pay | Admitting: *Deleted

## 2016-04-20 NOTE — Telephone Encounter (Signed)
thx So much Mario Cline!

## 2016-04-20 NOTE — Telephone Encounter (Signed)
-----   Message from Truman Hayward, MD sent at 04/15/2016  5:45 PM EST ----- Can we call the patient and son and inform him of the chest x-ray results but tell them that I think it's more likely an atelectasis picture but that we like to check in with them by phone roughly a week to make sure the patient is doing well, not showing a need for antibiotics for pneumonia

## 2016-04-20 NOTE — Telephone Encounter (Signed)
Patient's son notified. Myrtis Hopping

## 2016-05-12 ENCOUNTER — Telehealth: Payer: Self-pay | Admitting: *Deleted

## 2016-05-12 NOTE — Telephone Encounter (Signed)
Patient son called to advise that the Flonase he was given a month ago is not working on his cough. He advised it has gotten worse and they were told to call the office if that happened to get on a different medication as it may be GERD. Advised the patient will let the doctor know and see what he would like the patient to take. Advised to contact him on his cell number.

## 2016-05-12 NOTE — Telephone Encounter (Signed)
We can go with GERD rx with 20mg  prilosec daily, OR we can consider just going to retreat him with augmentin. I have had conversation with Dr. Janace Hoard after our clinic visit and he was very convinced that the patient does have a chronic infection. So another option we could try would be augmentin 875/125 mg bid x a month with followup with me before stopping abx

## 2016-05-13 NOTE — Telephone Encounter (Signed)
Patient son called again to find out what you recommend please advise which route to take of the three given.

## 2016-05-13 NOTE — Telephone Encounter (Signed)
Lets try the augmentin 875/125mg  twice daily for amonth and then see me

## 2016-05-14 MED ORDER — AMOXICILLIN-POT CLAVULANATE 875-125 MG PO TABS: 1.0000 | ORAL_TABLET | Freq: Two times a day (BID) | ORAL | 0 refills | Status: DC

## 2016-05-14 NOTE — Addendum Note (Signed)
Addended by: Reggy Eye on: 05/14/2016 10:28 AM   Modules accepted: Orders

## 2016-05-14 NOTE — Telephone Encounter (Signed)
Per Dr Tommy Medal called to patient son and advised him of the medication being called in. Verified pharmacy and changed office visit to 06/09/16.

## 2016-05-28 ENCOUNTER — Telehealth: Payer: Self-pay | Admitting: Cardiovascular Disease

## 2016-05-28 NOTE — Telephone Encounter (Signed)
Pt's son Harrie Jeans lvm requesting to speak w/ Dr. Marthann Schiller nurse--please give him a call @ [phone]

## 2016-05-28 NOTE — Telephone Encounter (Signed)
HR has been in the 80's vs the 70's,per Dr Bronson Ing, he said not to worry about it,son was good with that

## 2016-06-09 ENCOUNTER — Encounter: Payer: Self-pay | Admitting: Infectious Disease

## 2016-06-09 ENCOUNTER — Ambulatory Visit: Payer: Medicare Other | Admitting: Infectious Disease

## 2016-06-09 ENCOUNTER — Ambulatory Visit (INDEPENDENT_AMBULATORY_CARE_PROVIDER_SITE_OTHER): Payer: Medicare Other | Admitting: Infectious Disease

## 2016-06-09 VITALS — BP 98/63 | HR 83 | Temp 99.0°F | Ht 69.0 in | Wt 158.0 lb

## 2016-06-09 DIAGNOSIS — J441 Chronic obstructive pulmonary disease with (acute) exacerbation: Secondary | ICD-10-CM

## 2016-06-09 DIAGNOSIS — I428 Other cardiomyopathies: Secondary | ICD-10-CM

## 2016-06-09 DIAGNOSIS — R05 Cough: Secondary | ICD-10-CM | POA: Insufficient documentation

## 2016-06-09 DIAGNOSIS — I5022 Chronic systolic (congestive) heart failure: Secondary | ICD-10-CM | POA: Diagnosis not present

## 2016-06-09 DIAGNOSIS — J321 Chronic frontal sinusitis: Secondary | ICD-10-CM

## 2016-06-09 DIAGNOSIS — R053 Chronic cough: Secondary | ICD-10-CM | POA: Insufficient documentation

## 2016-06-09 DIAGNOSIS — Z95810 Presence of automatic (implantable) cardiac defibrillator: Secondary | ICD-10-CM | POA: Diagnosis not present

## 2016-06-09 HISTORY — DX: Chronic cough: R05.3

## 2016-06-09 NOTE — Progress Notes (Signed)
Chief complaint: cough   Subjective:    Patient ID: Mario Cline, male    DOB: March 20, 1929, 83 y.o.   MRN: [id number]  HPI  83 year old man recently widowed after the loss of his wife to hepatocellular cancer. He has multiple medical problems including nonischemic cardiomyopathy with placement of an AICD dual-chamber pacemaker in 2009, and cardiac ejection fraction of 10-15% as measured in March 2015.  He also has obstructive sleep apnea but is not compliant with his CPAP machine which he has due the fact that he has to get up multiple times a night to urinate and finds it difficult to try to go back to sleep and put on the CPAP mask.  He was being followed by Dr. Annamaria Boots with Critical care, pulmonary medicine for his sleep apnea and for what Dr. Annamaria Boots termed upper respiratory syndrome in 2015. During one of those visits the patient had been suffering from a chronic cough which Dr. Annamaria Boots thought was likely due to upper respiratory syndrome and he felt that the patient would benefit from proton pump brachytherapy. The patient was seen one more time by Dr. Annamaria Boots roughly 10 days later but there is no further follow-up with pulmonary and is unclear whether the patient did in fact take proton pump inhibitor therapy and whether this made any impact on his chronic cough. Ultimately he was thought to have sinusitis and was sent to see Dr. Redmond Baseman with ear nose and throat. Indeed on flexible endoscopic evaluation of his sinuses he did have mucopurulent material from which methicillin sensitive Staphylococcus aureus was isolated on at least one occasion.  It is my understanding that there is some structural anatomical problem with  his sinuses but that he was not felt to be a safe operative candidate to undergo surgery from ENT.   He has been on various rounds of antibiotics including Augmentin and Bactrim which both would've been active against his methicillin sensitive Staphylococcus aureus. Despite these  rounds of oral antibiotics and irrigations with mupirocin and saline twice daily of his sinuses he has persisted in having a chronic cough.  In talking to the patient with the son present it appears that much of this coughing is episodic in nature and he will have spasms of coughing which she says will improve once he coughs up some phlegm.  He does have sensation of some postnasal drip and he states that when he coughs up the material is a greenish looking phlegm.  The patient also notices that his cough is worse when he lies flat which would suggest that part of this could be related to his congestive heart failure.  He also at times notices spasming of cough after eating which would be a clue to possible gastroesophageal reflux disease induced cough.  Finally he did have a history of allergies in the past and I wonder if he has some allergic sinusitis that may be underlying some of the pathology that we are seeing.  I first saw him I was not completely satisfied that noninfectious causes of his cough had been eliminated. However I spoke with Dr. Redmond Baseman by telephone and he informed me that patient had artery been treated with inhaled corticosteroids doubt significant improvement my understanding was also that proton pump inhibitor therapy had been tried although I don't have documentation of this. Dr. Redmond Baseman really felt the patient had evidence of significant bacterial sinus infection that again would've ordinarily been treated with him performing surgery.  In the interim the patient  tried inhaled corticosteroids without much improvement and then had a flare of coughing and called my office I recommended he start on Augmentin and the patient has taken nearly a month of Augmentin he has had improvement in his symptoms of coughing at night in particular but still coughs during the day but with less fitful coughs then he has had in the past.  He has roughly 3 pills left in his prescription.    Past  Medical History:  Diagnosis Date  . AICD (automatic cardioverter/defibrillator) present 06/2007   AICD/dual-chamber pacemaker - (St. Jude)- 2/09;   . Atrial fibrillation ([place]) 04/2009   Rare brief episodes by ICD interrogation until 03/2011->persistent  . Chronic anticoagulation   . Chronic kidney disease, stage III (moderate)    Cardiorenal disease.  Marland Kitchen DJD (degenerative joint disease)   . Essential hypertension, benign   . Nonischemic cardiomyopathy ([place])    Nonobstructive CAD 1997, LVEF 10-15% March 2015  . Obstructive sleep apnea 2002   CPAP prescribed  . Orthostatic hypotension   . Sinusitis, chronic 04/15/2016    Past Surgical History:  Procedure Laterality Date  . CARDIAC DEFIBRILLATOR PLACEMENT  06/2007  . CHOLECYSTECTOMY    . ESOPHAGOGASTRODUODENOSCOPY N/A 10/15/2013   Procedure: ESOPHAGOGASTRODUODENOSCOPY (EGD);  Surgeon: Rogene Houston, MD;  [facility];  Service: Endoscopy;  Laterality: N/A;  . FLEXIBLE SIGMOIDOSCOPY N/A 01/01/2014   Procedure: FLEXIBLE SIGMOIDOSCOPY;  Surgeon: Rogene Houston, MD;  [facility];  Service: Endoscopy;  Laterality: N/A;  1230  . GIVENS CAPSULE STUDY N/A 10/16/2013   Procedure: GIVENS CAPSULE STUDY;  Surgeon: Rogene Houston, MD;  [facility];  Service: Endoscopy;  Laterality: N/A;  . POSTERIOR LAMINECTOMY / DECOMPRESSION LUMBAR SPINE  1985   +fusion; Dr. Pearlie Oyster  . TONSILLECTOMY      Family History  Problem Relation Age of Onset  . Heart failure Mother   . Heart attack Father   . Cancer Brother     x2      Social History   Social History  . Marital status: Married    Spouse name: N/A  . Number of children: 2  . Years of education: N/A   Occupational History  . Retired     Insurance account manager   Social History Main Topics  . Smoking status: Never Smoker  . Smokeless tobacco: Never Used  . Alcohol use 0.6 oz/week    1 Standard drinks or equivalent per week  . Drug use: No    . Sexual activity: No   Other Topics Concern  . Not on file   Social History Narrative  . No narrative on file    Allergies  Allergen Reactions  . Avelox [Moxifloxacin Hcl In Nacl] Other (See Comments)    thrush     Current Outpatient Prescriptions:  .  allopurinol (ZYLOPRIM) 100 MG tablet, Take 100 mg by mouth 2 (two) times daily., Disp: , Rfl:  .  amoxicillin-clavulanate (AUGMENTIN) 875-125 MG tablet, Take 1 tablet by mouth 2 (two) times daily., Disp: 60 tablet, Rfl: 0 .  carvedilol (COREG) 3.125 MG tablet, TAKE (1) TABLET BY MOUTH TWICE A DAY WITH A MEAL., Disp: 60 tablet, Rfl: 11 .  DIGOX 125 MCG tablet, TAKE ONE TABLET BY MOUTH ONCE DAILY., Disp: 90 tablet, Rfl: 3 .  fluticasone (FLONASE) 50 MCG/ACT nasal spray, Place 2 sprays into both nostrils daily., Disp: 16 g, Rfl: 11 .  potassium chloride SA (K-DUR,KLOR-CON) 20 MEQ tablet, TAKE  1 TABLET BY MOUTH 3 TIMES DAILY., Disp: 90 tablet, Rfl: 3 .  torsemide (DEMADEX) 20 MG tablet, Take 1 tablet (20 mg total) by mouth daily., Disp: 90 tablet, Rfl: 3   Review of Systems  Constitutional: Negative for chills, diaphoresis and fever.  HENT: Positive for postnasal drip. Negative for congestion, hearing loss, rhinorrhea, sinus pain, sinus pressure, sneezing, sore throat, tinnitus and trouble swallowing.   Respiratory: Positive for cough. Negative for wheezing.   Cardiovascular: Negative for chest pain and palpitations.  Gastrointestinal: Negative for abdominal pain, blood in stool, constipation, nausea and vomiting.  Genitourinary: Negative for dysuria, flank pain and hematuria.  Musculoskeletal: Negative for back pain and myalgias.  Skin: Negative for rash.  Neurological: Negative for dizziness, weakness and headaches.  Hematological: Does not bruise/bleed easily.  Psychiatric/Behavioral: Negative for suicidal ideas. The patient is not nervous/anxious.        Objective:   Physical Exam  Constitutional: He is oriented to person,  place, and time. He appears well-developed and well-nourished. No distress.  HENT:  Head: Normocephalic and atraumatic.  Nose: Mucosal edema present. Right sinus exhibits no maxillary sinus tenderness and no frontal sinus tenderness. Left sinus exhibits no maxillary sinus tenderness and no frontal sinus tenderness.  Mouth/Throat: Uvula is midline, oropharynx is clear and moist and mucous membranes are normal. No oropharyngeal exudate.  Eyes: Conjunctivae and EOM are normal. No scleral icterus.  Neck: Normal range of motion. Neck supple.  Cardiovascular: Regular rhythm and normal heart sounds.  Bradycardia present.  Exam reveals no gallop and no friction rub.   No murmur heard. Pulmonary/Chest: Effort normal. No respiratory distress. He has no wheezes.  When I had Mr. Milford deep briefly it induced immediately a coughing episode and is clear that he was not comfortable taking deep breaths.  Abdominal: He exhibits no distension.  Musculoskeletal: He exhibits no edema or tenderness.  Neurological: He is alert and oriented to person, place, and time. He exhibits normal muscle tone. Coordination normal.  Skin: Skin is warm and dry. No rash noted. He is not diaphoretic. No erythema. No pallor.  Psychiatric: He has a normal mood and affect. His behavior is normal. Judgment and thought content normal.          Assessment & Plan:    Chronic cough which has been thought to be driven by sinusitis:  Seems to have had some improvement with Augmentin being extended for nearly a month.  That being said his cough is not altogether gone.  His son is going to check with Dr. Redmond Baseman with regards to whether a trial of proton pump up her therapy was indeed attempted once we have double check this with Dr. Redmond Baseman in the patient's primary care physician we will consider whether or not to give him a trial of PPI therapy if he is RE had a PPI therapy trial that made no difference then we will not embark on one but  if he has not I would like to try that next to see if it makes any difference upon his cough.  If he is RE tried and failed PPI therapy then we will continue to give him therapy with Augmentin for now. I will likely have him hold off for the next 2 weeks and see how quickly his symptoms return. I'm not opposed to him being on long-term indefinite oral antibiotics to keep this under better control I would likely use Augmentin because of its my spectrum against streptococcal species anaerobes and its  activity against methicillin sensitive staph aureus that was isolated from his sinuses before. We are going to have to weigh thebenefits here of treating  versus the risks of the patient requiring resistant bacteria and succumbing to C. difficile colitis.  As mentioned in my last note I also wonder, the patient's known heart failure and sleep apnea may be contribute to his symptoms  I spent greater than 25 minutes with the patient including greater than 50% of time in face to face counsel of the patient and his son regarding his chronic cough his nonischemic cardiomyopathy his chronic sinusitis his obstructive sleep apnea the possibility of gastroesophageal reflux disease driven cough, possible allergic sinusitis driven cough vs chronic sinusitis due to bacterial pathogen, vs recurrent aspiration pneumonia and in the coordination of his care.  Truman Hayward MD

## 2016-06-21 ENCOUNTER — Telehealth: Payer: Self-pay | Admitting: *Deleted

## 2016-06-21 ENCOUNTER — Other Ambulatory Visit: Payer: Self-pay | Admitting: Infectious Disease

## 2016-06-21 DIAGNOSIS — J328 Other chronic sinusitis: Secondary | ICD-10-CM

## 2016-06-21 NOTE — Telephone Encounter (Signed)
Fine to refill it

## 2016-06-21 NOTE — Telephone Encounter (Signed)
Son reports that the patient has once again started "coughing terribly" at night.  Last refill of augmentin was 05/14/16.  RN had the patient's pharmacy in [place], [facility], check their records for PPI rxes.  Only one was found in 2015 ordered by an ED.  Patient's son is requesting that Augmentin be refilled.  Return appt scheduled for 08/23/16 with Dr. Tommy Medal.  Dr. Tommy Medal please advise.

## 2016-06-22 ENCOUNTER — Telehealth: Payer: Self-pay

## 2016-06-22 NOTE — Telephone Encounter (Signed)
Patient was helped yesterday.    Tammy K King,RN

## 2016-06-28 DIAGNOSIS — I5022 Chronic systolic (congestive) heart failure: Secondary | ICD-10-CM | POA: Diagnosis not present

## 2016-06-28 DIAGNOSIS — Z79899 Other long term (current) drug therapy: Secondary | ICD-10-CM | POA: Diagnosis not present

## 2016-06-30 ENCOUNTER — Ambulatory Visit: Payer: Medicare Other | Admitting: Infectious Disease

## 2016-07-06 DIAGNOSIS — R05 Cough: Secondary | ICD-10-CM | POA: Diagnosis not present

## 2016-07-06 DIAGNOSIS — I5022 Chronic systolic (congestive) heart failure: Secondary | ICD-10-CM | POA: Diagnosis not present

## 2016-07-19 ENCOUNTER — Ambulatory Visit: Payer: Medicare Other | Admitting: Infectious Disease

## 2016-08-16 ENCOUNTER — Other Ambulatory Visit: Payer: Self-pay | Admitting: Infectious Disease

## 2016-08-16 DIAGNOSIS — J328 Other chronic sinusitis: Secondary | ICD-10-CM

## 2016-08-16 NOTE — Telephone Encounter (Signed)
Patient picked up a refill today at the pharmacy, 08/16/16.  The patient has an appt with Dr. Tommy Medal for 08/23/16 to discuss Augmentin refills.  Pharmacy notified.

## 2016-08-23 ENCOUNTER — Ambulatory Visit (INDEPENDENT_AMBULATORY_CARE_PROVIDER_SITE_OTHER): Payer: Medicare Other | Admitting: Infectious Disease

## 2016-08-23 ENCOUNTER — Encounter: Payer: Self-pay | Admitting: Infectious Disease

## 2016-08-23 VITALS — BP 97/60 | HR 80 | Temp 97.6°F | Wt 151.0 lb

## 2016-08-23 DIAGNOSIS — G4733 Obstructive sleep apnea (adult) (pediatric): Secondary | ICD-10-CM | POA: Diagnosis not present

## 2016-08-23 DIAGNOSIS — R058 Other specified cough: Secondary | ICD-10-CM

## 2016-08-23 DIAGNOSIS — J328 Other chronic sinusitis: Secondary | ICD-10-CM | POA: Diagnosis not present

## 2016-08-23 DIAGNOSIS — Z95810 Presence of automatic (implantable) cardiac defibrillator: Secondary | ICD-10-CM | POA: Diagnosis not present

## 2016-08-23 DIAGNOSIS — R05 Cough: Secondary | ICD-10-CM | POA: Diagnosis not present

## 2016-08-23 DIAGNOSIS — J320 Chronic maxillary sinusitis: Secondary | ICD-10-CM | POA: Diagnosis not present

## 2016-08-23 MED ORDER — FLUTICASONE PROPIONATE 50 MCG/ACT NA SUSP: 2.0000 | Freq: Every day | NASAL | 11 refills | Status: AC

## 2016-08-23 MED ORDER — AMOXICILLIN-POT CLAVULANATE 875-125 MG PO TABS: 1.0000 | ORAL_TABLET | Freq: Two times a day (BID) | ORAL | 7 refills | Status: DC

## 2016-08-23 NOTE — Progress Notes (Signed)
Chief complaint: cough recurring   Subjective:    Patient ID: Mario Cline, male    DOB: 1928-09-13, 83 y.o.   MRN: [id number]  HPI  83 year old man recently widowed after the loss of his wife to hepatocellular cancer. He has multiple medical problems including nonischemic cardiomyopathy with placement of an AICD dual-chamber pacemaker in 2009, and cardiac ejection fraction of 10-15% as measured in March 2015.  He also has obstructive sleep apnea but is not compliant with his CPAP machine which he has due the fact that he has to get up multiple times a night to urinate and finds it difficult to try to go back to sleep and put on the CPAP mask.  He was being followed by Dr. Annamaria Boots with Critical care, pulmonary medicine for his sleep apnea and for what Dr. Annamaria Boots termed upper respiratory syndrome in 2015. During one of those visits the patient had been suffering from a chronic cough which Dr. Annamaria Boots thought was likely due to upper respiratory syndrome and he felt that the patient would benefit from proton pump brachytherapy. The patient was seen one more time by Dr. Annamaria Boots roughly 10 days later but there is no further follow-up with pulmonary and is unclear whether the patient did in fact take proton pump inhibitor therapy and whether this made any impact on his chronic cough. Ultimately he was thought to have sinusitis and was sent to see Dr. Redmond Baseman with ear nose and throat. Indeed on flexible endoscopic evaluation of his sinuses he did have mucopurulent material from which methicillin sensitive Staphylococcus aureus was isolated on at least one occasion.  It is my understanding that there is some structural anatomical problem with  his sinuses but that he was not felt to be a safe operative candidate to undergo surgery from ENT.   He had been on various rounds of antibiotics including Augmentin and Bactrim which both would've been active against his methicillin sensitive Staphylococcus aureus. Despite  these rounds of oral antibiotics and irrigations with mupirocin and saline twice daily of his sinuses he has persisted in having a chronic cough.  In talking to the patient in past with the son present it appears that much of this coughing is episodic in nature and he will have spasms of coughing which she says will improve once he coughs up some phlegm.  He does have sensation of some postnasal drip and he states that when he coughs up the material is a greenish looking phlegm.  The patient also notices that his cough is worse when he lies flat which would suggest that part of this could be related to his congestive heart failure.  He also at times notices spasming of cough after eating which would be a clue to possible gastroesophageal reflux disease induced cough.  Finally he did have a history of allergies in the past and I wonder if he has some allergic sinusitis that may be underlying some of the pathology that we are seeing.  I first saw him I was not completely satisfied that noninfectious causes of his cough had been eliminated. However I spoke with Dr. Redmond Baseman by telephone and he informed me that patient had artery been treated with inhaled corticosteroids doubt significant improvement my understanding was also that proton pump inhibitor therapy had been tried although I don't have documentation of this. Dr. Redmond Baseman really felt the patient had evidence of significant bacterial sinus infection that again would've ordinarily been treated with him performing surgery.  In the  interim the patient tried inhaled corticosteroids without much improvement and then had a flare of coughing and called my office I recommended he start on Augmentin and the patient has taken nearly a month of Augmentin he has had improvement in his symptoms of coughing at night in particular but still coughed during the day but with less fitful coughs then he has had in the past.  Today he comes in with his son and is in midst  of a 3rd bottle of augmentin by patients account but son thinks is the 2nd one. Still coughing. Absolutely NO other sig symptoms of infection besides his cough.     Past Medical History:  Diagnosis Date  . AICD (automatic cardioverter/defibrillator) present 06/2007   AICD/dual-chamber pacemaker - (St. Jude)- 2/09;   . Atrial fibrillation ([place]) 04/2009   Rare brief episodes by ICD interrogation until 03/2011->persistent  . Chronic anticoagulation   . Chronic cough 06/09/2016  . Chronic kidney disease, stage III (moderate)    Cardiorenal disease.  Marland Kitchen DJD (degenerative joint disease)   . Essential hypertension, benign   . Nonischemic cardiomyopathy ([place])    Nonobstructive CAD 1997, LVEF 10-15% March 2015  . Obstructive sleep apnea 2002   CPAP prescribed  . Orthostatic hypotension   . Sinusitis, chronic 04/15/2016    Past Surgical History:  Procedure Laterality Date  . CARDIAC DEFIBRILLATOR PLACEMENT  06/2007  . CHOLECYSTECTOMY    . ESOPHAGOGASTRODUODENOSCOPY N/A 10/15/2013   Procedure: ESOPHAGOGASTRODUODENOSCOPY (EGD);  Surgeon: Rogene Houston, MD;  [facility];  Service: Endoscopy;  Laterality: N/A;  . FLEXIBLE SIGMOIDOSCOPY N/A 01/01/2014   Procedure: FLEXIBLE SIGMOIDOSCOPY;  Surgeon: Rogene Houston, MD;  [facility];  Service: Endoscopy;  Laterality: N/A;  1230  . GIVENS CAPSULE STUDY N/A 10/16/2013   Procedure: GIVENS CAPSULE STUDY;  Surgeon: Rogene Houston, MD;  [facility];  Service: Endoscopy;  Laterality: N/A;  . POSTERIOR LAMINECTOMY / DECOMPRESSION LUMBAR SPINE  1985   +fusion; Dr. Pearlie Oyster  . TONSILLECTOMY      Family History  Problem Relation Age of Onset  . Heart failure Mother   . Heart attack Father   . Cancer Brother     x2      Social History   Social History  . Marital status: Married    Spouse name: N/A  . Number of children: 2  . Years of education: N/A   Occupational History  . Retired     Adult nurse   Social History Main Topics  . Smoking status: Never Smoker  . Smokeless tobacco: Never Used  . Alcohol use 0.6 oz/week    1 Standard drinks or equivalent per week  . Drug use: No  . Sexual activity: No   Other Topics Concern  . Not on file   Social History Narrative  . No narrative on file    Allergies  Allergen Reactions  . Avelox [Moxifloxacin Hcl In Nacl] Other (See Comments)    thrush     Current Outpatient Prescriptions:  .  allopurinol (ZYLOPRIM) 100 MG tablet, Take 100 mg by mouth 2 (two) times daily., Disp: , Rfl:  .  amoxicillin-clavulanate (AUGMENTIN) 875-125 MG tablet, TAKE (1) TABLET BY MOUTH TWICE DAILY., Disp: 60 tablet, Rfl: 1 .  carvedilol (COREG) 3.125 MG tablet, TAKE (1) TABLET BY MOUTH TWICE A DAY WITH A MEAL., Disp: 60 tablet, Rfl: 11 .  DIGOX 125 MCG tablet, TAKE ONE TABLET BY MOUTH ONCE  DAILY., Disp: 90 tablet, Rfl: 3 .  fluticasone (FLONASE) 50 MCG/ACT nasal spray, Place 2 sprays into both nostrils daily. (Patient not taking: Reported on 06/09/2016), Disp: 16 g, Rfl: 11 .  potassium chloride SA (K-DUR,KLOR-CON) 20 MEQ tablet, TAKE 1 TABLET BY MOUTH 3 TIMES DAILY., Disp: 90 tablet, Rfl: 3 .  torsemide (DEMADEX) 20 MG tablet, Take 1 tablet (20 mg total) by mouth daily., Disp: 90 tablet, Rfl: 3   Review of Systems  Constitutional: Negative for chills, diaphoresis and fever.  HENT: Positive for postnasal drip. Negative for congestion, hearing loss, rhinorrhea, sinus pain, sinus pressure, sneezing, sore throat, tinnitus and trouble swallowing.   Respiratory: Positive for cough. Negative for wheezing.   Cardiovascular: Negative for chest pain and palpitations.  Gastrointestinal: Negative for abdominal pain, blood in stool, constipation, nausea and vomiting.  Genitourinary: Negative for dysuria, flank pain and hematuria.  Musculoskeletal: Negative for back pain and myalgias.  Skin: Negative for rash.  Neurological: Negative for dizziness,  weakness and headaches.  Hematological: Does not bruise/bleed easily.  Psychiatric/Behavioral: Negative for suicidal ideas. The patient is not nervous/anxious.        Objective:   Physical Exam  Constitutional: He is oriented to person, place, and time. He appears well-developed and well-nourished. No distress.  HENT:  Head: Normocephalic and atraumatic.  Nose: Mucosal edema present. Right sinus exhibits no maxillary sinus tenderness and no frontal sinus tenderness. Left sinus exhibits no maxillary sinus tenderness and no frontal sinus tenderness.  Mouth/Throat: Uvula is midline, oropharynx is clear and moist and mucous membranes are normal. No oropharyngeal exudate.  Eyes: Conjunctivae and EOM are normal. No scleral icterus.  Neck: Normal range of motion. Neck supple.  Cardiovascular: Regular rhythm and normal heart sounds.  Bradycardia present.  Exam reveals no gallop and no friction rub.   No murmur heard. Pulmonary/Chest: Effort normal. No respiratory distress. He has no wheezes.  When I had Mr. Gales deep briefly it induced immediately a coughing episode and is clear that he was not comfortable taking deep breaths.  Abdominal: He exhibits no distension.  Musculoskeletal: He exhibits no edema or tenderness.  Neurological: He is alert and oriented to person, place, and time. He exhibits normal muscle tone. Coordination normal.  Skin: Skin is warm and dry. No rash noted. He is not diaphoretic. No erythema. No pallor.  Psychiatric: He has a normal mood and affect. His behavior is normal. Judgment and thought content normal.          Assessment & Plan:    Chronic cough which has been thought to be driven by sinusitis:  Seems to have had some improvement with Augmentin being extended for nearly a month  That being said his cough is not altogether gone and it recurred again today.  SOn checked with Dr. Redmond Baseman who said patient had course of PPI withiout improvement.   He is NOT  compliant with OSA treatment  I would like him to cut back to 2 week courses of augmentin. I would also like him to try the [place] steroid for longer period of time and see Pulmonary in addition to myself and Dr. Redmond Baseman  I spent greater than 25 minutes with the patient including greater than 50% of time in face to face counsel of the patient and his son regarding his chronic cough  his chronic sinusitis his obstructive sleep apnea the possibility of gastroesophageal reflux disease driven cough, possible allergic sinusitis driven cough vs chronic sinusitis due to bacterial pathogen, vs  recurrent aspiration pneumonia and in the coordination of his care.  Truman Hayward MD

## 2016-08-25 ENCOUNTER — Other Ambulatory Visit (HOSPITAL_COMMUNITY): Payer: Self-pay | Admitting: Internal Medicine

## 2016-08-25 DIAGNOSIS — M79605 Pain in left leg: Secondary | ICD-10-CM | POA: Diagnosis not present

## 2016-08-25 DIAGNOSIS — M79662 Pain in left lower leg: Secondary | ICD-10-CM

## 2016-08-26 ENCOUNTER — Ambulatory Visit (HOSPITAL_COMMUNITY)
Admission: RE | Admit: 2016-08-26 | Discharge: 2016-08-26 | Disposition: A | Discharge status: Home / Self Care | Payer: Medicare Other | Source: Ambulatory Visit | Attending: Internal Medicine | Admitting: Internal Medicine

## 2016-08-26 DIAGNOSIS — M7989 Other specified soft tissue disorders: Secondary | ICD-10-CM | POA: Insufficient documentation

## 2016-08-26 DIAGNOSIS — M79662 Pain in left lower leg: Secondary | ICD-10-CM | POA: Insufficient documentation

## 2016-08-26 DIAGNOSIS — L538 Other specified erythematous conditions: Secondary | ICD-10-CM | POA: Diagnosis not present

## 2016-08-26 DIAGNOSIS — D225 Melanocytic nevi of trunk: Secondary | ICD-10-CM | POA: Diagnosis not present

## 2016-08-30 ENCOUNTER — Ambulatory Visit (INDEPENDENT_AMBULATORY_CARE_PROVIDER_SITE_OTHER): Payer: Medicare Other | Admitting: Cardiovascular Disease

## 2016-08-30 ENCOUNTER — Encounter: Payer: Self-pay | Admitting: Cardiovascular Disease

## 2016-08-30 ENCOUNTER — Telehealth: Payer: Self-pay | Admitting: Cardiovascular Disease

## 2016-08-30 ENCOUNTER — Other Ambulatory Visit: Payer: Self-pay | Admitting: Cardiovascular Disease

## 2016-08-30 VITALS — BP 90/58 | HR 69 | Ht 69.0 in | Wt 149.0 lb

## 2016-08-30 DIAGNOSIS — Z95810 Presence of automatic (implantable) cardiac defibrillator: Secondary | ICD-10-CM | POA: Diagnosis not present

## 2016-08-30 DIAGNOSIS — I10 Essential (primary) hypertension: Secondary | ICD-10-CM | POA: Diagnosis not present

## 2016-08-30 DIAGNOSIS — I482 Chronic atrial fibrillation, unspecified: Secondary | ICD-10-CM

## 2016-08-30 DIAGNOSIS — N184 Chronic kidney disease, stage 4 (severe): Secondary | ICD-10-CM | POA: Diagnosis not present

## 2016-08-30 DIAGNOSIS — N19 Unspecified kidney failure: Secondary | ICD-10-CM | POA: Diagnosis not present

## 2016-08-30 DIAGNOSIS — I480 Paroxysmal atrial fibrillation: Secondary | ICD-10-CM | POA: Diagnosis not present

## 2016-08-30 DIAGNOSIS — Z79899 Other long term (current) drug therapy: Secondary | ICD-10-CM | POA: Diagnosis not present

## 2016-08-30 DIAGNOSIS — R600 Localized edema: Secondary | ICD-10-CM | POA: Diagnosis not present

## 2016-08-30 DIAGNOSIS — I5023 Acute on chronic systolic (congestive) heart failure: Secondary | ICD-10-CM

## 2016-08-30 DIAGNOSIS — I429 Cardiomyopathy, unspecified: Secondary | ICD-10-CM | POA: Diagnosis not present

## 2016-08-30 MED ORDER — METOLAZONE 2.5 MG PO TABS: ORAL_TABLET | ORAL | 0 refills | Status: AC

## 2016-08-30 NOTE — Telephone Encounter (Signed)
Apt made for today 

## 2016-08-30 NOTE — Progress Notes (Signed)
SUBJECTIVE: Mario Cline returns for followup of end-stage heart failure. He has a history of essential hypertension, CKD (baseline Cr 2.1-2.5), chronic atrial fibrillation, OSA on CPAP and chronic systolic heart failure due to nonischemic cardiomyopathy with EF 15% s/p St Jude ICD (cath 1997 with minimal non-obstructive CAD).  He is not a candidate for anti-arrhythmic drug therapy and rhythm control. He was previously started on digoxin by Dr. Lovena Le as his heart rate was not well controlled, and is also a poor candidate for AV node ablation and biventricular pacing due to his multiple comorbidities. Also has lung cancer.  Wt 149 lbs (145 on 04/19/16)  His PCP, Dr. Willey Blade, increased torsemide 40 mg for 3 days. Today is the second day. He said that he weighed 146 pounds on his home scale yesterday and was 144 pounds today.  He has chronic exertional dyspnea which is unchanged.  He has bilateral leg swelling, left greater than right.  Lower extremity Dopplers were negative for DVT in left leg.  He continues to struggle with a chronic cough.  BUN 56, CREATININE 1.51 ON 04/15/16.   Review of Systems: As per "subjective", otherwise negative.  Allergies  Allergen Reactions  . Avelox [Moxifloxacin Hcl In Nacl] Other (See Comments)    thrush    Current Outpatient Prescriptions  Medication Sig Dispense Refill  . allopurinol (ZYLOPRIM) 100 MG tablet Take 100 mg by mouth 2 (two) times daily.    . carvedilol (COREG) 3.125 MG tablet TAKE (1) TABLET BY MOUTH TWICE A DAY WITH A MEAL. 60 tablet 11  . DIGOX 125 MCG tablet TAKE ONE TABLET BY MOUTH ONCE DAILY. 90 tablet 3  . fluticasone (FLONASE) 50 MCG/ACT nasal spray Place 2 sprays into both nostrils daily. 16 g 11  . potassium chloride SA (K-DUR,KLOR-CON) 20 MEQ tablet TAKE 1 TABLET BY MOUTH 3 TIMES DAILY. 90 tablet 3  . torsemide (DEMADEX) 20 MG tablet Take 20 mg by mouth daily.    Marland Kitchen torsemide (DEMADEX) 20 MG tablet Take 1 tablet (20 mg  total) by mouth daily. 90 tablet 3   No current facility-administered medications for this visit.     Past Medical History:  Diagnosis Date  . AICD (automatic cardioverter/defibrillator) present 06/2007   AICD/dual-chamber pacemaker - (St. Jude)- 2/09;   . Atrial fibrillation ([place]) 04/2009   Rare brief episodes by ICD interrogation until 03/2011->persistent  . Chronic anticoagulation   . Chronic cough 06/09/2016  . Chronic kidney disease, stage III (moderate)    Cardiorenal disease.  Marland Kitchen DJD (degenerative joint disease)   . Essential hypertension, benign   . Nonischemic cardiomyopathy ([place])    Nonobstructive CAD 1997, LVEF 10-15% March 2015  . Obstructive sleep apnea 2002   CPAP prescribed  . Orthostatic hypotension   . Sinusitis, chronic 04/15/2016    Past Surgical History:  Procedure Laterality Date  . CARDIAC DEFIBRILLATOR PLACEMENT  06/2007  . CHOLECYSTECTOMY    . ESOPHAGOGASTRODUODENOSCOPY N/A 10/15/2013   Procedure: ESOPHAGOGASTRODUODENOSCOPY (EGD);  Surgeon: Rogene Houston, MD;  Location: [facility];  Service: Endoscopy;  Laterality: N/A;  . FLEXIBLE SIGMOIDOSCOPY N/A 01/01/2014   Procedure: FLEXIBLE SIGMOIDOSCOPY;  Surgeon: Rogene Houston, MD;  Location: [facility];  Service: Endoscopy;  Laterality: N/A;  1230  . GIVENS CAPSULE STUDY N/A 10/16/2013   Procedure: GIVENS CAPSULE STUDY;  Surgeon: Rogene Houston, MD;  Location: [facility];  Service: Endoscopy;  Laterality: N/A;  . POSTERIOR LAMINECTOMY / DECOMPRESSION LUMBAR SPINE  1985   +fusion; Dr. Pearlie Oyster  . TONSILLECTOMY      Social History   Social History  . Marital status: Widowed    Spouse name: N/A  . Number of children: 2  . Years of education: N/A   Occupational History  . Retired     Insurance account manager   Social History Main Topics  . Smoking status: Never Smoker  . Smokeless tobacco: Never Used  . Alcohol use 0.6 oz/week    1 Standard drinks or equivalent per week  . Drug  use: No  . Sexual activity: No   Other Topics Concern  . Not on file   Social History Narrative  . No narrative on file     Vitals:   08/30/16 1332  BP: (!) 90/58  Pulse: 69  SpO2: 99%  Weight: 149 lb (67.6 kg)  Height: 5\' 9"  (1.753 m)    Wt Readings from Last 3 Encounters:  08/30/16 149 lb (67.6 kg)  08/23/16 151 lb (68.5 kg)  06/09/16 158 lb (71.7 kg)     PHYSICAL EXAM General: NAD HEENT: Normal. Neck: No JVD, no thyromegaly. Lungs: Crackles at left base. AP:OLIDCVU rate and rhythm, normal S1/S2, +S3, soft 1/6 apical holosystolic murmur. 1-2+ pitting pretibial edema, left>right.  Abdomen: Soft, nontender, no distention.  Neurologic: Alert and oriented.  Psych: Normal affect. Skin: Normal. Musculoskeletal: No gross deformities.    ECG: Most recent ECG reviewed.   Labs: Lab Results  Component Value Date/Time   K 5.3 04/15/2016 04:41 PM   BUN 56 (H) 04/15/2016 04:41 PM   BUN 43 (H) 11/29/2014 01:49 PM   CREATININE 1.51 (H) 04/15/2016 04:41 PM   ALT 8 (L) 04/15/2016 04:41 PM   TSH 2.120 10/15/2013 01:16 AM   HGB 11.7 (L) 04/15/2016 04:41 PM     Lipids: Lab Results  Component Value Date/Time   LDLCALC 101 09/17/2009 01:21 PM   CHOL 159 09/17/2009 01:21 PM   TRIG 120 09/17/2009 01:21 PM   HDL 34 09/17/2009 01:21 PM       ASSESSMENT AND PLAN: 1. Acute on chronic systolic heart failure. EF 15% due to NICM. Last echo 07/2013. He is taking torsemide 40 mg daily for 3 days, tomorrow being the final day. I will prescribe metolazone 2.5 mg to be taken today and tomorrow. He is due for follow-up blood tests and will see his PCP next week. He is not on an ACE inhibitor or spironolactone due to CKD. He is not a candidate for LVAD or heart transplant due to age and multiple comorbidities. Continue Coreg and digoxin.  2. Chronic Atrial fibrillation: Rate controlled. Not an anticoagulation candidate due to GI bleed. Continue Coreg and digoxin.  3. CKD stage  IV, Creatinine baseline 2.1-2.5, most recently 1.51 on 04/15/16. Due for repeat blood tests later this week.  4. OSA: Noncompliant with CPAP.   5. ICD: No shocks.   Disposition: Follow up 4 months  Kate Sable, M.D., F.A.C.C.

## 2016-08-30 NOTE — Telephone Encounter (Signed)
Would like to speak with nurse regarding patient's legs swelling. / tg

## 2016-08-30 NOTE — Patient Instructions (Signed)
Your physician recommends that you schedule a follow-up appointment in: 4 months with Dr Bronson Ing    Take Metolazone 2.5 mg today and tomorrow , the stop           Thank you for choosing [facility] !

## 2016-09-01 ENCOUNTER — Telehealth: Payer: Self-pay | Admitting: Cardiovascular Disease

## 2016-09-01 DIAGNOSIS — R404 Transient alteration of awareness: Secondary | ICD-10-CM | POA: Diagnosis not present

## 2016-09-01 DIAGNOSIS — I872 Venous insufficiency (chronic) (peripheral): Secondary | ICD-10-CM

## 2016-09-02 ENCOUNTER — Telehealth: Payer: Self-pay | Admitting: Cardiovascular Disease

## 2016-09-02 NOTE — Telephone Encounter (Signed)
Very sorry to hear that. Thank you for letting me know.

## 2016-09-02 NOTE — Telephone Encounter (Signed)
Mr. Gabrielson' son Harrie Jeans called wanting to let Dr. Bronson Ing know the pt passed away last night.

## 2016-09-02 NOTE — Telephone Encounter (Signed)
Will forward to Dr. Koneswaran  

## 2016-09-07 DIAGNOSIS — 419620001 Death: Secondary | SNOMED CT | POA: Diagnosis not present

## 2016-09-07 NOTE — Telephone Encounter (Signed)
Called Chuck back, no answer. Left message to call back.

## 2016-09-07 NOTE — Telephone Encounter (Signed)
Patient's son would like to speak with nurse regarding patient's symptoms.  tg  °

## 2016-09-07 NOTE — Telephone Encounter (Signed)
Returned pt's son Chuck's call. No answer. Left message for him to return call.

## 2016-09-07 NOTE — Telephone Encounter (Signed)
No, I would not. There has been substantial weight loss already. He does not have DVT as confirmed by Dopplers. This may be due to chronic venous stasis due to venous reflux disease. Have him see vascular to obtain their opinion.

## 2016-09-07 NOTE — Telephone Encounter (Signed)
Spoke with pt's son. Explained that a referral was placed for [facility]. They will contact with appointment. He voiced understanding.

## 2016-09-07 NOTE — Telephone Encounter (Signed)
Pt's son returned my call. He states that since seeing Dr. Bronson Ing last Monday, his father has lost about 6 lbs. ( down from 146 to 139.6) He states that he took the metolazone like asked, but his father's left leg is still swollen. He does not see his PCP until next week and was wondering if he should take another fluid pill to try to get the swelling down in his leg. Please advise.

## 2016-09-07 DEATH — deceased

## 2016-09-27 ENCOUNTER — Institutional Professional Consult (permissible substitution): Payer: Medicare Other | Admitting: Internal Medicine

## 2016-10-15 ENCOUNTER — Ambulatory Visit: Payer: Medicare Other | Admitting: Cardiovascular Disease

## 2016-12-21 ENCOUNTER — Ambulatory Visit: Payer: Medicare Other | Admitting: Cardiovascular Disease
